# Patient Record
Sex: Female | Born: 1975 | Race: Black or African American | Hispanic: No | Marital: Married | Smoking: Never smoker
Health system: Southern US, Community
[De-identification: ages and names within clinical notes are randomized; demographics above are authoritative.]

## PROBLEM LIST (undated history)

## (undated) DIAGNOSIS — J45909 Unspecified asthma, uncomplicated: Secondary | ICD-10-CM

## (undated) DIAGNOSIS — F32A Depression, unspecified: Secondary | ICD-10-CM

## (undated) DIAGNOSIS — K8020 Calculus of gallbladder without cholecystitis without obstruction: Secondary | ICD-10-CM

## (undated) DIAGNOSIS — D1803 Hemangioma of intra-abdominal structures: Secondary | ICD-10-CM

## (undated) DIAGNOSIS — K219 Gastro-esophageal reflux disease without esophagitis: Secondary | ICD-10-CM

## (undated) DIAGNOSIS — K602 Anal fissure, unspecified: Secondary | ICD-10-CM

## (undated) DIAGNOSIS — K589 Irritable bowel syndrome without diarrhea: Secondary | ICD-10-CM

## (undated) DIAGNOSIS — F329 Major depressive disorder, single episode, unspecified: Secondary | ICD-10-CM

## (undated) DIAGNOSIS — M1990 Unspecified osteoarthritis, unspecified site: Secondary | ICD-10-CM

## (undated) DIAGNOSIS — I10 Essential (primary) hypertension: Secondary | ICD-10-CM

## (undated) DIAGNOSIS — T7840XA Allergy, unspecified, initial encounter: Secondary | ICD-10-CM

## (undated) DIAGNOSIS — R7303 Prediabetes: Secondary | ICD-10-CM

## (undated) DIAGNOSIS — J189 Pneumonia, unspecified organism: Secondary | ICD-10-CM

## (undated) DIAGNOSIS — IMO0002 Reserved for concepts with insufficient information to code with codable children: Secondary | ICD-10-CM

## (undated) DIAGNOSIS — K5790 Diverticulosis of intestine, part unspecified, without perforation or abscess without bleeding: Secondary | ICD-10-CM

## (undated) DIAGNOSIS — K921 Melena: Secondary | ICD-10-CM

## (undated) HISTORY — DX: Gastro-esophageal reflux disease without esophagitis: K21.9

## (undated) HISTORY — DX: Irritable bowel syndrome, unspecified: K58.9

## (undated) HISTORY — DX: Melena: K92.1

## (undated) HISTORY — DX: Unspecified osteoarthritis, unspecified site: M19.90

## (undated) HISTORY — DX: Diverticulosis of intestine, part unspecified, without perforation or abscess without bleeding: K57.90

## (undated) HISTORY — DX: Reserved for concepts with insufficient information to code with codable children: IMO0002

## (undated) HISTORY — DX: Essential (primary) hypertension: I10

## (undated) HISTORY — DX: Unspecified asthma, uncomplicated: J45.909

## (undated) HISTORY — DX: Anal fissure, unspecified: K60.2

## (undated) HISTORY — DX: Major depressive disorder, single episode, unspecified: F32.9

## (undated) HISTORY — PX: WISDOM TOOTH EXTRACTION: SHX21

## (undated) HISTORY — DX: Depression, unspecified: F32.A

## (undated) HISTORY — DX: Allergy, unspecified, initial encounter: T78.40XA

---

## 2002-06-19 ENCOUNTER — Emergency Department (HOSPITAL_COMMUNITY): Admission: EM | Admit: 2002-06-19 | Discharge: 2002-06-19 | Payer: Self-pay | Admitting: Emergency Medicine

## 2002-06-19 ENCOUNTER — Encounter: Payer: Self-pay | Admitting: Emergency Medicine

## 2004-01-19 ENCOUNTER — Emergency Department (HOSPITAL_COMMUNITY): Admission: EM | Admit: 2004-01-19 | Discharge: 2004-01-20 | Payer: Self-pay | Admitting: Emergency Medicine

## 2005-03-14 ENCOUNTER — Other Ambulatory Visit: Admission: RE | Admit: 2005-03-14 | Discharge: 2005-03-14 | Payer: Self-pay | Admitting: Obstetrics and Gynecology

## 2005-09-21 ENCOUNTER — Inpatient Hospital Stay (HOSPITAL_COMMUNITY): Admission: AD | Admit: 2005-09-21 | Discharge: 2005-09-21 | Payer: Self-pay | Admitting: Obstetrics and Gynecology

## 2005-09-21 ENCOUNTER — Inpatient Hospital Stay (HOSPITAL_COMMUNITY): Admission: AD | Admit: 2005-09-21 | Discharge: 2005-09-24 | Payer: Self-pay | Admitting: Obstetrics and Gynecology

## 2005-09-25 ENCOUNTER — Encounter: Admission: RE | Admit: 2005-09-25 | Discharge: 2005-10-24 | Payer: Self-pay | Admitting: Obstetrics and Gynecology

## 2006-12-28 ENCOUNTER — Emergency Department (HOSPITAL_COMMUNITY): Admission: EM | Admit: 2006-12-28 | Discharge: 2006-12-28 | Payer: Self-pay | Admitting: Emergency Medicine

## 2007-09-13 ENCOUNTER — Emergency Department (HOSPITAL_COMMUNITY): Admission: EM | Admit: 2007-09-13 | Discharge: 2007-09-13 | Payer: Self-pay | Admitting: Family Medicine

## 2008-01-11 HISTORY — PX: COLONOSCOPY: SHX174

## 2008-03-10 HISTORY — PX: CERVICAL DISCECTOMY: SHX98

## 2008-08-12 ENCOUNTER — Encounter: Payer: Self-pay | Admitting: Gastroenterology

## 2008-09-02 ENCOUNTER — Ambulatory Visit: Payer: Self-pay | Admitting: Gastroenterology

## 2008-09-02 DIAGNOSIS — K5900 Constipation, unspecified: Secondary | ICD-10-CM | POA: Insufficient documentation

## 2008-09-02 DIAGNOSIS — M129 Arthropathy, unspecified: Secondary | ICD-10-CM | POA: Insufficient documentation

## 2008-09-02 DIAGNOSIS — E669 Obesity, unspecified: Secondary | ICD-10-CM | POA: Insufficient documentation

## 2008-09-02 DIAGNOSIS — K219 Gastro-esophageal reflux disease without esophagitis: Secondary | ICD-10-CM | POA: Insufficient documentation

## 2008-09-02 DIAGNOSIS — K602 Anal fissure, unspecified: Secondary | ICD-10-CM | POA: Insufficient documentation

## 2008-09-10 ENCOUNTER — Ambulatory Visit: Payer: Self-pay | Admitting: Gastroenterology

## 2008-09-11 ENCOUNTER — Encounter: Payer: Self-pay | Admitting: Gastroenterology

## 2008-09-11 LAB — CONVERTED CEMR LAB: UREASE: POSITIVE

## 2010-02-09 NOTE — Letter (Signed)
Summary: Patient [facility] Biopsy Results  [facility]  [address] [place], [place] [address]   Phone: [phone]  Fax: [phone]        September 11, 2008 MRN: [id number]    [name] [address] [place], [place]  [address]    Dear Ms. Lo,  I am pleased to inform you that the biopsies taken during your recent endoscopic examination did not show any evidence of cancer upon pathologic examination. Your stomach biopsies were positive for Helicobacter infection and we will call you for appropriate treatment.  Additional information/recommendations:  __No further action is needed at this time.  Please follow-up with      your primary care physician for your other healthcare needs.  __ Please call [phone] to schedule a return visit to review      your condition.  __ Continue with the treatment plan as outlined on the day of your      exam.  __ You should have a repeat endoscopic examination for this problem              in _ months/years.   Please call us if you are having persistent problems or have questions about your condition that have not been fully answered at this time.  Sincerely,  Mardella Layman MD [facility]  This letter has been electronically signed by your physician.

## 2010-02-09 NOTE — Letter (Signed)
Summary: [employer]  [employer]   Imported By: Lanelle Bal 09/09/2008 11:29:49  _____________________________________________________________________  External Attachment:    Type:   Image     Comment:   External Document

## 2010-02-09 NOTE — Miscellaneous (Signed)
Summary: prevpac  Clinical Lists Changes  Medications: Added new medication of PREVPAC   MISC (AMOXICILL-CLARITHRO-LANSOPRAZ) Take as directed for 10 days - Signed Rx of PREVPAC   MISC (AMOXICILL-CLARITHRO-LANSOPRAZ) Take as directed for 10 days;  #1 x 0;  Signed;  Entered by: Burman Foster RN;  Authorized by: Mardella Layman MD FACG,FAGA;  Method used: Electronically to [facility]  [address] [id number] [address] [place], [place]  [address], Ph: [phone] or [phone], Fax: [phone]    Prescriptions: PREVPAC   MISC (AMOXICILL-CLARITHRO-LANSOPRAZ) Take as directed for 10 days  #1 x 0   Entered by:   Burman Foster RN   Authorized by:   Mardella Layman MD [facility]   Signed by:   Burman Foster RN on 09/11/2008   Method used:   [facility]ronically to        [facility]  [address] [id number] ([facility]ail)       [address]       [place], [place]  [address]       Ph: [phone] or [phone]       Fax: [phone]   RxID:   [id number]

## 2010-05-28 NOTE — Discharge Summary (Signed)
[name], [name]               ACCOUNT NO.:  [id number]   MEDICAL RECORD NO.:  [id number]          PATIENT TYPE:  INP   LOCATION:  9123                          FACILITY:  [facility]   PHYSICIAN:  Sherron Monday, MD        DATE OF BIRTH:  30-May-1975   DATE OF ADMISSION:  09/21/2005  DATE OF DISCHARGE:  09/24/2005                                 DISCHARGE SUMMARY   ADMISSION DIAGNOSIS:  Intrauterine pregnancy at term with cervical change.   DISCHARGE DIAGNOSIS:  Intrauterine pregnancy at term with cervical change,  delivered via spontaneous vaginal delivery.   HISTORY OF PRESENT ILLNESS:  A 35 year old G1 P0 at 65 and 0 weeks with an  [facility] of September 21, 2005 by 9-week ultrasound, presents to labor and  delivery with regular contractions and cervical change to 4 cm.  This is a  change from 2 cm in the office yesterday.  Prenatal care was uncomplicated.   PAST MEDICAL HISTORY:  Nonsignificant.   PAST SURGICAL HISTORY:  Nonsignificant.Marland Kitchen   PAST OB/GYN HISTORY:  Nonsignificant.  G1 is the present pregnancy.   PRENATAL LABORATORY DATA:  AB positive.  Antibody screen negative.  Sickle  cell within normal limits.  RPR nonreactive.  Rubella immune.  Hepatitis B  surface antigen negative.  HIV negative.  Gonorrhea negative.  Chlamydia  negative.  Group B strep negative.  One-hour Glucola of 117.   MEDICATIONS:  None.   ALLERGIES:  SUDAFED.   HOSPITAL COURSE: She was admitted and had an IUPC placed.  Had an amnio  infusion after AROM with meconium-stained fluid and her labor was augmented  with pitocin.  She rapidly progressed to a complete/complete and pushed to  deliver a viable female infant with Apgar's of 8 and 9, and a weight of 7  pounds 12 ounces.  Her postpartum course was uncomplicated.  She was  discharged to home on postpartum day 2 with routine discharge instructions  and numbers to call with any questions or problems.  She was discharged to  home with prescriptions for Motrin  as well as prenatal vitamins.  She will  follow up in the office in 6 weeks for a checkup, and she was interested in  IUD placement for contraception.  Her benefits will be checked for this.  Her hemoglobin decreased from 12 to 10.8 and she was instructed to continue  her prenatal vitamins.      Sherron Monday, MD  Electronically Signed    JB/MEDQ  D:  09/24/2005  T:  09/26/2005  Job:  [id number]

## 2010-06-29 ENCOUNTER — Other Ambulatory Visit: Payer: Self-pay | Admitting: Orthopedic Surgery

## 2010-06-29 DIAGNOSIS — M542 Cervicalgia: Secondary | ICD-10-CM

## 2010-06-30 ENCOUNTER — Ambulatory Visit
Admission: RE | Admit: 2010-06-30 | Discharge: 2010-06-30 | Disposition: A | Discharge status: Home / Self Care | Payer: BC Managed Care – PPO | Source: Ambulatory Visit | Attending: Orthopedic Surgery | Admitting: Orthopedic Surgery

## 2010-06-30 DIAGNOSIS — M542 Cervicalgia: Secondary | ICD-10-CM

## 2010-06-30 MED ORDER — IOHEXOL 300 MG/ML  SOLN
100.0000 mL | Freq: Once | INTRAMUSCULAR | Status: AC | PRN
  Administered 2010-06-30: 100 mL via INTRAVENOUS

## 2010-07-06 ENCOUNTER — Other Ambulatory Visit: Payer: Self-pay | Admitting: Orthopedic Surgery

## 2010-07-06 DIAGNOSIS — M542 Cervicalgia: Secondary | ICD-10-CM

## 2010-07-06 DIAGNOSIS — M79603 Pain in arm, unspecified: Secondary | ICD-10-CM

## 2010-07-08 ENCOUNTER — Inpatient Hospital Stay: Admission: RE | Admit: 2010-07-08 | Payer: BC Managed Care – PPO | Source: Ambulatory Visit

## 2010-07-08 ENCOUNTER — Ambulatory Visit
Admission: RE | Admit: 2010-07-08 | Discharge: 2010-07-08 | Disposition: A | Discharge status: Home / Self Care | Payer: BC Managed Care – PPO | Source: Ambulatory Visit | Attending: Orthopedic Surgery | Admitting: Orthopedic Surgery

## 2010-07-08 ENCOUNTER — Other Ambulatory Visit: Payer: BC Managed Care – PPO

## 2010-07-08 DIAGNOSIS — M79603 Pain in arm, unspecified: Secondary | ICD-10-CM

## 2010-07-08 DIAGNOSIS — M542 Cervicalgia: Secondary | ICD-10-CM

## 2011-06-10 ENCOUNTER — Ambulatory Visit (INDEPENDENT_AMBULATORY_CARE_PROVIDER_SITE_OTHER): Payer: BC Managed Care – PPO | Admitting: Family

## 2011-06-10 ENCOUNTER — Encounter: Payer: Self-pay | Admitting: Family

## 2011-06-10 VITALS — BP 100/70 | Ht 65.25 in | Wt 211.0 lb

## 2011-06-10 DIAGNOSIS — M159 Polyosteoarthritis, unspecified: Secondary | ICD-10-CM

## 2011-06-10 DIAGNOSIS — M545 Low back pain, unspecified: Secondary | ICD-10-CM

## 2011-06-10 DIAGNOSIS — R609 Edema, unspecified: Secondary | ICD-10-CM

## 2011-06-10 DIAGNOSIS — L309 Dermatitis, unspecified: Secondary | ICD-10-CM

## 2011-06-10 DIAGNOSIS — G8929 Other chronic pain: Secondary | ICD-10-CM

## 2011-06-10 DIAGNOSIS — L259 Unspecified contact dermatitis, unspecified cause: Secondary | ICD-10-CM

## 2011-06-10 LAB — TSH: TSH: 1.39 u[IU]/mL (ref 0.35–5.50)

## 2011-06-10 LAB — CBC WITH DIFFERENTIAL/PLATELET
Basophils Absolute: 0 10*3/uL (ref 0.0–0.1)
Eosinophils Absolute: 0.1 10*3/uL (ref 0.0–0.7)
Hemoglobin: 12.6 g/dL (ref 12.0–15.0)
Lymphocytes Relative: 46.8 % — ABNORMAL HIGH (ref 12.0–46.0)
MCHC: 33.5 g/dL (ref 30.0–36.0)
Monocytes Relative: 7.4 % (ref 3.0–12.0)
Neutrophils Relative %: 43.7 % (ref 43.0–77.0)
Platelets: 305 10*3/uL (ref 150.0–400.0)
RDW: 13 % (ref 11.5–14.6)

## 2011-06-10 LAB — COMPREHENSIVE METABOLIC PANEL
ALT: 28 U/L (ref 0–35)
Albumin: 3.9 g/dL (ref 3.5–5.2)
CO2: 26 mEq/L (ref 19–32)
Calcium: 9.2 mg/dL (ref 8.4–10.5)
Chloride: 103 mEq/L (ref 96–112)
Creatinine, Ser: 0.8 mg/dL (ref 0.4–1.2)
GFR: 103.56 mL/min (ref >60.00)
Potassium: 3.9 mEq/L (ref 3.5–5.1)
Total Protein: 7.5 g/dL (ref 6.0–8.3)

## 2011-06-10 LAB — POCT URINALYSIS DIPSTICK
Glucose, UA: NEGATIVE
Ketones, UA: NEGATIVE
Leukocytes, UA: NEGATIVE
pH, UA: 7

## 2011-06-10 MED ORDER — DICLOFENAC SODIUM 75 MG PO TBEC: 75.0000 mg | DELAYED_RELEASE_TABLET | Freq: Two times a day (BID) | ORAL | Status: DC

## 2011-06-10 MED ORDER — TRIAMCINOLONE 0.1 % CREAM:EUCERIN CREAM 1:1: 1.0000 "application " | TOPICAL_CREAM | Freq: Two times a day (BID) | CUTANEOUS | Status: DC

## 2011-06-10 NOTE — Progress Notes (Signed)
Subjective:    Patient ID: [name], female    DOB: Jan 31, 1975, 36 y.o.   MRN: [id number]  HPI 36 year old [place] American female, new patient to the practice is in with complaints of edema bilaterally. The edema is absent in the morning and worsens as the day progresses. She is a moderate amount of sodium intake. She denies any urinary retention, no chest pain, palpitations, shortness of breath or edema. No past medical history of any renal insufficiency or cardiac issues. Patient also has concerns of eczema. She's been off medication for quite some time. She has an appointment to see dermatology next month. However, she's requesting medication for relief.   Review of Systems  Constitutional: Negative.   Eyes: Negative.   Respiratory: Negative.   Cardiovascular: Positive for leg swelling.  Gastrointestinal: Negative.   Genitourinary: Negative.   Musculoskeletal: Negative.   Skin: Positive for rash.       Eczema to neck, left arm and neck  Neurological: Negative.   Hematological: Negative.   Psychiatric/Behavioral: Negative.    Past Medical History  Diagnosis Date  . Arthritis   . Blood in stool   . Depression   . Allergy   . GERD (gastroesophageal reflux disease)   . Ulcer   . Hypertension     History   Social History  . Marital Status: Married    Spouse Name: N/A    Number of Children: N/A  . Years of Education: N/A   Occupational History  . Not on file.   Social History Main Topics  . Smoking status: Never Smoker   . Smokeless tobacco: Not on file  . Alcohol Use: Yes  . Drug Use: No  . Sexually Active:    Other Topics Concern  . Not on file   Social History Narrative  . No narrative on file    History reviewed. No pertinent past surgical history.  History reviewed. No pertinent family history.  Allergies  Allergen Reactions  . Hydrocodone Itching  . Acetaminophen   . Guaifenesin   . Pseudoephedrine     Current Outpatient Prescriptions  on File Prior to Visit  Medication Sig Dispense Refill  . citalopram (CELEXA) 40 MG tablet Take 40 mg by mouth daily.      Marland Kitchen zolpidem (AMBIEN) 10 MG tablet         BP 100/70  Ht 5' 5.25" (1.657 m)  Wt 211 lb (95.709 kg)  BMI 34.84 kg/m2chart    Objective:   Physical Exam  Constitutional: She is oriented to person, place, and time. She appears well-developed and well-nourished.  HENT:  Right Ear: External ear normal.  Left Ear: External ear normal.  Nose: Nose normal.  Mouth/Throat: Oropharynx is clear and moist.  Neck: Normal range of motion. Neck supple.  Cardiovascular: Normal rate, regular rhythm and normal heart sounds.   Pulmonary/Chest: Effort normal and breath sounds normal.  Abdominal: Soft. Bowel sounds are normal.  Musculoskeletal: Normal range of motion.  Neurological: She is alert and oriented to person, place, and time.  Skin: Skin is warm and dry. Rash noted.       Dry, flaky rash noted to the anterior and posterior neck. Left lateral forearm.  Psychiatric: She has a normal mood and affect.          Assessment & Plan:  Assessment: Eczema, peripheral edema-likely related to sodium retention  Plan: Lab sent to include UA, CBC, CMP, TSH per patient pending results. Eucerin and triamcinolone 0.1% one-to-one mix  applied to the affected areas twice daily. Bring patient back for complete physical exam for Pap smear in the next couple weeks. [facility]

## 2011-06-10 NOTE — Patient Instructions (Signed)
Eczema Atopic dermatitis, or eczema, is an inherited type of sensitive skin. Often people with eczema have a family history of allergies, asthma, or hay fever. It causes a red itchy rash and dry scaly skin. The itchiness may occur before the skin rash and may be very intense. It is not contagious. Eczema is generally worse during the cooler winter months and often improves with the warmth of summer. Eczema usually starts showing signs in infancy. Some children outgrow eczema, but it may last through adulthood. Flare-ups may be caused by:  Eating something or contact with something you are sensitive or allergic to.   Stress.  DIAGNOSIS  The diagnosis of eczema is usually based upon symptoms and medical history. TREATMENT  Eczema cannot be cured, but symptoms usually can be controlled with treatment or avoidance of allergens (things to which you are sensitive or allergic to).  Controlling the itching and scratching.   Use over-the-counter antihistamines as directed for itching. It is especially useful at night when the itching tends to be worse.   Use over-the-counter steroid creams as directed for itching.   Scratching makes the rash and itching worse and may cause impetigo (a skin infection) if fingernails are contaminated (dirty).   Keeping the skin well moisturized with creams every day. This will seal in moisture and help prevent dryness. Lotions containing alcohol and water can dry the skin and are not recommended.   Limiting exposure to allergens.   Recognizing situations that cause stress.   Developing a plan to manage stress.  HOME CARE INSTRUCTIONS   Take prescription and over-the-counter medicines as directed by your caregiver.   Do not use anything on the skin without checking with your caregiver.   Keep baths or showers short (5 minutes) in warm (not hot) water. Use mild cleansers for bathing. You may add non-perfumed bath oil to the bath water. It is best to avoid soap and  bubble bath.   Immediately after a bath or shower, when the skin is still damp, apply a moisturizing ointment to the entire body. This ointment should be a petroleum ointment. This will seal in moisture and help prevent dryness. The thicker the ointment the better. These should be unscented.   Keep fingernails cut short and wash hands often. If your child has eczema, it may be necessary to put soft gloves or mittens on your child at night.   Dress in clothes made of cotton or cotton blends. Dress lightly, as heat increases itching.   Avoid foods that may cause flare-ups. Common foods include cow's milk, peanut butter, eggs and wheat.   Keep a child with eczema away from anyone with fever blisters. The virus that causes fever blisters (herpes simplex) can cause a serious skin infection in children with eczema.  SEEK MEDICAL CARE IF:   Itching interferes with sleep.   The rash gets worse or is not better within one week following treatment.   The rash looks infected (pus or soft yellow scabs).   You or your child has an oral temperature above 102 F (38.9 C).   Your baby is older than 3 months with a rectal temperature of 100.5 F (38.1 C) or higher for more than 1 day.   The rash flares up after contact with someone who has fever blisters.  SEEK IMMEDIATE MEDICAL CARE IF:   Your baby is older than 3 months with a rectal temperature of 102 F (38.9 C) or higher.   Your baby is older than  3 months or younger with a rectal temperature of 100.4 F (38 C) or higher.  Document Released: 12/25/1999 Document Revised: 12/16/2010 Document Reviewed: 10/29/2008 [facility] Patient Information 2012 [place], [place].  Sodium-Controlled Diet Sodium is a mineral. It is found in many foods. Sodium may be found naturally or added during the making of a food. The most common form of sodium is salt, which is made up of sodium and chloride. Reducing your sodium intake involves changing your eating  habits. The following guidelines will help you reduce the sodium in your diet:  Stop using the salt shaker.   Use salt sparingly in cooking and baking.   Substitute with sodium-free seasonings and spices.   Do not use a salt substitute (potassium chloride) without your caregiver's permission.   Include a variety of fresh, unprocessed foods in your diet.   Limit the use of processed and convenience foods that are high in sodium.  USE THE FOLLOWING FOODS SPARINGLY: Breads/Starches  Commercial bread stuffing, commercial pancake or waffle mixes, coating mixes. Waffles. Croutons. Prepared (boxed or frozen) potato, rice, or noodle mixes that contain salt or sodium. Salted [place] fries or hash browns. Salted popcorn, breads, crackers, chips, or snack foods.  Vegetables  Vegetables canned with salt or prepared in cream, butter, or cheese sauces. Sauerkraut. Tomato or vegetable juices canned with salt.   Fresh vegetables are allowed if rinsed thoroughly.  Fruit  Fruit is okay to eat.  Meat and Meat Substitutes  Salted or smoked meats, such as bacon or Canadian bacon, chipped or corned beef, hot dogs, salt pork, luncheon meats, pastrami, ham, or sausage. Canned or smoked fish, poultry, or meat. Processed cheese or cheese spreads, blue or Roquefort cheese. Battered or frozen fish products. Prepared spaghetti sauce. Baked beans. Reuben sandwiches. Salted nuts. Caviar.  Milk  Limit buttermilk to 1 cup per week.  Soups and Combination Foods  Bouillon cubes, canned or dried soups, broth, consomm. Convenience (frozen or packaged) dinners with more than 600 mg sodium. Pot pies, pizza, Asian food, fast food cheeseburgers, and specialty sandwiches.  Desserts and Sweets  Regular (salted) desserts, pie, commercial fruit snack pies, commercial snack cakes, canned puddings.   Eat desserts and sweets in moderation.  Fats and Oils  Gravy mixes or canned gravy. No more than 1 to 2 tbs of salad  dressing. Chip dips.   Eat fats and oils in moderation.  Beverages  See those listed under the vegetables and milk groups.  Condiments  Ketchup, mustard, meat sauces, salsa, regular (salted) and lite soy sauce or mustard. Dill pickles, olives, meat tenderizer. Prepared horseradish or pickle relish. Dutch-processed cocoa. Baking powder or baking soda used medicinally. Worcestershire sauce. "Light" salt. Salt substitute, unless approved by your caregiver.  Document Released: 06/18/2001 Document Revised: 12/16/2010 Document Reviewed: 01/19/2009 [facility] Patient Information 2012 [place], [place].

## 2011-07-04 ENCOUNTER — Other Ambulatory Visit: Payer: BC Managed Care – PPO

## 2011-07-05 ENCOUNTER — Other Ambulatory Visit (INDEPENDENT_AMBULATORY_CARE_PROVIDER_SITE_OTHER): Payer: BC Managed Care – PPO

## 2011-07-05 DIAGNOSIS — Z0000 Encounter for general adult medical examination without abnormal findings: Secondary | ICD-10-CM

## 2011-07-05 LAB — POCT URINALYSIS DIPSTICK
Bilirubin, UA: NEGATIVE
Glucose, UA: NEGATIVE
Leukocytes, UA: NEGATIVE
Nitrite, UA: NEGATIVE
Urobilinogen, UA: 0.2

## 2011-07-05 LAB — CBC WITH DIFFERENTIAL/PLATELET
Basophils Relative: 0.6 % (ref 0.0–3.0)
Eosinophils Relative: 1.4 % (ref 0.0–5.0)
HCT: 36.8 % (ref 36.0–46.0)
Lymphs Abs: 2 10*3/uL (ref 0.7–4.0)
MCV: 95.2 fl (ref 78.0–100.0)
Monocytes Absolute: 0.4 10*3/uL (ref 0.1–1.0)
Monocytes Relative: 8.1 % (ref 3.0–12.0)
Platelets: 323 10*3/uL (ref 150.0–400.0)
RBC: 3.86 Mil/uL — ABNORMAL LOW (ref 3.87–5.11)
WBC: 4.4 10*3/uL — ABNORMAL LOW (ref 4.5–10.5)

## 2011-07-05 LAB — HEPATIC FUNCTION PANEL
ALT: 24 U/L (ref 0–35)
AST: 23 U/L (ref 0–37)
Bilirubin, Direct: 0 mg/dL (ref 0.0–0.3)
Total Bilirubin: 0.5 mg/dL (ref 0.3–1.2)
Total Protein: 7.5 g/dL (ref 6.0–8.3)

## 2011-07-05 LAB — LDL CHOLESTEROL, DIRECT: Direct LDL: 131.2 mg/dL

## 2011-07-05 LAB — BASIC METABOLIC PANEL
Chloride: 105 mEq/L (ref 96–112)
GFR: 116.72 mL/min (ref >60.00)
Potassium: 3.9 mEq/L (ref 3.5–5.1)
Sodium: 137 mEq/L (ref 135–145)

## 2011-07-05 LAB — LIPID PANEL
Cholesterol: 208 mg/dL — ABNORMAL HIGH (ref 0–200)
Total CHOL/HDL Ratio: 4
VLDL: 16.6 mg/dL (ref 0.0–40.0)

## 2011-07-05 LAB — TSH: TSH: 2.02 u[IU]/mL (ref 0.35–5.50)

## 2011-07-11 ENCOUNTER — Encounter: Payer: Self-pay | Admitting: Family

## 2011-07-11 ENCOUNTER — Ambulatory Visit (INDEPENDENT_AMBULATORY_CARE_PROVIDER_SITE_OTHER): Payer: BC Managed Care – PPO | Admitting: Family

## 2011-07-11 ENCOUNTER — Encounter: Payer: BC Managed Care – PPO | Admitting: Family

## 2011-07-11 VITALS — BP 100/80 | Temp 98.3°F | Wt 218.0 lb

## 2011-07-11 DIAGNOSIS — M79609 Pain in unspecified limb: Secondary | ICD-10-CM

## 2011-07-11 DIAGNOSIS — Z0000 Encounter for general adult medical examination without abnormal findings: Secondary | ICD-10-CM

## 2011-07-11 DIAGNOSIS — M79671 Pain in right foot: Secondary | ICD-10-CM

## 2011-07-11 NOTE — Patient Instructions (Signed)

## 2011-07-11 NOTE — Progress Notes (Signed)
Subjective:    Patient ID: [name], female    DOB: [birth year], 36 y.o.   MRN: [id number]  HPI 36 year old African American female, nonsmoker is in for complete physical exam. She has complaints of bilateral ankle pain is worse with walking. Pain has worsened over the last couple days that has been present for several months. She describes the pain as achy rates it as 6/10.  This is a routine physical examination for this healthy  Female. Reviewed all health maintenance protocols including reviewed appropriate screening labs. Her immunization history was reviewed as well as her current medications and allergies refills of her chronic medications were given and the plan for yearly health maintenance was discussed all orders and referrals were made as appropriate.   Review of Systems  Constitutional: Negative.   HENT: Negative.   Eyes: Negative.   Respiratory: Negative.   Cardiovascular: Negative.   Gastrointestinal: Negative.   Genitourinary: Negative.   Musculoskeletal: Negative.   Skin: Negative.   Neurological: Negative.   Hematological: Negative.   Psychiatric/Behavioral: Negative.    Past Medical History  Diagnosis Date  . Arthritis   . Blood in stool   . Depression   . Allergy   . GERD (gastroesophageal reflux disease)   . Ulcer   . Hypertension     History   Social History  . Marital Status: Married    Spouse Name: N/A    Number of Children: N/A  . Years of Education: N/A   Occupational History  . Not on file.   Social History Main Topics  . Smoking status: Never Smoker   . Smokeless tobacco: Not on file  . Alcohol Use: Yes  . Drug Use: No  . Sexually Active:    Other Topics Concern  . Not on file   Social History Narrative  . No narrative on file    No past surgical history on file.  No family history on file.  Allergies  Allergen Reactions  . Hydrocodone Itching  . Acetaminophen   . Guaifenesin   . Pseudoephedrine     Current  Outpatient Prescriptions on File Prior to Visit  Medication Sig Dispense Refill  . citalopram (CELEXA) 40 MG tablet Take 40 mg by mouth daily.      . cyclobenzaprine (FLEXERIL) 10 MG tablet Take 10 mg by mouth daily as needed.      . diclofenac (VOLTAREN) 75 MG EC tablet Take 1 tablet (75 mg total) by mouth 2 (two) times daily.  30 tablet  0  . etodolac (LODINE) 400 MG tablet Take 400 mg by mouth 2 (two) times daily as needed.      . fish oil-omega-3 fatty acids 1000 MG capsule Take 2 g by mouth daily.      . montelukast (SINGULAIR) 10 MG tablet       . Multiple Vitamin (MULTIVITAMIN) tablet Take 1 tablet by mouth daily.      Marland Kitchen NASONEX 50 MCG/ACT nasal spray       . pantoprazole (PROTONIX) 40 MG tablet       . PROVENTIL HFA 108 (90 BASE) MCG/ACT inhaler       . traMADol (ULTRAM) 50 MG tablet Take 50 mg by mouth every 6 (six) hours as needed.      . Triamcinolone Acetonide (TRIAMCINOLONE 0.1 % CREAM : EUCERIN) CREA Apply 1 application topically 2 (two) times daily.  1 each  3  . zolpidem (AMBIEN) 10 MG tablet  BP 100/80  Temp 98.3 F (36.8 C) (Oral)  Wt 218 lb (98.884 kg)chart    Objective:   Physical Exam  Constitutional: She is oriented to person, place, and time. She appears well-developed and well-nourished.  HENT:  Head: Normocephalic and atraumatic.  Right Ear: External ear normal.  Mouth/Throat: Oropharynx is clear and moist.  Eyes: Conjunctivae and EOM are normal. Pupils are equal, round, and reactive to light.  Neck: Normal range of motion. Neck supple. No thyromegaly present.  Cardiovascular: Normal rate, regular rhythm, normal heart sounds and intact distal pulses.  Exam reveals no gallop and no friction rub.   No murmur heard. Pulmonary/Chest: Effort normal and breath sounds normal.  Abdominal: Soft. Bowel sounds are normal.  Genitourinary:       Deferred to GYN   Musculoskeletal: Normal range of motion.  Neurological: She is alert and oriented to person,  place, and time. She has normal reflexes.  Skin: Skin is warm and dry.  Psychiatric: She has a normal mood and affect.          Assessment & Plan:  Assessment: Complete physical exam, bilateral ankle pain  Plan: X-ray of the right ankle since it's the worse one. Resume diclofenac 75 mg twice daily with a full meal. Patient to call the office if symptoms worsen or persist. Discussed weight reduction, low cholesterol diet, low-sodium diet. Followup with gynecology as scheduled and sooner when necessary.

## 2011-07-12 ENCOUNTER — Ambulatory Visit (INDEPENDENT_AMBULATORY_CARE_PROVIDER_SITE_OTHER)
Admission: RE | Admit: 2011-07-12 | Discharge: 2011-07-12 | Disposition: A | Discharge status: Home / Self Care | Payer: BC Managed Care – PPO | Source: Ambulatory Visit | Attending: Family | Admitting: Family

## 2011-07-12 DIAGNOSIS — M79672 Pain in left foot: Secondary | ICD-10-CM

## 2011-07-12 DIAGNOSIS — M79609 Pain in unspecified limb: Secondary | ICD-10-CM

## 2011-07-12 DIAGNOSIS — M79671 Pain in right foot: Secondary | ICD-10-CM

## 2011-12-31 ENCOUNTER — Encounter (HOSPITAL_COMMUNITY): Payer: Self-pay | Admitting: Family Medicine

## 2011-12-31 ENCOUNTER — Emergency Department (HOSPITAL_COMMUNITY)
Admission: EM | Admit: 2011-12-31 | Discharge: 2011-12-31 | Disposition: A | Discharge status: Home / Self Care | Payer: BC Managed Care – PPO | Attending: Emergency Medicine | Admitting: Emergency Medicine

## 2011-12-31 DIAGNOSIS — Z79899 Other long term (current) drug therapy: Secondary | ICD-10-CM | POA: Insufficient documentation

## 2011-12-31 DIAGNOSIS — Z8659 Personal history of other mental and behavioral disorders: Secondary | ICD-10-CM | POA: Insufficient documentation

## 2011-12-31 DIAGNOSIS — R197 Diarrhea, unspecified: Secondary | ICD-10-CM | POA: Insufficient documentation

## 2011-12-31 DIAGNOSIS — Z8719 Personal history of other diseases of the digestive system: Secondary | ICD-10-CM | POA: Insufficient documentation

## 2011-12-31 DIAGNOSIS — I10 Essential (primary) hypertension: Secondary | ICD-10-CM | POA: Insufficient documentation

## 2011-12-31 DIAGNOSIS — IMO0002 Reserved for concepts with insufficient information to code with codable children: Secondary | ICD-10-CM | POA: Insufficient documentation

## 2011-12-31 DIAGNOSIS — R110 Nausea: Secondary | ICD-10-CM | POA: Insufficient documentation

## 2011-12-31 DIAGNOSIS — Z8739 Personal history of other diseases of the musculoskeletal system and connective tissue: Secondary | ICD-10-CM | POA: Insufficient documentation

## 2011-12-31 DIAGNOSIS — Z791 Long term (current) use of non-steroidal anti-inflammatories (NSAID): Secondary | ICD-10-CM | POA: Insufficient documentation

## 2011-12-31 LAB — CBC WITH DIFFERENTIAL/PLATELET
Basophils Absolute: 0 10*3/uL (ref 0.0–0.1)
Eosinophils Relative: 2 % (ref 0–5)
Lymphocytes Relative: 55 % — ABNORMAL HIGH (ref 12–46)
Lymphs Abs: 1.5 10*3/uL (ref 0.7–4.0)
Monocytes Relative: 10 % (ref 3–12)
Neutrophils Relative %: 33 % — ABNORMAL LOW (ref 43–77)
Platelets: 330 10*3/uL (ref 150–400)
RBC: 4.25 MIL/uL (ref 3.87–5.11)
RDW: 12.5 % (ref 11.5–15.5)
WBC: 2.9 10*3/uL — ABNORMAL LOW (ref 4.0–10.5)

## 2011-12-31 LAB — COMPREHENSIVE METABOLIC PANEL
ALT: 21 U/L (ref 0–35)
AST: 20 U/L (ref 0–37)
Albumin: 3.9 g/dL (ref 3.5–5.2)
Chloride: 101 mEq/L (ref 96–112)
Creatinine, Ser: 0.95 mg/dL (ref 0.50–1.10)
Potassium: 3.6 mEq/L (ref 3.5–5.1)
Sodium: 136 mEq/L (ref 135–145)
Total Bilirubin: 0.8 mg/dL (ref 0.3–1.2)

## 2011-12-31 LAB — URINALYSIS, ROUTINE W REFLEX MICROSCOPIC
Glucose, UA: NEGATIVE mg/dL
Ketones, ur: 15 mg/dL — AB
pH: 5.5 (ref 5.0–8.0)

## 2011-12-31 LAB — URINE MICROSCOPIC-ADD ON

## 2011-12-31 MED ORDER — ONDANSETRON HCL 4 MG PO TABS: 4.0000 mg | ORAL_TABLET | Freq: Four times a day (QID) | ORAL | Status: DC

## 2011-12-31 NOTE — ED Provider Notes (Signed)
History     CSN: [id number]  Arrival date & time 12/31/11  1050   First MD Initiated Contact with Patient 12/31/11 1225      Chief Complaint  Patient presents with  . Abdominal Pain    (Consider location/radiation/quality/duration/timing/severity/associated sxs/prior treatment) Patient is a 36 y.o. female presenting with abdominal pain. The history is provided by the patient.  Abdominal Pain The primary symptoms of the illness include abdominal pain, nausea and diarrhea. The primary symptoms of the illness do not include fever, shortness of breath, vomiting, dysuria or vaginal discharge.  Symptoms associated with the illness do not include chills. Associated symptoms comments: Diarrhea that started 2 days ago and has been persistent. No fever, vomiting, BM's with blood but no mucus..    Past Medical History  Diagnosis Date  . Arthritis   . Blood in stool   . Depression   . Allergy   . GERD (gastroesophageal reflux disease)   . Ulcer   . Hypertension     History reviewed. No pertinent past surgical history.  History reviewed. No pertinent family history.  History  Substance Use Topics  . Smoking status: Never Smoker   . Smokeless tobacco: Not on file  . Alcohol Use: Yes    OB History    Grav Para Term Preterm Abortions TAB SAB Ect Mult Living                  Review of Systems  Constitutional: Negative for fever and chills.  Respiratory: Negative.  Negative for shortness of breath.   Cardiovascular: Negative.  Negative for chest pain.  Gastrointestinal: Positive for nausea, abdominal pain and diarrhea. Negative for vomiting.  Genitourinary: Negative for dysuria and vaginal discharge.  Musculoskeletal: Negative.  Negative for myalgias.  Skin: Negative.  Negative for rash.  Neurological: Negative.  Negative for dizziness.    Allergies  Hydrocodone; Acetaminophen; Guaifenesin; and Pseudoephedrine  Home Medications   Current Outpatient Rx  Name  Route   Sig  Dispense  Refill  . CYCLOBENZAPRINE HCL 10 MG PO TABS   Oral   Take 10 mg by mouth daily as needed.         Marland Kitchen CLODERM PUMP 0.1 % EX CREA               . DICLOFENAC SODIUM 75 MG PO TBEC   Oral   Take 1 tablet (75 mg total) by mouth 2 (two) times daily.   30 tablet   0   . ETODOLAC 400 MG PO TABS   Oral   Take 400 mg by mouth 2 (two) times daily as needed.         . OMEGA-3 FATTY ACIDS 1000 MG PO CAPS   Oral   Take 2 g by mouth daily.         . MELOXICAM 15 MG PO TABS   Oral   Take 15 mg by mouth.          Marland Kitchen MONTELUKAST SODIUM 10 MG PO TABS               . ONE-DAILY MULTI VITAMINS PO TABS   Oral   Take 1 tablet by mouth daily.         Marland Kitchen NASONEX 50 MCG/ACT NA SUSP               . PANTOPRAZOLE SODIUM 40 MG PO TBEC               . PROVENTIL  HFA 108 (90 BASE) MCG/ACT IN AERS               . TRAMADOL HCL 50 MG PO TABS   Oral   Take 50 mg by mouth every 6 (six) hours as needed.         . TRIAMCINOLONE 0.1 % CREAM:EUCERIN CREAM 1:1   Topical   Apply 1 application topically 2 (two) times daily.   1 each   3   . ZOLPIDEM TARTRATE 10 MG PO TABS                 BP 134/85  Pulse 73  Temp 97.6 F (36.4 C) (Oral)  Resp 16  SpO2 98%  LMP 12/30/2011  Physical Exam  Constitutional: She is oriented to person, place, and time. She appears well-developed and well-nourished.  HENT:  Head: Normocephalic.  Neck: Normal range of motion. Neck supple.  Cardiovascular: Normal rate and regular rhythm.   Pulmonary/Chest: Effort normal and breath sounds normal.  Abdominal: Soft. There is no rebound and no guarding.       Diffuse tenderness to soft abdomen without mass.   Musculoskeletal: Normal range of motion.  Neurological: She is alert and oriented to person, place, and time.  Skin: Skin is warm and dry. No rash noted.  Psychiatric: She has a normal mood and affect.    ED Course  Procedures (including critical care time)  Labs  Reviewed  CBC WITH DIFFERENTIAL - Abnormal; Notable for the following:    WBC 2.9 (*)     Neutrophils Relative 33 (*)     Lymphocytes Relative 55 (*)     Neutro Abs 1.0 (*)     All other components within normal limits  COMPREHENSIVE METABOLIC PANEL - Abnormal; Notable for the following:    GFR calc non Af Amer 77 (*)     GFR calc Af Amer 89 (*)     All other components within normal limits  LIPASE, BLOOD  URINALYSIS, ROUTINE W REFLEX MICROSCOPIC   Results for orders placed during the hospital encounter of 12/31/11  CBC WITH DIFFERENTIAL      Component Value Range   WBC 2.9 (*) 4.0 - 10.5 K/uL   RBC 4.25  3.87 - 5.11 MIL/uL   Hemoglobin 13.2  12.0 - 15.0 g/dL   HCT 16.1  09.6 - 04.5 %   MCV 92.2  78.0 - 100.0 fL   MCH 31.1  26.0 - 34.0 pg   MCHC 33.7  30.0 - 36.0 g/dL   RDW 40.9  81.1 - 91.4 %   Platelets 330  150 - 400 K/uL   Neutrophils Relative 33 (*) 43 - 77 %   Lymphocytes Relative 55 (*) 12 - 46 %   Monocytes Relative 10  3 - 12 %   Eosinophils Relative 2  0 - 5 %   Basophils Relative 0  0 - 1 %   Neutro Abs 1.0 (*) 1.7 - 7.7 K/uL   Lymphs Abs 1.5  0.7 - 4.0 K/uL   Monocytes Absolute 0.3  0.1 - 1.0 K/uL   Eosinophils Absolute 0.1  0.0 - 0.7 K/uL   Basophils Absolute 0.0  0.0 - 0.1 K/uL   Smear Review MORPHOLOGY UNREMARKABLE    COMPREHENSIVE METABOLIC PANEL      Component Value Range   Sodium 136  135 - 145 mEq/L   Potassium 3.6  3.5 - 5.1 mEq/L   Chloride 101  96 - 112 mEq/L  CO2 22  19 - 32 mEq/L   Glucose, Bld 91  70 - 99 mg/dL   BUN 6  6 - 23 mg/dL   Creatinine, Ser 5.78  0.50 - 1.10 mg/dL   Calcium 9.3  8.4 - 46.9 mg/dL   Total Protein 8.3  6.0 - 8.3 g/dL   Albumin 3.9  3.5 - 5.2 g/dL   AST 20  0 - 37 U/L   ALT 21  0 - 35 U/L   Alkaline Phosphatase 68  39 - 117 U/L   Total Bilirubin 0.8  0.3 - 1.2 mg/dL   GFR calc non Af Amer 77 (*) >90 mL/min   GFR calc Af Amer 89 (*) >90 mL/min  LIPASE, BLOOD      Component Value Range   Lipase 24  11 - 59 U/L   URINALYSIS, ROUTINE W REFLEX MICROSCOPIC      Component Value Range   Color, Urine RED (*) YELLOW   APPearance TURBID (*) CLEAR   Specific Gravity, Urine 1.025  1.005 - 1.030   pH 5.5  5.0 - 8.0   Glucose, UA NEGATIVE  NEGATIVE mg/dL   Hgb urine dipstick LARGE (*) NEGATIVE   Bilirubin Urine MODERATE (*) NEGATIVE   Ketones, ur 15 (*) NEGATIVE mg/dL   Protein, ur 629 (*) NEGATIVE mg/dL   Urobilinogen, UA 1.0  0.0 - 1.0 mg/dL   Nitrite NEGATIVE  NEGATIVE   Leukocytes, UA MODERATE (*) NEGATIVE  URINE MICROSCOPIC-ADD ON      Component Value Range   Squamous Epithelial / LPF FEW (*) RARE   WBC, UA 21-50  <3 WBC/hpf   RBC / HPF TOO NUMEROUS TO COUNT  <3 RBC/hpf    No results found.   No diagnosis found. 1. Diarrhea    MDM  Symptoms limited to multiple episodes of diarrhea with small quantity of blood, nml hgb and VSS. Abdomen benign. Suspect viral or possibly food related illness. Will treat symptomatically and recommend PCP follow up.        Rodena Medin, PA-C 12/31/11 1312

## 2011-12-31 NOTE — ED Notes (Signed)
Per pt sts epigastric pain when she eats something. sts also sharp pains in RUQ area going into back. sts blood in stool and diarrhea.

## 2011-12-31 NOTE — ED Notes (Signed)
Pt placed in gown.

## 2011-12-31 NOTE — ED Provider Notes (Signed)
Medical screening examination/treatment/procedure(s) were performed by non-physician practitioner and as supervising physician I was immediately available for consultation/collaboration.   Dione Booze, MD 12/31/11 [phone]

## 2012-02-17 ENCOUNTER — Ambulatory Visit (INDEPENDENT_AMBULATORY_CARE_PROVIDER_SITE_OTHER): Payer: BC Managed Care – PPO | Admitting: Family

## 2012-02-17 ENCOUNTER — Encounter: Payer: BC Managed Care – PPO | Admitting: Family Medicine

## 2012-02-17 ENCOUNTER — Encounter: Payer: Self-pay | Admitting: Family

## 2012-02-17 VITALS — BP 120/84 | HR 80 | Temp 98.4°F | Wt 202.0 lb

## 2012-02-17 DIAGNOSIS — R1013 Epigastric pain: Secondary | ICD-10-CM

## 2012-02-17 DIAGNOSIS — K219 Gastro-esophageal reflux disease without esophagitis: Secondary | ICD-10-CM

## 2012-02-17 LAB — HEPATIC FUNCTION PANEL
ALT: 23 U/L (ref 0–35)
AST: 20 U/L (ref 0–37)
Bilirubin, Direct: 0 mg/dL (ref 0.0–0.3)
Total Protein: 8.5 g/dL — ABNORMAL HIGH (ref 6.0–8.3)

## 2012-02-17 MED ORDER — ESOMEPRAZOLE MAGNESIUM 40 MG PO CPDR: 40.0000 mg | DELAYED_RELEASE_CAPSULE | Freq: Every day | ORAL | Status: DC

## 2012-02-17 NOTE — Progress Notes (Signed)
NO SHOW. This encounter was created in error - please disregard.

## 2012-02-17 NOTE — Progress Notes (Signed)
Subjective:    Patient ID: [name], female    DOB: 06-25-1975, 37 y.o.   MRN: [id number]  HPI 37 year old African American female, nonsmoker is in with complaints of upper abdominal pain x 1 week. Patient reports the pain worse with eating. Describes it as sharp, radiating it 8/10. Has increased belching and burping recently. Is decreased appetite. Patient takes diclofenac as needed for left knee pain. Has discontinued taking protonix that has not working as effectively as it once was. Patient has also been on Zantac in the past. She has a history of GERD. She was seen by GI and 2010 and had an endoscopy performed that showed mild gastritis, otherwise normal. Patient reports certain foods cause of abdominal pain particularly hot sauce.   Review of Systems  Constitutional: Negative.   Respiratory: Negative.   Cardiovascular: Negative.   Gastrointestinal: Positive for nausea and abdominal pain. Negative for vomiting.  Musculoskeletal: Negative.   Skin: Negative.   Hematological: Negative.   Psychiatric/Behavioral: Negative.    Past Medical History  Diagnosis Date  . Arthritis   . Blood in stool   . Depression   . Allergy   . GERD (gastroesophageal reflux disease)   . Ulcer   . Hypertension     History   Social History  . Marital Status: Married    Spouse Name: N/A    Number of Children: N/A  . Years of Education: N/A   Occupational History  . Not on file.   Social History Main Topics  . Smoking status: Never Smoker   . Smokeless tobacco: Not on file  . Alcohol Use: Yes  . Drug Use: No  . Sexually Active:    Other Topics Concern  . Not on file   Social History Narrative  . No narrative on file    No past surgical history on file.  No family history on file.  Allergies  Allergen Reactions  . Hydrocodone Itching  . Acetaminophen Hives and Itching  . Guaifenesin Hives and Itching  . Pseudoephedrine Hives, Itching and Other (See Comments)    Burning     Current Outpatient Prescriptions on File Prior to Visit  Medication Sig Dispense Refill  . CLODERM PUMP 0.1 % cream       . cyclobenzaprine (FLEXERIL) 10 MG tablet Take 10 mg by mouth daily as needed.      . fish oil-omega-3 fatty acids 1000 MG capsule Take 2 g by mouth daily.      . montelukast (SINGULAIR) 10 MG tablet       . NASONEX 50 MCG/ACT nasal spray Place 2 sprays into alternate nostrils daily.       Marland Kitchen PROVENTIL HFA 108 (90 BASE) MCG/ACT inhaler Inhale 3 puffs into the lungs as needed.       . Triamcinolone Acetonide (TRIAMCINOLONE 0.1 % CREAM : EUCERIN) CREA Apply 1 application topically 2 (two) times daily.  1 each  3  . zolpidem (AMBIEN) 10 MG tablet       . esomeprazole (NEXIUM) 40 MG capsule Take 1 capsule (40 mg total) by mouth daily.  30 capsule  3  . ondansetron (ZOFRAN) 4 MG tablet Take 1 tablet (4 mg total) by mouth every 6 (six) hours.  12 tablet  0    BP 120/84  Pulse 80  Temp 98.4 F (36.9 C) (Oral)  Wt 202 lb (91.627 kg)  SpO2 98%chart    Objective:   Physical Exam  Constitutional: She is oriented to person,  place, and time. She appears well-developed and well-nourished.  HENT:  Right Ear: External ear normal.  Left Ear: External ear normal.  Mouth/Throat: Oropharynx is clear and moist.  Neck: Normal range of motion. Neck supple.  Cardiovascular: Normal rate, regular rhythm and normal heart sounds.   Pulmonary/Chest: Effort normal and breath sounds normal.  Abdominal: Soft. Bowel sounds are normal. There is tenderness. There is no rebound and no guarding.       Tenderness to palpation of the epigastric region  Neurological: She is alert and oriented to person, place, and time.  Skin: Skin is warm and dry.  Psychiatric: She has a normal mood and affect.          Assessment & Plan:  Assessment: 1. GERD 2. Epigastric pain  Plan: LFTs sent. DC protonix since. DC diclofenac oral. GERD friendly diet. Start Nexium 40 mg once daily. We'll follow  with patient in the results of her labs and as needed. If her LFTs are elevated, we'll order a gallbladder ultrasound.

## 2012-02-17 NOTE — Patient Instructions (Signed)
Diet for Gastroesophageal Reflux Disease, Adult  Reflux (acid reflux) is when acid from your stomach flows up into the esophagus. When acid comes in contact with the esophagus, the acid causes irritation and soreness (inflammation) in the esophagus. When reflux happens often or so severely that it causes damage to the esophagus, it is called gastroesophageal reflux disease (GERD). Nutrition therapy can help ease the discomfort of GERD.  FOODS OR DRINKS TO AVOID OR LIMIT   Smoking or chewing tobacco. Nicotine is one of the most potent stimulants to acid production in the gastrointestinal tract.   Caffeinated and decaffeinated coffee and black tea.   Regular or low-calorie carbonated beverages or energy drinks (caffeine-free carbonated beverages are allowed).    Strong spices, such as black pepper, white pepper, red pepper, cayenne, curry powder, and chili powder.   Peppermint or spearmint.   Chocolate.   High-fat foods, including meats and fried foods. Extra added fats including oils, butter, salad dressings, and nuts. Limit these to less than 8 tsp per day.   Fruits and vegetables if they are not tolerated, such as citrus fruits or tomatoes.   Alcohol.   Any food that seems to aggravate your condition.  If you have questions regarding your diet, call your caregiver or a registered dietitian.  OTHER THINGS THAT MAY HELP GERD INCLUDE:    Eating your meals slowly, in a relaxed setting.   Eating 5 to 6 small meals per day instead of 3 large meals.   Eliminating food for a period of time if it causes distress.   Not lying down until 3 hours after eating a meal.   Keeping the head of your bed raised 6 to 9 inches (15 to 23 cm) by using a foam wedge or blocks under the legs of the bed. Lying flat may make symptoms worse.   Being physically active. Weight loss may be helpful in reducing reflux in overweight or obese adults.   Wear loose fitting clothing  EXAMPLE MEAL PLAN  This meal plan is approximately  2,000 calories based on ChooseMyPlate.gov meal planning guidelines.  Breakfast    cup cooked oatmeal.   1 cup strawberries.   1 cup low-fat milk.   1 oz almonds.  Snack   1 cup cucumber slices.   6 oz yogurt (made from low-fat or fat-free milk).  Lunch   2 slice whole-wheat bread.   2 oz sliced turkey.   2 tsp mayonnaise.   1 cup blueberries.   1 cup snap peas.  Snack   6 whole-wheat crackers.   1 oz string cheese.  Dinner    cup brown rice.   1 cup mixed veggies.   1 tsp olive oil.   3 oz grilled fish.  Document Released: 12/27/2004 Document Revised: 03/21/2011 Document Reviewed: 11/12/2010  Exit[employer] Patient Information 2013 [employer].

## 2012-02-20 ENCOUNTER — Ambulatory Visit: Payer: BC Managed Care – PPO | Admitting: Family

## 2012-03-02 ENCOUNTER — Telehealth: Payer: Self-pay | Admitting: Family

## 2012-03-02 MED ORDER — DICLOFENAC SODIUM 1 % TD GEL: 4.0000 g | TRANSDERMAL | Status: DC | PRN

## 2012-03-02 NOTE — Telephone Encounter (Signed)
Pt needs new script for VOLTAREN gel. Pt states Padonda campbell told her not to the VOLTAREN tablet due to stomach issues, but she no longer has the gel. Pharm:  [facility]/ [address]

## 2012-03-02 NOTE — Telephone Encounter (Signed)
Rx sent to pharmacy   

## 2012-07-25 LAB — OB RESULTS CONSOLE ANTIBODY SCREEN: Antibody Screen: NEGATIVE

## 2012-07-25 LAB — OB RESULTS CONSOLE HEPATITIS B SURFACE ANTIGEN: Hepatitis B Surface Ag: NEGATIVE

## 2012-07-25 LAB — OB RESULTS CONSOLE RPR: RPR: NONREACTIVE

## 2012-07-25 LAB — OB RESULTS CONSOLE GC/CHLAMYDIA
Chlamydia: NEGATIVE
Gonorrhea: NEGATIVE

## 2012-07-25 LAB — OB RESULTS CONSOLE ABO/RH: RH TYPE: POSITIVE

## 2012-07-25 LAB — OB RESULTS CONSOLE RUBELLA ANTIBODY, IGM: RUBELLA: IMMUNE

## 2012-10-02 IMAGING — CR DG ANKLE COMPLETE 3+V*R*
3 series · 3 of 3 positions shown · non-contrast
Comparison: None.

CLINICAL DATA: Chronic right foot pain

RIGHT ANKLE - COMPLETE 3+ VIEW

[view not recorded (1 of 3)]
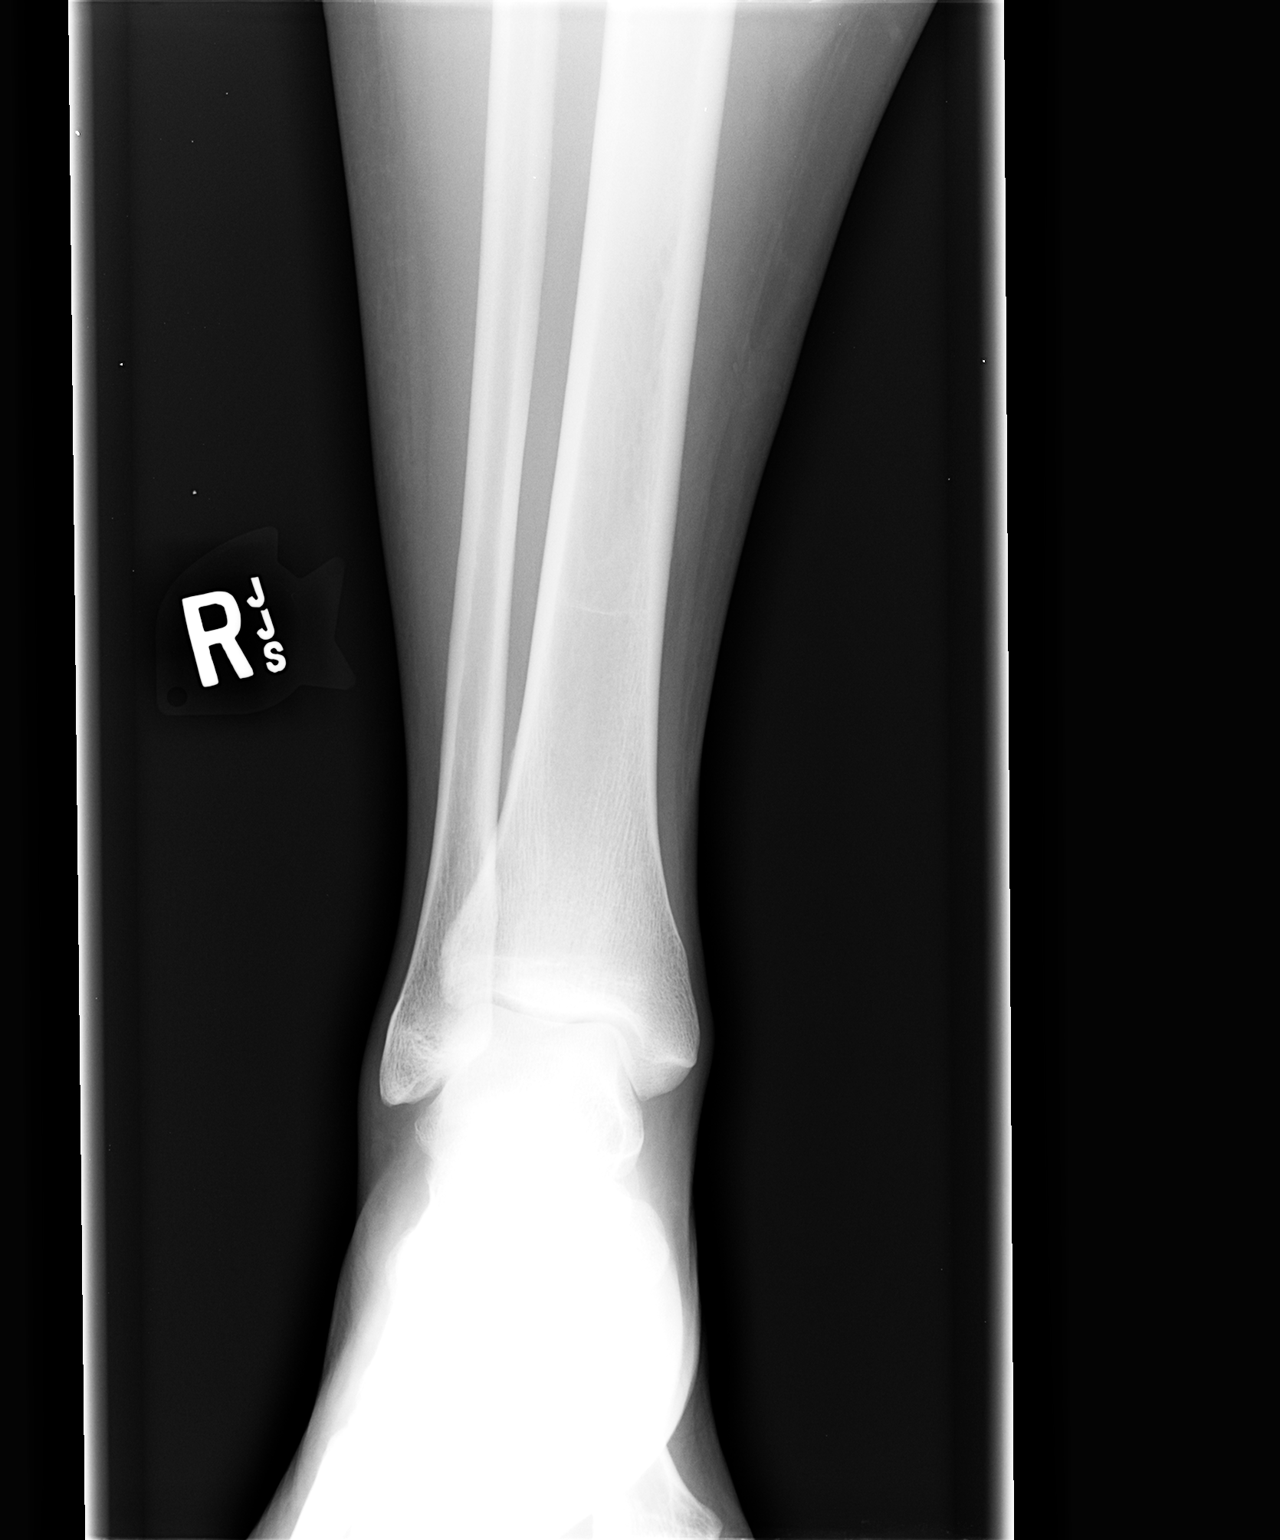

[view not recorded (2 of 3)]
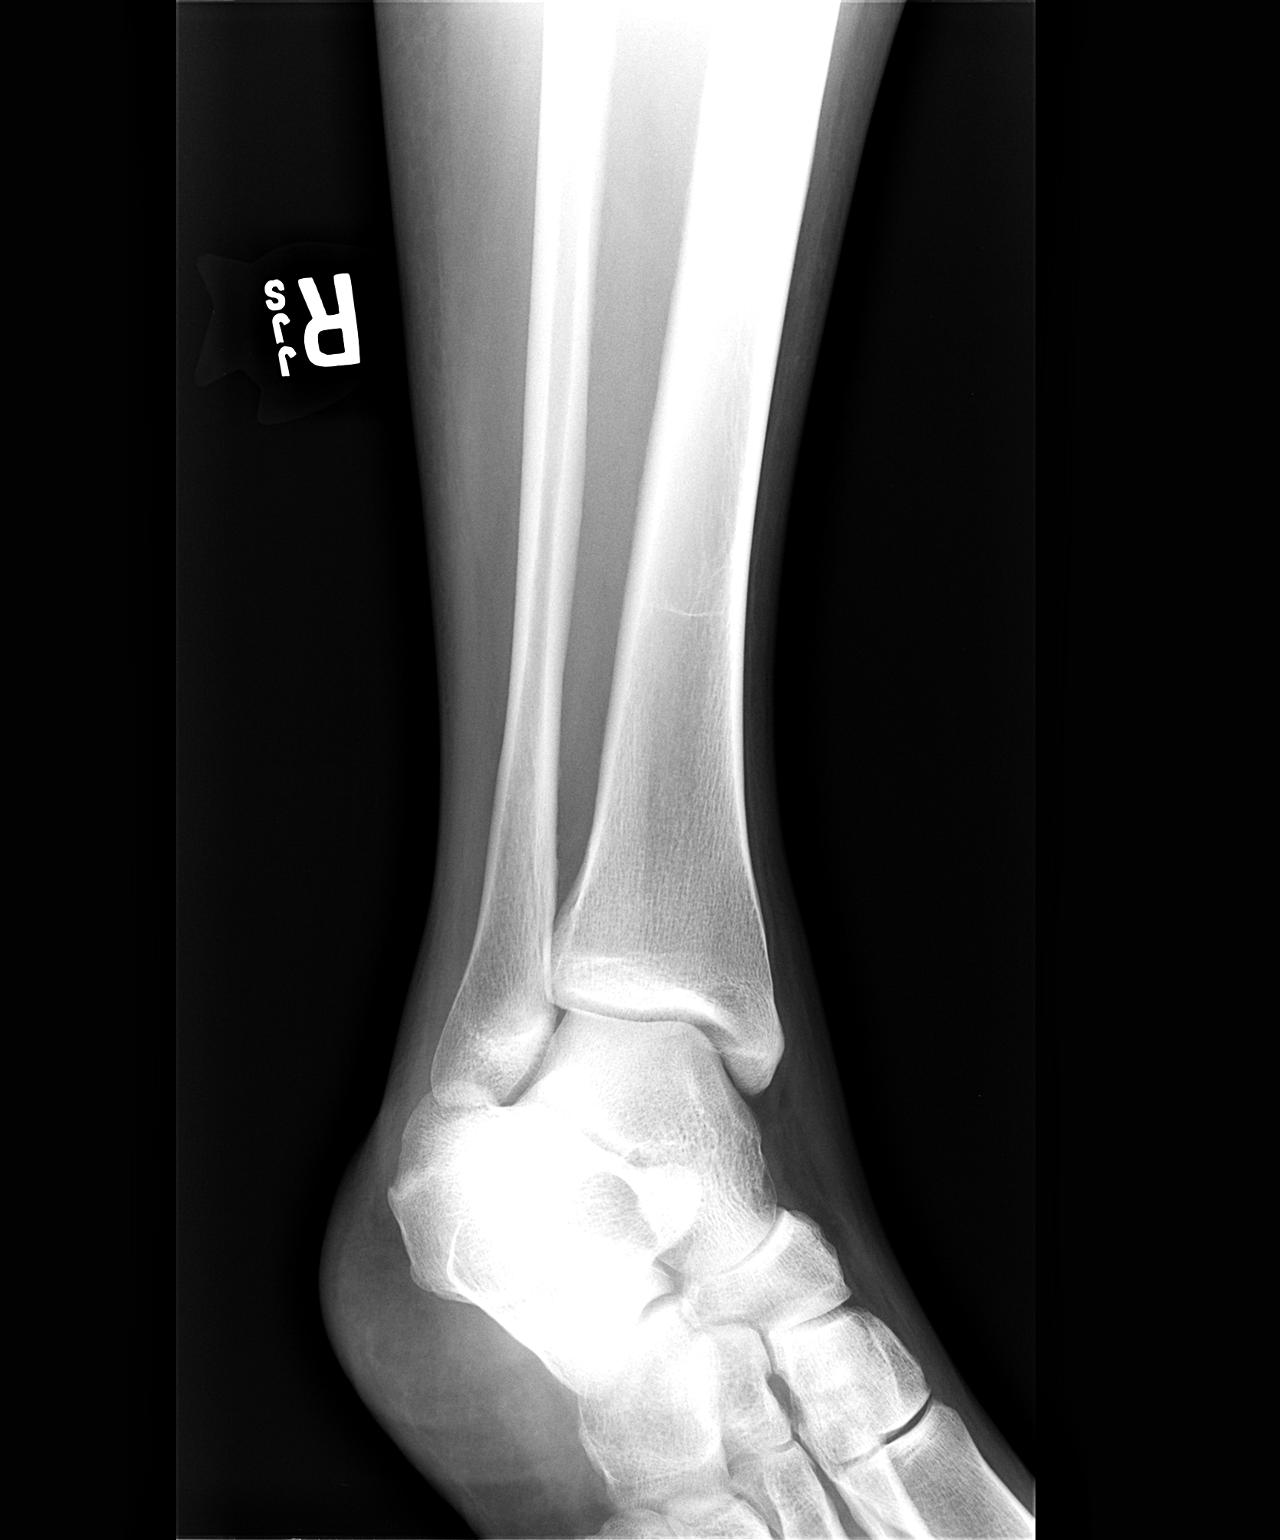

[view not recorded (3 of 3)]
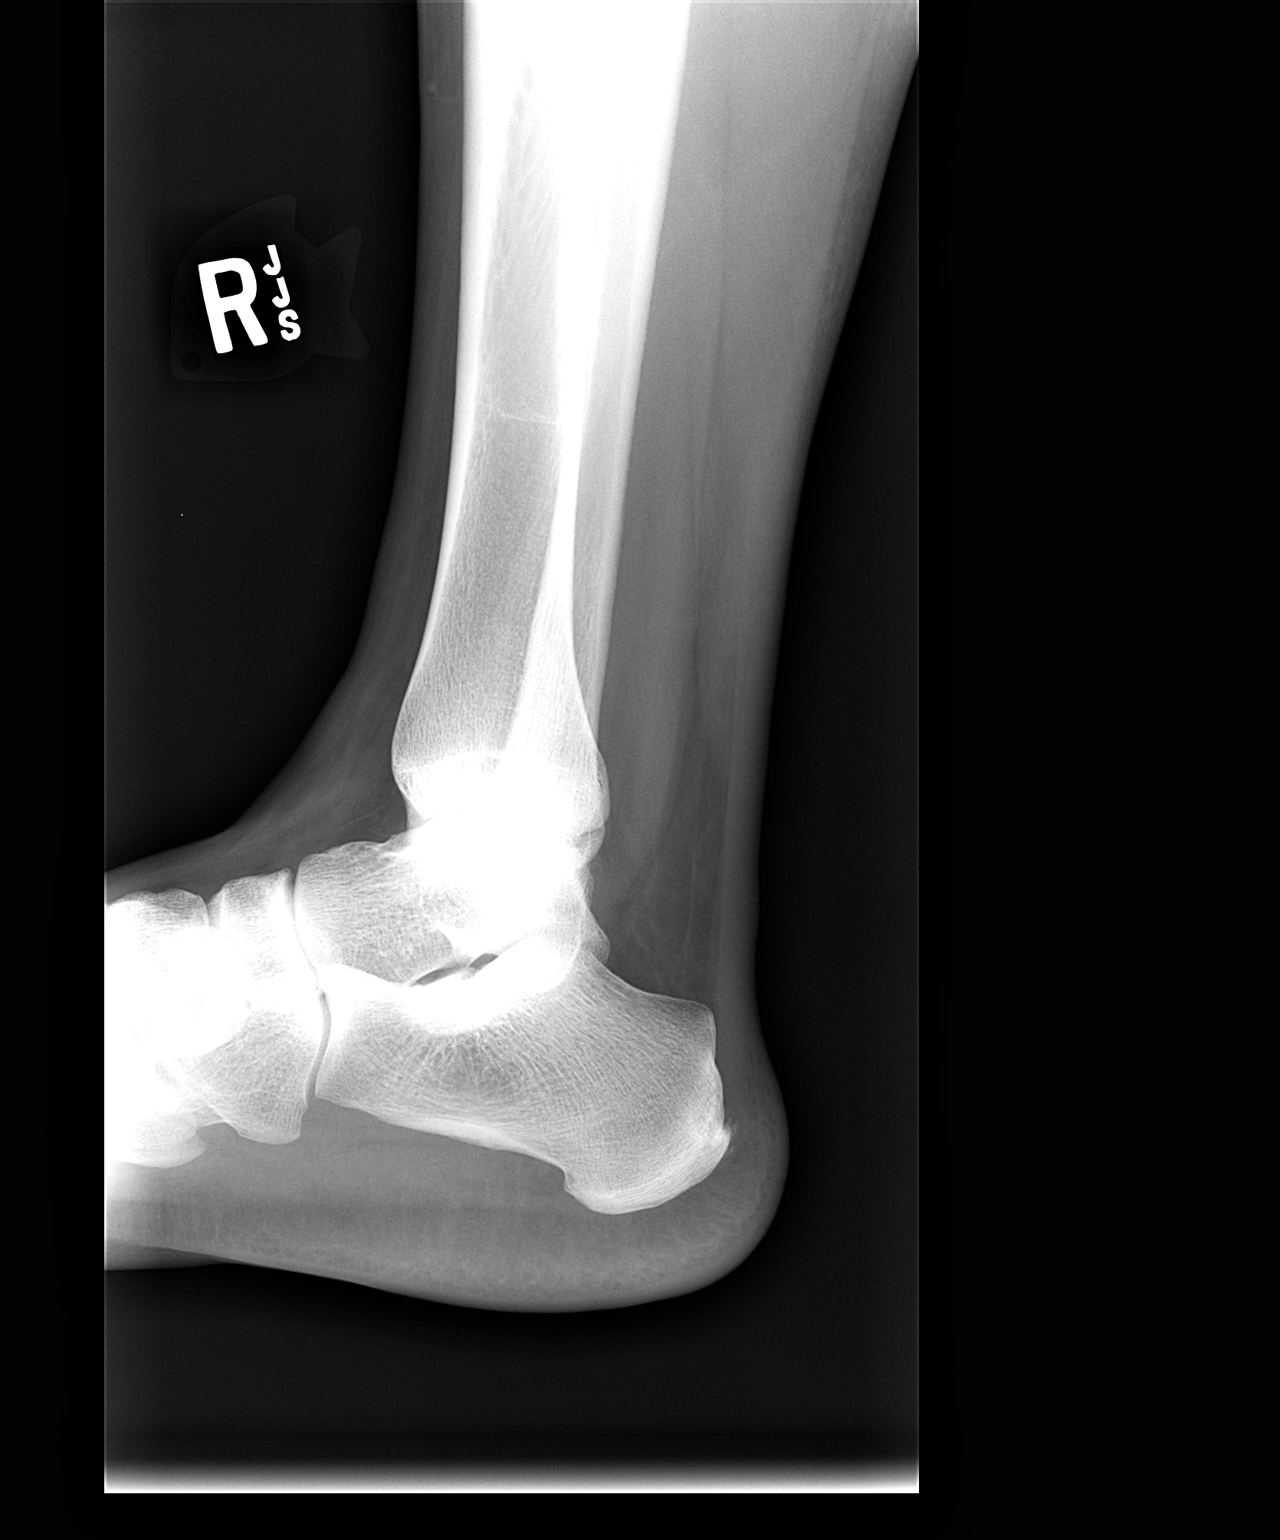

[3 of 3 positions shown; findings below may reference images not displayed]

FINDINGS: No fracture or dislocation is seen.

The ankle mortise is intact.

The base of the fifth metatarsal is unremarkable.

The visualized soft tissues are unremarkable.
IMPRESSION: Normal right ankle radiographs.

## 2012-11-16 LAB — OB RESULTS CONSOLE HIV ANTIBODY (ROUTINE TESTING): HIV: NONREACTIVE

## 2013-01-16 LAB — OB RESULTS CONSOLE GBS: GBS: POSITIVE

## 2013-02-05 ENCOUNTER — Encounter (HOSPITAL_COMMUNITY): Payer: Self-pay

## 2013-02-05 ENCOUNTER — Inpatient Hospital Stay (HOSPITAL_COMMUNITY): Payer: BC Managed Care – PPO | Admitting: Anesthesiology

## 2013-02-05 ENCOUNTER — Encounter (HOSPITAL_COMMUNITY): Payer: BC Managed Care – PPO | Admitting: Anesthesiology

## 2013-02-05 ENCOUNTER — Inpatient Hospital Stay (HOSPITAL_COMMUNITY)
Admission: AD | Admit: 2013-02-05 | Discharge: 2013-02-08 | DRG: 774 | Disposition: A | Discharge status: Home / Self Care | Payer: BC Managed Care – PPO | Source: Ambulatory Visit | Attending: Obstetrics and Gynecology | Admitting: Obstetrics and Gynecology

## 2013-02-05 DIAGNOSIS — IMO0001 Reserved for inherently not codable concepts without codable children: Secondary | ICD-10-CM

## 2013-02-05 DIAGNOSIS — O99344 Other mental disorders complicating childbirth: Secondary | ICD-10-CM | POA: Diagnosis present

## 2013-02-05 DIAGNOSIS — F329 Major depressive disorder, single episode, unspecified: Secondary | ICD-10-CM | POA: Diagnosis present

## 2013-02-05 DIAGNOSIS — K5900 Constipation, unspecified: Secondary | ICD-10-CM

## 2013-02-05 DIAGNOSIS — Z22330 Carrier of Group B streptococcus: Secondary | ICD-10-CM

## 2013-02-05 DIAGNOSIS — O9989 Other specified diseases and conditions complicating pregnancy, childbirth and the puerperium: Secondary | ICD-10-CM

## 2013-02-05 DIAGNOSIS — E669 Obesity, unspecified: Secondary | ICD-10-CM

## 2013-02-05 DIAGNOSIS — F3289 Other specified depressive episodes: Secondary | ICD-10-CM | POA: Diagnosis present

## 2013-02-05 DIAGNOSIS — O1002 Pre-existing essential hypertension complicating childbirth: Secondary | ICD-10-CM | POA: Diagnosis present

## 2013-02-05 DIAGNOSIS — O99892 Other specified diseases and conditions complicating childbirth: Secondary | ICD-10-CM | POA: Diagnosis present

## 2013-02-05 DIAGNOSIS — K219 Gastro-esophageal reflux disease without esophagitis: Secondary | ICD-10-CM

## 2013-02-05 LAB — CBC
HCT: 33.2 % — ABNORMAL LOW (ref 36.0–46.0)
Hemoglobin: 11.2 g/dL — ABNORMAL LOW (ref 12.0–15.0)
MCH: 30 pg (ref 26.0–34.0)
MCHC: 33.7 g/dL (ref 30.0–36.0)
MCV: 89 fL (ref 78.0–100.0)
PLATELETS: 283 10*3/uL (ref 150–400)
RBC: 3.73 MIL/uL — ABNORMAL LOW (ref 3.87–5.11)
RDW: 13.8 % (ref 11.5–15.5)
WBC: 7.2 10*3/uL (ref 4.0–10.5)

## 2013-02-05 MED ORDER — EPHEDRINE 5 MG/ML INJ
10.0000 mg | INTRAVENOUS | Status: DC | PRN
Start: 2013-02-05 — End: 2013-02-06
  Filled 2013-02-05: qty 2

## 2013-02-05 MED ORDER — LACTATED RINGERS IV SOLN: 500.0000 mL | INTRAVENOUS | Status: DC | PRN

## 2013-02-05 MED ORDER — LACTATED RINGERS IV SOLN
INTRAVENOUS | Status: DC
  Administered 2013-02-05: 23:00:00 via INTRAVENOUS

## 2013-02-05 MED ORDER — CITRIC ACID-SODIUM CITRATE 334-500 MG/5ML PO SOLN: 30.0000 mL | ORAL | Status: DC | PRN

## 2013-02-05 MED ORDER — ONDANSETRON HCL 4 MG/2ML IJ SOLN
4.0000 mg | Freq: Four times a day (QID) | INTRAMUSCULAR | Status: DC | PRN
Start: 2013-02-05 — End: 2013-02-06

## 2013-02-05 MED ORDER — FLEET ENEMA 7-19 GM/118ML RE ENEM: 1.0000 | ENEMA | RECTAL | Status: DC | PRN

## 2013-02-05 MED ORDER — EPHEDRINE 5 MG/ML INJ
10.0000 mg | INTRAVENOUS | Status: DC | PRN
  Filled 2013-02-05: qty 2
  Filled 2013-02-05: qty 4

## 2013-02-05 MED ORDER — FENTANYL 2.5 MCG/ML BUPIVACAINE 1/10 % EPIDURAL INFUSION (WH - ANES)
14.0000 mL/h | INTRAMUSCULAR | Status: DC | PRN
  Filled 2013-02-05: qty 125

## 2013-02-05 MED ORDER — ACETAMINOPHEN 325 MG PO TABS: 650.0000 mg | ORAL_TABLET | ORAL | Status: DC | PRN

## 2013-02-05 MED ORDER — IBUPROFEN 600 MG PO TABS: 600.0000 mg | ORAL_TABLET | Freq: Four times a day (QID) | ORAL | Status: DC | PRN

## 2013-02-05 MED ORDER — LIDOCAINE HCL (PF) 1 % IJ SOLN
INTRAMUSCULAR | Status: DC | PRN
  Administered 2013-02-05 (×2): 9 mL

## 2013-02-05 MED ORDER — FENTANYL 2.5 MCG/ML BUPIVACAINE 1/10 % EPIDURAL INFUSION (WH - ANES)
INTRAMUSCULAR | Status: DC | PRN
  Administered 2013-02-05: 14 mL/h via EPIDURAL

## 2013-02-05 MED ORDER — LACTATED RINGERS IV SOLN
500.0000 mL | Freq: Once | INTRAVENOUS | Status: AC
  Administered 2013-02-05: 23:00:00 via INTRAVENOUS

## 2013-02-05 MED ORDER — OXYTOCIN 40 UNITS IN LACTATED RINGERS INFUSION - SIMPLE MED
62.5000 mL/h | INTRAVENOUS | Status: DC
  Filled 2013-02-05: qty 1000

## 2013-02-05 MED ORDER — OXYCODONE-ACETAMINOPHEN 5-325 MG PO TABS: 1.0000 | ORAL_TABLET | ORAL | Status: DC | PRN

## 2013-02-05 MED ORDER — PHENYLEPHRINE 40 MCG/ML (10ML) SYRINGE FOR IV PUSH (FOR BLOOD PRESSURE SUPPORT)
80.0000 ug | PREFILLED_SYRINGE | INTRAVENOUS | Status: DC | PRN
  Filled 2013-02-05: qty 2

## 2013-02-05 MED ORDER — OXYTOCIN BOLUS FROM INFUSION
500.0000 mL | INTRAVENOUS | Status: DC
  Administered 2013-02-06: 500 mL via INTRAVENOUS

## 2013-02-05 MED ORDER — PHENYLEPHRINE 40 MCG/ML (10ML) SYRINGE FOR IV PUSH (FOR BLOOD PRESSURE SUPPORT)
80.0000 ug | PREFILLED_SYRINGE | INTRAVENOUS | Status: DC | PRN
  Filled 2013-02-05: qty 10
  Filled 2013-02-05: qty 2

## 2013-02-05 MED ORDER — LIDOCAINE HCL (PF) 1 % IJ SOLN
30.0000 mL | INTRAMUSCULAR | Status: DC | PRN
  Filled 2013-02-05 (×2): qty 30

## 2013-02-05 MED ORDER — SODIUM CHLORIDE 0.9 % IV SOLN
2.0000 g | Freq: Once | INTRAVENOUS | Status: AC
  Administered 2013-02-05: 2 g via INTRAVENOUS
  Filled 2013-02-05: qty 2000

## 2013-02-05 MED ORDER — DIPHENHYDRAMINE HCL 50 MG/ML IJ SOLN
12.5000 mg | INTRAMUSCULAR | Status: DC | PRN
  Administered 2013-02-06: 12.5 mg via INTRAVENOUS
  Filled 2013-02-05: qty 1

## 2013-02-05 NOTE — Anesthesia Preprocedure Evaluation (Signed)
Anesthesia Evaluation  Patient identified by MRN, date of birth, ID band Patient awake    Reviewed: Allergy & Precautions, H&P , NPO status , Patient's Chart, lab work & pertinent test results  Airway Mallampati: II TM Distance: >3 FB Neck ROM: full    Dental no notable dental hx.    Pulmonary neg pulmonary ROS,    Pulmonary exam normal       Cardiovascular hypertension,     Neuro/Psych negative neurological ROS     GI/Hepatic Neg liver ROS, GERD-  Medicated,  Endo/Other  negative endocrine ROS  Renal/GU negative Renal ROS     Musculoskeletal   Abdominal Normal abdominal exam  (+)   Peds  Hematology negative hematology ROS (+)   Anesthesia Other Findings   Reproductive/Obstetrics (+) Pregnancy                           Anesthesia Physical Anesthesia Plan  ASA: II  Anesthesia Plan: Epidural   Post-op Pain Management:    Induction:   Airway Management Planned:   Additional Equipment:   Intra-op Plan:   Post-operative Plan:   Informed Consent: I have reviewed the patients History and Physical, chart, labs and discussed the procedure including the risks, benefits and alternatives for the proposed anesthesia with the patient or authorized representative who has indicated his/her understanding and acceptance.     Plan Discussed with:   Anesthesia Plan Comments:         Anesthesia Quick Evaluation

## 2013-02-05 NOTE — H&P (Signed)
[name], [name] NO.:  [id number]  MEDICAL RECORD NO.:  [id number]  LOCATION:  1478                          FACILITY:  [facility]  PHYSICIAN:  Lucille Passy. Ulanda Edison, M.D. DATE OF BIRTH:  23-Oct-1975  DATE OF ADMISSION:  02/05/2013 DATE OF DISCHARGE:                             HISTORY & PHYSICAL   PRESENT ILLNESS:  This is a 38 year old black female, para 1-0-1-1, gravida 3, EDC February 15, 2013, ultrasound on July 09, 2012, at 8 weeks and 3 days, gave the due date of February 15, 2013.  Last Pap smear was November 13, results were normal.  The patient is admitted with labor. She states her contractions became regular about 9 p.m., came to the hospital, found to be 7 cm dilated.  Blood group and type AB positive with a negative antibody screen, rubella immune, RPR negative, urine culture negative,  hepatitis B surface antigen negative.  Hemoglobin electrophoresis AA, Chlamydia and gonorrhea negative.  First trimester screen normal.  Second trimester screen normal.  One-hour Glucola 94. Repeat HIV and RPR nonreactive.  Group B strep positive.  The patient began her prenatal course early in pregnancy, 10 weeks and 5 days.  At 14 weeks, the patient reported recurrence of depressive symptoms since being off Zoloft.  She was advised she might resume and at 25 mg a day, but she elected not to resume the Zoloft.  At 31 weeks, she measured 33- 1/2 cm, advised we will watch the fundal height.  She reported decreased fetal movement at 01/22/2013, and nonstress test was reactive.  At her last prenatal exam on January 31, 2013, the cervix was 0 cm dilated.  PAST MEDICAL HISTORY:  History of eczema at age 81.  She did have ulcer with gastritis.  She has had depressive disorder.  She has had a history of asthma, rheumatoid arthritis in her knees, back, and ankles.  SURGICAL HISTORY:  March 2010, a cervical disk fusion.  ALLERGIES:  CODEINE caused itching, MUCINEX hives and  itching, ROBITUSSIN hives, SUDAFED hives and itching, TYLENOL cold, head congestion with hives and itching.  She has no latex allergy.  She is allergic to SHELLFISH, OYSTERS.  FAMILY HISTORY:  Maternal grandmother with breast cancer, onset age 65 died at age 69.  Mother died from some type of malignancy.  SOCIAL HISTORY:  The patient has never been a smoker.  Does not drink. Denies illicit drugs.  She has 12 years of education.  Unemployed.  She is married.  PHYSICAL EXAMINATION:  VITAL SIGNS:  On admission; temperature 97.7, pulse 93, respirations 18, blood pressure 129/78. HEART:  Normal size and sounds.  No murmurs. LUNGS:  Clear to auscultation.  Fundal height at her last prenatal visit was 39.5 cm.  Per the admitting nurse, the cervix was 7 cm on admission.  ADMITTING IMPRESSION:  Intrauterine pregnancy 38 weeks and 4 days, active labor, positive group B strep.  The patient is admitted and will receive ampicillin to allow a speedier infusion.  She will be observed for progress in labor.     Lucille Passy. Ulanda Edison, M.D.     TFH/MEDQ  D:  02/05/2013  T:  02/05/2013  Job:  [id number] 

## 2013-02-05 NOTE — Anesthesia Procedure Notes (Signed)
Epidural Patient location during procedure: OB Start time: 02/05/2013 11:45 PM End time: 02/05/2013 11:49 PM  Staffing Anesthesiologist: Lyn Hollingshead Performed by: anesthesiologist   Preanesthetic Checklist Completed: patient identified, surgical consent, pre-op evaluation, timeout performed, IV checked, risks and benefits discussed and monitors and equipment checked  Epidural Patient position: sitting Prep: site prepped and draped and DuraPrep Patient monitoring: continuous pulse ox and blood pressure Approach: midline Injection technique: LOR air  Needle:  Needle type: Tuohy  Needle gauge: 17 G Needle length: 9 cm and 9 Needle insertion depth: 6 cm Catheter type: closed end flexible Catheter size: 19 Gauge Catheter at skin depth: 11 cm Test dose: negative and Other  Assessment Sensory level: T9 Events: blood not aspirated, injection not painful, no injection resistance, negative IV test and no paresthesia  Additional Notes Reason for block:procedure for pain

## 2013-02-05 NOTE — MAU Note (Signed)
Pt c/o uc that are every 3 minutes apart. Denies LOF. States some bloody show. + FM

## 2013-02-06 ENCOUNTER — Encounter (HOSPITAL_COMMUNITY): Payer: Self-pay | Admitting: General Practice

## 2013-02-06 LAB — TYPE AND SCREEN
ABO/RH(D): AB POS
Antibody Screen: NEGATIVE

## 2013-02-06 LAB — CBC
HCT: 30 % — ABNORMAL LOW (ref 36.0–46.0)
HEMOGLOBIN: 10.1 g/dL — AB (ref 12.0–15.0)
MCH: 30.1 pg (ref 26.0–34.0)
MCHC: 33.7 g/dL (ref 30.0–36.0)
MCV: 89.3 fL (ref 78.0–100.0)
Platelets: 245 10*3/uL (ref 150–400)
RBC: 3.36 MIL/uL — ABNORMAL LOW (ref 3.87–5.11)
RDW: 13.7 % (ref 11.5–15.5)
WBC: 13.1 10*3/uL — ABNORMAL HIGH (ref 4.0–10.5)

## 2013-02-06 LAB — RPR: RPR: NONREACTIVE

## 2013-02-06 LAB — ABO/RH: ABO/RH(D): AB POS

## 2013-02-06 MED ORDER — BENZOCAINE-MENTHOL 20-0.5 % EX AERO: 1.0000 "application " | INHALATION_SPRAY | CUTANEOUS | Status: DC | PRN

## 2013-02-06 MED ORDER — PANTOPRAZOLE SODIUM 40 MG PO TBEC
40.0000 mg | DELAYED_RELEASE_TABLET | Freq: Every day | ORAL | Status: AC
  Administered 2013-02-06 – 2013-02-08 (×3): 40 mg via ORAL
  Filled 2013-02-06 (×3): qty 1

## 2013-02-06 MED ORDER — ZOLPIDEM TARTRATE 5 MG PO TABS: 5.0000 mg | ORAL_TABLET | Freq: Every evening | ORAL | Status: DC | PRN

## 2013-02-06 MED ORDER — FLUTICASONE PROPIONATE 50 MCG/ACT NA SUSP
2.0000 | Freq: Two times a day (BID) | NASAL | Status: DC | PRN
  Filled 2013-02-06: qty 16

## 2013-02-06 MED ORDER — MEASLES, MUMPS & RUBELLA VAC ~~LOC~~ INJ
0.5000 mL | INJECTION | Freq: Once | SUBCUTANEOUS | Status: DC
  Filled 2013-02-06: qty 0.5

## 2013-02-06 MED ORDER — LACTATED RINGERS IV SOLN: INTRAVENOUS | Status: DC

## 2013-02-06 MED ORDER — DIPHENHYDRAMINE HCL 25 MG PO CAPS: 25.0000 mg | ORAL_CAPSULE | Freq: Four times a day (QID) | ORAL | Status: DC | PRN

## 2013-02-06 MED ORDER — LANOLIN HYDROUS EX OINT: TOPICAL_OINTMENT | CUTANEOUS | Status: DC | PRN

## 2013-02-06 MED ORDER — ONDANSETRON HCL 4 MG/2ML IJ SOLN: 4.0000 mg | INTRAMUSCULAR | Status: DC | PRN

## 2013-02-06 MED ORDER — IBUPROFEN 600 MG PO TABS
600.0000 mg | ORAL_TABLET | Freq: Four times a day (QID) | ORAL | Status: DC
  Administered 2013-02-06 – 2013-02-08 (×9): 600 mg via ORAL
  Filled 2013-02-06 (×9): qty 1

## 2013-02-06 MED ORDER — WITCH HAZEL-GLYCERIN EX PADS: 1.0000 "application " | MEDICATED_PAD | CUTANEOUS | Status: DC | PRN

## 2013-02-06 MED ORDER — SENNOSIDES-DOCUSATE SODIUM 8.6-50 MG PO TABS
2.0000 | ORAL_TABLET | ORAL | Status: DC
  Administered 2013-02-07 (×2): 2 via ORAL
  Filled 2013-02-06 (×2): qty 2

## 2013-02-06 MED ORDER — TRAMADOL HCL 50 MG PO TABS
50.0000 mg | ORAL_TABLET | Freq: Four times a day (QID) | ORAL | Status: DC | PRN
  Administered 2013-02-06 – 2013-02-08 (×4): 50 mg via ORAL
  Filled 2013-02-06 (×4): qty 1

## 2013-02-06 MED ORDER — TETANUS-DIPHTH-ACELL PERTUSSIS 5-2.5-18.5 LF-MCG/0.5 IM SUSP: 0.5000 mL | Freq: Once | INTRAMUSCULAR | Status: DC

## 2013-02-06 MED ORDER — ONDANSETRON HCL 4 MG PO TABS: 4.0000 mg | ORAL_TABLET | ORAL | Status: DC | PRN

## 2013-02-06 MED ORDER — PRENATAL MULTIVITAMIN CH
1.0000 | ORAL_TABLET | Freq: Every day | ORAL | Status: DC
  Administered 2013-02-06 – 2013-02-07 (×2): 1 via ORAL
  Filled 2013-02-06 (×2): qty 1

## 2013-02-06 MED ORDER — MONTELUKAST SODIUM 10 MG PO TABS
10.0000 mg | ORAL_TABLET | Freq: Every morning | ORAL | Status: DC
  Administered 2013-02-06 – 2013-02-08 (×3): 10 mg via ORAL
  Filled 2013-02-06 (×3): qty 1

## 2013-02-06 MED ORDER — DIBUCAINE 1 % RE OINT
1.0000 "application " | TOPICAL_OINTMENT | RECTAL | Status: DC | PRN
  Administered 2013-02-07: 1 via RECTAL
  Filled 2013-02-06: qty 28

## 2013-02-06 MED ORDER — OXYTOCIN 40 UNITS IN LACTATED RINGERS INFUSION - SIMPLE MED: 62.5000 mL/h | INTRAVENOUS | Status: AC | PRN

## 2013-02-06 MED ORDER — SIMETHICONE 80 MG PO CHEW: 80.0000 mg | CHEWABLE_TABLET | ORAL | Status: DC | PRN

## 2013-02-06 NOTE — Progress Notes (Signed)
Patient ID: [name], female   DOB: 1975/11/05, 38 y.o.   MRN: [id number] Contractions are q 3 minutes and the cervix is 9 cm 100 % effaced and the vertex is at - 1 station. AROM PRODUCED CLEAR FLUID.

## 2013-02-06 NOTE — Progress Notes (Signed)
Patient was referred for history of depression/anxiety. * Referral screened out by Clinical Social Worker because none of the following criteria appear to apply:  ~ History of anxiety/depression during this pregnancy, or of post-partum depression.  ~ Diagnosis of anxiety and/or depression within last 9 years, per chart review.  ~ History of depression due to pregnancy loss/loss of child  OR * Patient's symptoms currently being treated with medication and/or therapy.  Please contact the Clinical Social Worker if needs arise, or by the patient's request.

## 2013-02-06 NOTE — Progress Notes (Signed)
RN instructor at the beside with the student nurse; pt. Sleepy and desires to rest (drowsy eyes and pt. Stating how tired she is), infant currently asleep. RN instructed patient to call out if she needs anything.

## 2013-02-06 NOTE — Progress Notes (Signed)
Patient was breastfeeding, nursery nurse at the bedside.RN will return for a.m. assessment after patient finishes feeding. Patient instructed to call when she is finished feeding.

## 2013-02-06 NOTE — Progress Notes (Signed)
Patient ID: [name], female   DOB: 08-May-1975, 39 y.o.   MRN: [id number] Delivery note:  The pt became fully dilated with the vertex ROA and pushed well to deliver a living female infant ROA over an intact perineum. Apgars 9 and 9 at 1 and 5 minutes. There was mild shoulder dystocia managed by McRoberts and woodscrew maneuvers. The placenta was intact and the uterus was normal. There were small abrasions on the perineum that were hemostatic and not repaired. EBL 400 cc's.

## 2013-02-06 NOTE — Progress Notes (Signed)
Patient ID: [name], female   DOB: 02/08/1975, 38 y.o.   MRN: [id number] D DOD afebrile BP normal no complaints

## 2013-02-06 NOTE — Lactation Note (Signed)
This note was copied from the chart of [facility]. Lactation Consultation Note  Patient Name: [name] GGYIR'S Date: 02/06/2013 Reason for consult: Initial assessment of this second-time mother and her newborn, now 35 hours of age.  LC, MaryAnn and LC, Mechele Claude have both attempted to visit this mom.  Per Lollie Sails, she provided the [facility] brochure and her nurse has documented a review of [facility] services on mom's education record.  However, mom asleep at both [facility] visit attempts and will need further assessment and visit tomorrow.  Baby has nursed several times for 10-20 minutes thus far.   Maternal Data Formula Feeding for Exclusion: No Infant to breast within first hour of birth: Yes Has patient been taught Hand Expression?:  (not yet documented) Does the patient have breastfeeding experience prior to this delivery?:  (unknown at this time; will need further assessment)  Feeding Feeding Type: Breast Fed  LATCH Score/Interventions        most recent LATCH score=8, per RN assessment              Lactation Tools Discussed/Used     Consult Status Consult Status: Follow-up Date: 02/07/13 Follow-up type: In-patient    Junious Dresser [employer] 02/06/2013, 10:17 PM

## 2013-02-06 NOTE — Anesthesia Postprocedure Evaluation (Signed)
  Anesthesia Post-op Note  Patient: [name]  Procedure(s) Performed: * No procedures listed *  Patient Location: Mother/Baby  Anesthesia Type:Epidural  Level of Consciousness: awake  Airway and Oxygen Therapy: Patient Spontanous Breathing  Post-op Pain: mild  Post-op Assessment: Patient's Cardiovascular Status Stable and Respiratory Function Stable  Post-op Vital Signs: stable  Complications: No apparent anesthesia complications

## 2013-02-07 NOTE — Lactation Note (Signed)
This note was copied from the chart of [name]. Lactation Consultation Note Mom receptive to [facility] visit at this time; mom states baby has been very sleepy, gassy, spitty since birth. Mom not sure things are going ok or that baby is getting milk.  Mom states that she pumped and bottle fed her first child, and plans to do the same for this baby due to taking 5 classes and having the older child to care for. Discussed pump options with mom, she has BC/BS and will call them to find out what her coverage is, and let LC know if she needs a pump for rental or purchase.  Discussed the risk of exclusively pumping this early - that it might be difficult to get enough colostrum using only the pump. Discussed the importance of hand expressing after pumping to get more colostrum. Mom has small drops colostrum with hand expression.  Mom states she will still attempt to latch baby; offered to assist, mom accepts. Baby latched with audible swallows, mom reports pinching pain at nipple. Nipple has compression stripe. Attempted to reposition and relatch, mom still not very comfortable. Comfort gels provided with instructions for use.   Mom not sure at this time if she will go ahead and start pumping. Mom states very adamantly that she does not want to use any formula. Enc mom to call if she has any concerns, and to let [facility] or RN know if she needs a pump. Reviewed baby and me book br feeding basics, community resources, BFSG.    Patient Name: [name] KPTWS'F Date: 02/07/2013 Reason for consult: Initial assessment   Maternal Data Formula Feeding for Exclusion: No Has patient been taught Hand Expression?: Yes Does the patient have breastfeeding experience prior to this delivery?: Yes  Feeding Feeding Type: Breast Fed Length of feed: 20 min  LATCH Score/Interventions Latch: Grasps breast easily, tongue down, lips flanged, rhythmical sucking. Intervention(s): Assist with latch;Breast  massage;Breast compression;Adjust position  Audible Swallowing: A few with stimulation  Type of Nipple: Everted at rest and after stimulation  Comfort (Breast/Nipple): Filling, red/small blisters or bruises, mild/mod discomfort  Problem noted: Mild/Moderate discomfort Interventions (Mild/moderate discomfort): Comfort gels;Hand expression (positional stripe)  Hold (Positioning): Assistance needed to correctly position infant at breast and maintain latch. Intervention(s): Breastfeeding basics reviewed;Support Pillows;Position options  LATCH Score: 7  Lactation Tools Discussed/Used     Consult Status Consult Status: Follow-up Follow-up type: In-patient    [place] Shi [facility] 02/07/2013, 12:15 PM

## 2013-02-07 NOTE — Progress Notes (Signed)
Patient ID: [name], female   DOB: Jun 11, 1975, 38 y.o.   MRN: [id number] #1 afebrile BP normal Only complaint is swelling of feet. Advised to elevate feet, ambulate and watch salt intake.

## 2013-02-08 MED ORDER — IBUPROFEN 600 MG PO TABS: 600.0000 mg | ORAL_TABLET | Freq: Four times a day (QID) | ORAL | Status: DC | PRN

## 2013-02-08 MED ORDER — TRAMADOL HCL 50 MG PO TABS: 50.0000 mg | ORAL_TABLET | Freq: Four times a day (QID) | ORAL | Status: DC | PRN

## 2013-02-08 NOTE — Progress Notes (Signed)
Patient ID: [name], female   DOB: October 21, 1975, 38 y.o.   MRN: [id number] #[id number] afebrile BP normal no problems for d/c

## 2013-02-08 NOTE — Discharge Instructions (Signed)
booklet °

## 2013-02-09 NOTE — Discharge Summary (Signed)
NAMEINAS, AVENA NO.:  [id number]  MEDICAL RECORD NO.:  [id number]  LOCATION:  9109                          FACILITY:  [facility]  PHYSICIAN:  Lucille Passy. Ulanda Edison, M.D. DATE OF BIRTH:  July 05, 1975  DATE OF ADMISSION:  02/05/2013 DATE OF DISCHARGE:  02/08/2013                              DISCHARGE SUMMARY   HOSPITAL COURSE:  A 38 year old black female, para 1-0-1-1, gravida 3, EDC February 15, 2013, admitted at 7 cm dilated in labor.  Blood group and type AB positive, negative antibody.  All other prenatal tests were normal including first and second trimester screen and 1 hour Glucola, but the group B strep was positive.  After admission to the hospital, the patient was given ampicillin to allow a speedier infusion.  She received an epidural by 12:56 a.m. on 02/06/2013.  The contractions every 3 minutes.  Cervix was 9 cm, 100%.  Artificial rupture of the membranes produced clear fluid.  The patient became fully dilated with the vertex ROA and pushed well to deliver a living female infant ROA over an intact perineum.  Apgars 9 and nine at 1 and five minutes, mild shoulder dystocia managed by McRoberts and Wood screw maneuvers. Placenta intact.  Uterus normal.  There was small abrasions on the perineum that were hemostatic and not repaired.  Blood loss about 400 mL.  Postpartum, the patient did well and was discharged on the second postpartum day.  LABORATORY DATA:  Initial hemoglobin 11.2, hematocrit 33.2, white count 7200, platelet count 283,000.  RPR nonreactive.  Followup hemoglobin 10.1.  FINAL DIAGNOSES:  Intrauterine pregnancy 38+ weeks, delivered ROA.  OPERATION:  Spontaneous delivery ROA.  FINAL CONDITION:  Improved.  INSTRUCTIONS:  Include our regular discharge instruction booklet as well as the after visit summary.  Prescriptions for tramadol 50 mg 30 tablets 1 every 6 hours as needed for pain and Motrin 600 mg 30 tablets, 1 every 6 hours as needed for  pain given at discharge.  She is advised to return to the office in 6 weeks for followup examination.     Lucille Passy. Ulanda Edison, M.D.     TFH/MEDQ  D:  02/08/2013  T:  02/09/2013  Job:  [id number]

## 2013-10-04 ENCOUNTER — Encounter: Payer: Self-pay | Admitting: Nurse Practitioner

## 2013-10-04 ENCOUNTER — Encounter: Payer: Self-pay | Admitting: *Deleted

## 2013-10-14 ENCOUNTER — Encounter: Payer: Self-pay | Admitting: Nurse Practitioner

## 2013-10-14 ENCOUNTER — Ambulatory Visit (INDEPENDENT_AMBULATORY_CARE_PROVIDER_SITE_OTHER): Payer: BC Managed Care – PPO | Admitting: Nurse Practitioner

## 2013-10-14 ENCOUNTER — Other Ambulatory Visit (INDEPENDENT_AMBULATORY_CARE_PROVIDER_SITE_OTHER): Payer: BC Managed Care – PPO

## 2013-10-14 VITALS — BP 120/80 | HR 78 | Ht 66.0 in | Wt 213.0 lb

## 2013-10-14 DIAGNOSIS — R194 Change in bowel habit: Secondary | ICD-10-CM

## 2013-10-14 DIAGNOSIS — K648 Other hemorrhoids: Secondary | ICD-10-CM

## 2013-10-14 DIAGNOSIS — R1013 Epigastric pain: Secondary | ICD-10-CM

## 2013-10-14 DIAGNOSIS — R195 Other fecal abnormalities: Secondary | ICD-10-CM

## 2013-10-14 LAB — CBC WITH DIFFERENTIAL/PLATELET
Basophils Absolute: 0 10*3/uL (ref 0.0–0.1)
Basophils Relative: 0.4 % (ref 0.0–3.0)
EOS ABS: 0 10*3/uL (ref 0.0–0.7)
Eosinophils Relative: 1 % (ref 0.0–5.0)
HCT: 37.7 % (ref 36.0–46.0)
Hemoglobin: 12.6 g/dL (ref 12.0–15.0)
Lymphocytes Relative: 43.1 % (ref 12.0–46.0)
Lymphs Abs: 2.2 10*3/uL (ref 0.7–4.0)
MCHC: 33.5 g/dL (ref 30.0–36.0)
MCV: 94 fl (ref 78.0–100.0)
MONO ABS: 0.3 10*3/uL (ref 0.1–1.0)
Monocytes Relative: 6.3 % (ref 3.0–12.0)
NEUTROS ABS: 2.5 10*3/uL (ref 1.4–7.7)
NEUTROS PCT: 49.2 % (ref 43.0–77.0)
Platelets: 360 10*3/uL (ref 150.0–400.0)
RBC: 4.01 Mil/uL (ref 3.87–5.11)
RDW: 13.7 % (ref 11.5–15.5)
WBC: 5 10*3/uL (ref 4.0–10.5)

## 2013-10-14 MED ORDER — HYDROCORTISONE ACETATE 25 MG RE SUPP: 25.0000 mg | Freq: Every day | RECTAL | Status: DC

## 2013-10-14 NOTE — Progress Notes (Signed)
     History of Present Illness:   Patient is a 38 year old female, former patient of Dr. Sharlett Iles, followed for GERD and upper abdominal pain. She had an upper endoscopy September 2010 for dysphagia. Mild gastritis was found, esophagus was empirically dilated. Patient was last seen February 2014 and that was for evaluation of belching and epigastric pain. She was given GERD literature and Protonix was changed to Nexium.  Patient is back with recurrent upper abdominal pain. She has several gastrointestinal complaints today. Pain is postprandial, non-radiating.  She has associated bloating and describes early satiety. No NSAID use. There has been no weight loss, patient has actually gained a few pounds. She complains of right-sided back pain and is concerned about her gallbladder. She admits that back pain is hard to sort out since she has chronic back pain.   Lota also describes alternating bowel pattern. Normal bowel habit was that of 3-4 formed stools a day but over the last few months this has changed to alternating constipation /  diarrhea. Patient was doing well on Metamucil and doesn't really know why she stopped it.  She does describe intermittent black stools over the last month. Denies NSAID use. She stopped daily Nexium, now only taking TIW.    Current Medications, Allergies, Past Medical History, Past Surgical History, Family History and Social History were reviewed in [employer] record.  Physical Exam: General: Pleasant,obese, black female in no acute distress Head: Normocephalic and atraumatic Eyes:  sclerae anicteric, conjunctiva pink  Ears: Normal auditory acuity Lungs: Clear throughout to auscultation Heart: Regular rate and rhythm Abdomen: Soft, non distended, non-tender. No masses, no hepatomegaly. Normal bowel sounds Rectal: no external hemorrhoids, heme-negative Musculoskeletal: Symmetrical with no gross deformities  Extremities: No edema    Neurological: Alert oriented x 4, grossly nonfocal Psychological:  Alert and cooperative. Normal mood and affect  Assessment and Recommendations:   63. 38 year old female with multiple gastriointestial complaints. First, she has recurrent postprandial epigastric pain (seen by this in February 2013), bloating, early satiety. Patient stopped daily PPI, now only taking it about 3 times a week.  Suspect nonulcer dyspepsia.   We recommended ultrasound at her last office visit, it was not done for unclear reasons. Will obtain ultrasound to evaluate for gallbladder disease. Her LFTs were normal in the setting of the same symptoms so will not repeat those today  Restart daily PPI, 30 minutes prior to breakfast  Will see patient in followup in approximately one month. If not improving, consider EGD, her last one was ink 2010 with findings of gastritis  2. Intermittent black stools x 2 months in absence of bismuth or iron. Stool light brown, Hemoccult-negative today. Will check a CBC.   3. Altered bowel habits consisting of constipation/loose stool. Patient was doing well on Metamucil but she discontinued it. Recommend restarting Metamucil. If bowels don't return to normal then we may need to proceed with a colonoscopy for further evaluation.  4. Rectal bleeding, one episode on the heels of constipation. No external hemorrhoids, no obvious fissure. She does have internal hemorrhoids. Will treat with a course of steroid suppositories. Will reevaluate at next visit in one month or so.  Marland Kitchen

## 2013-10-14 NOTE — Patient Instructions (Addendum)
Recall office visit for November put in because Ana Savoy, NP schedule is not out.   You have been scheduled for an abdominal ultrasound at [facility] (1st floor of hospital) on 10-16-2013 at 9 am. Please arrive 15 minutes prior to your appointment for registration. Make certain not to have anything to eat or drink 6 hours prior to your appointment. Should you need to reschedule your appointment, please contact radiology at [phone]. This test typically takes about 30 minutes to perform.  We have sent the following medications to your pharmacy for you to pick up at your convenience: Anusol Suppositories for seven days  Please take Nexium once daily thirty minutes before breakfast Please take Metamucil over the counter as directed once daily.  Your physician has requested that you go to the basement for the following lab work before leaving today: CBC

## 2013-10-15 ENCOUNTER — Encounter: Payer: Self-pay | Admitting: Nurse Practitioner

## 2013-10-15 DIAGNOSIS — R195 Other fecal abnormalities: Secondary | ICD-10-CM | POA: Insufficient documentation

## 2013-10-15 DIAGNOSIS — K648 Other hemorrhoids: Secondary | ICD-10-CM | POA: Insufficient documentation

## 2013-10-15 DIAGNOSIS — R1013 Epigastric pain: Secondary | ICD-10-CM | POA: Insufficient documentation

## 2013-10-15 DIAGNOSIS — R194 Change in bowel habit: Secondary | ICD-10-CM | POA: Insufficient documentation

## 2013-10-16 ENCOUNTER — Ambulatory Visit (HOSPITAL_COMMUNITY)
Admission: RE | Admit: 2013-10-16 | Discharge: 2013-10-16 | Disposition: A | Discharge status: Home / Self Care | Payer: BC Managed Care – PPO | Source: Ambulatory Visit | Attending: Nurse Practitioner | Admitting: Nurse Practitioner

## 2013-10-16 DIAGNOSIS — R1013 Epigastric pain: Secondary | ICD-10-CM | POA: Insufficient documentation

## 2013-10-16 NOTE — Progress Notes (Signed)
I agree with the above note, plan 

## 2013-10-16 NOTE — Progress Notes (Signed)
Sent to me in error. 

## 2013-10-22 ENCOUNTER — Encounter: Payer: Self-pay | Admitting: Gastroenterology

## 2013-11-11 ENCOUNTER — Encounter: Payer: Self-pay | Admitting: Nurse Practitioner

## 2014-04-13 ENCOUNTER — Encounter: Payer: Self-pay | Admitting: Nurse Practitioner

## 2014-06-20 ENCOUNTER — Ambulatory Visit: Payer: Self-pay | Admitting: Podiatry

## 2015-03-12 ENCOUNTER — Encounter: Payer: Self-pay | Admitting: Family Medicine

## 2015-03-12 ENCOUNTER — Ambulatory Visit (INDEPENDENT_AMBULATORY_CARE_PROVIDER_SITE_OTHER): Payer: Self-pay | Admitting: Family Medicine

## 2015-03-12 VITALS — BP 120/80 | HR 76 | Temp 97.6°F | Wt 214.0 lb

## 2015-03-12 DIAGNOSIS — M7712 Lateral epicondylitis, left elbow: Secondary | ICD-10-CM | POA: Diagnosis not present

## 2015-03-12 MED ORDER — DICLOFENAC SODIUM 75 MG PO TBEC: 75.0000 mg | DELAYED_RELEASE_TABLET | Freq: Two times a day (BID) | ORAL | Status: DC

## 2015-03-12 NOTE — Patient Instructions (Signed)
Diclofenac has been prescribed for you today. Please take medication two times per day for symptom relief with food. If symptoms do not improve in 3-4 days, worsen, or you develop new symptoms, please contact clinic for further evaluation.  Lateral Epicondylitis With Rehab Lateral epicondylitis involves inflammation and pain around the outer portion of the elbow. The pain is caused by inflammation of the tendons in the forearm that bring back (extend) the wrist. Lateral epicondylitis is also called tennis elbow, because it is very common in tennis players. However, it may occur in any individual who extends the wrist repetitively. If lateral epicondylitis is left untreated, it may become a chronic problem. SYMPTOMS   Pain, tenderness, and inflammation on the outer (lateral) side of the elbow.  Pain or weakness with gripping activities.  Pain that increases with wrist-twisting motions (playing tennis, using a screwdriver, opening a door or a jar).  Pain with lifting objects, including a coffee cup. CAUSES  Lateral epicondylitis is caused by inflammation of the tendons that extend the wrist. Causes of injury may include:  Repetitive stress and strain on the muscles and tendons that extend the wrist.  Sudden change in activity level or intensity.  Incorrect grip in racquet sports.  Incorrect grip size of racquet (often too large).  Incorrect hitting position or technique (usually backhand, leading with the elbow).  Using a racket that is too heavy. RISK INCREASES WITH:  Sports or occupations that require repetitive and/or strenuous forearm and wrist movements (tennis, squash, racquetball, carpentry).  Poor wrist and forearm strength and flexibility.  Failure to warm up properly before activity.  Resuming activity before healing, rehabilitation, and conditioning are complete. PREVENTION   Warm up and stretch properly before activity.  Maintain physical fitness:  Strength,  flexibility, and endurance.  Cardiovascular fitness.  Wear and use properly fitted equipment.  Learn and use proper technique and have a coach correct improper technique.  Wear a tennis elbow (counterforce) brace. PROGNOSIS  The course of this condition depends on the degree of the injury. If treated properly, acute cases (symptoms lasting less than 4 weeks) are often resolved in 2 to 6 weeks. Chronic (longer lasting cases) often resolve in 3 to 6 months but may require physical therapy. RELATED COMPLICATIONS   Frequently recurring symptoms, resulting in a chronic problem. Properly treating the problem the first time decreases frequency of recurrence.  Chronic inflammation, scarring tendon degeneration, and partial tendon tear, requiring surgery.  Delayed healing or resolution of symptoms. TREATMENT  Treatment first involves the use of ice and medicine to reduce pain and inflammation. Strengthening and stretching exercises may help reduce discomfort if performed regularly. These exercises may be performed at home if the condition is an acute injury. Chronic cases may require a referral to a physical therapist for evaluation and treatment. Your caregiver may advise a corticosteroid injection to help reduce inflammation. Rarely, surgery is needed. MEDICATION  If pain medicine is needed, nonsteroidal anti-inflammatory medicines (aspirin and ibuprofen), or other minor pain relievers (acetaminophen), are often advised.  Do not take pain medicine for 7 days before surgery.  Prescription pain relievers may be given, if your caregiver thinks they are needed. Use only as directed and only as much as you need.  Corticosteroid injections may be recommended. These injections should be reserved only for the most severe cases, because they can only be given a certain number of times. HEAT AND COLD  Cold treatment (icing) should be applied for 10 to 15 minutes every 2  to 3 hours for inflammation and  pain, and immediately after activity that aggravates your symptoms. Use ice packs or an ice massage.  Heat treatment may be used before performing stretching and strengthening activities prescribed by your caregiver, physical therapist, or athletic trainer. Use a heat pack or a warm water soak. SEEK MEDICAL CARE IF: Symptoms get worse or do not improve in 2 weeks, despite treatment. EXERCISES  RANGE OF MOTION (ROM) AND STRETCHING EXERCISES - Epicondylitis, Lateral (Tennis Elbow) These exercises may help you when beginning to rehabilitate your injury. Your symptoms may go away with or without further involvement from your physician, physical therapist, or athletic trainer. While completing these exercises, remember:   Restoring tissue flexibility helps normal motion to return to the joints. This allows healthier, less painful movement and activity.  An effective stretch should be held for at least 30 seconds.  A stretch should never be painful. You should only feel a gentle lengthening or release in the stretched tissue. RANGE OF MOTION - Wrist Flexion, Active-Assisted  Extend your right / left elbow with your fingers pointing down.*  Gently pull the back of your hand towards you, until you feel a gentle stretch on the top of your forearm.  Hold this position for __________ seconds. Repeat __________ times. Complete this exercise __________ times per day.  *If directed by your physician, physical therapist or athletic trainer, complete this stretch with your elbow bent, rather than extended. RANGE OF MOTION - Wrist Extension, Active-Assisted  Extend your right / left elbow and turn your palm upwards.*  Gently pull your palm and fingertips back, so your wrist extends and your fingers point more toward the ground.  You should feel a gentle stretch on the inside of your forearm.  Hold this position for __________ seconds. Repeat __________ times. Complete this exercise __________ times  per day. *If directed by your physician, physical therapist or athletic trainer, complete this stretch with your elbow bent, rather than extended. STRETCH - Wrist Flexion  Place the back of your right / left hand on a tabletop, leaving your elbow slightly bent. Your fingers should point away from your body.  Gently press the back of your hand down onto the table by straightening your elbow. You should feel a stretch on the top of your forearm.  Hold this position for __________ seconds. Repeat __________ times. Complete this stretch __________ times per day.  STRETCH - Wrist Extension   Place your right / left fingertips on a tabletop, leaving your elbow slightly bent. Your fingers should point backwards.  Gently press your fingers and palm down onto the table by straightening your elbow. You should feel a stretch on the inside of your forearm.  Hold this position for __________ seconds. Repeat __________ times. Complete this stretch __________ times per day.  STRENGTHENING EXERCISES - Epicondylitis, Lateral (Tennis Elbow) These exercises may help you when beginning to rehabilitate your injury. They may resolve your symptoms with or without further involvement from your physician, physical therapist, or athletic trainer. While completing these exercises, remember:   Muscles can gain both the endurance and the strength needed for everyday activities through controlled exercises.  Complete these exercises as instructed by your physician, physical therapist or athletic trainer. Increase the resistance and repetitions only as guided.  You may experience muscle soreness or fatigue, but the pain or discomfort you are trying to eliminate should never worsen during these exercises. If this pain does get worse, stop and make sure you are  following the directions exactly. If the pain is still present after adjustments, discontinue the exercise until you can discuss the trouble with your  caregiver. STRENGTH - Wrist Flexors  Sit with your right / left forearm palm-up and fully supported on a table or countertop. Your elbow should be resting below the height of your shoulder. Allow your wrist to extend over the edge of the surface.  Loosely holding a __________ weight, or a piece of rubber exercise band or tubing, slowly curl your hand up toward your forearm.  Hold this position for __________ seconds. Slowly lower the wrist back to the starting position in a controlled manner. Repeat __________ times. Complete this exercise __________ times per day.  STRENGTH - Wrist Extensors  Sit with your right / left forearm palm-down and fully supported on a table or countertop. Your elbow should be resting below the height of your shoulder. Allow your wrist to extend over the edge of the surface.  Loosely holding a __________ weight, or a piece of rubber exercise band or tubing, slowly curl your hand up toward your forearm.  Hold this position for __________ seconds. Slowly lower the wrist back to the starting position in a controlled manner. Repeat __________ times. Complete this exercise __________ times per day.  STRENGTH - Ulnar Deviators  Stand with a ____________________ weight in your right / left hand, or sit while holding a rubber exercise band or tubing, with your healthy arm supported on a table or countertop.  Move your wrist, so that your pinkie travels toward your forearm and your thumb moves away from your forearm.  Hold this position for __________ seconds and then slowly lower the wrist back to the starting position. Repeat __________ times. Complete this exercise __________ times per day STRENGTH - Radial Deviators  Stand with a ____________________ weight in your right / left hand, or sit while holding a rubber exercise band or tubing, with your injured arm supported on a table or countertop.  Raise your hand upward in front of you or pull up on the rubber  tubing.  Hold this position for __________ seconds and then slowly lower the wrist back to the starting position. Repeat __________ times. Complete this exercise __________ times per day. STRENGTH - Forearm Supinators   Sit with your right / left forearm supported on a table, keeping your elbow below shoulder height. Rest your hand over the edge, palm down.  Gently grip a hammer or a soup ladle.  Without moving your elbow, slowly turn your palm and hand upward to a "thumbs-up" position.  Hold this position for __________ seconds. Slowly return to the starting position. Repeat __________ times. Complete this exercise __________ times per day.  STRENGTH - Forearm Pronators   Sit with your right / left forearm supported on a table, keeping your elbow below shoulder height. Rest your hand over the edge, palm up.  Gently grip a hammer or a soup ladle.  Without moving your elbow, slowly turn your palm and hand upward to a "thumbs-up" position.  Hold this position for __________ seconds. Slowly return to the starting position. Repeat __________ times. Complete this exercise __________ times per day.  STRENGTH - Grip  Grasp a tennis ball, a dense sponge, or a large, rolled sock in your hand.  Squeeze as hard as you can, without increasing any pain.  Hold this position for __________ seconds. Release your grip slowly. Repeat __________ times. Complete this exercise __________ times per day.  STRENGTH - Elbow Extensors,  Isometric  Stand or sit upright, on a firm surface. Place your right / left arm so that your palm faces your stomach, and it is at the height of your waist.  Place your opposite hand on the underside of your forearm. Gently push up as your right / left arm resists. Push as hard as you can with both arms, without causing any pain or movement at your right / left elbow. Hold this stationary position for __________ seconds. Gradually release the tension in both arms. Allow  your muscles to relax completely before repeating.   This information is not intended to replace advice given to you by your health care provider. Make sure you discuss any questions you have with your health care provider.   Document Released: 12/27/2004 Document Revised: 01/17/2014 Document Reviewed: 04/10/2008 Elsevier Interactive Patient Education [employer].

## 2015-03-12 NOTE — Progress Notes (Signed)
Pre visit review using our clinic review tool, if applicable. No additional management support is needed unless otherwise documented below in the visit note. 

## 2015-03-12 NOTE — Progress Notes (Signed)
Subjective:    Patient ID: [name], female    DOB: 1975/11/06, 40 y.o.   MRN: [id number]  HPI  [name] is a 40 year old female who presents today with Left elbow pain that is exacerbated by lifting cookware at home and boxes at work for 4 days. She states that the pain has improved, rating it as a 6 today with a maximum of 9 two days ago. Pain is described as "achy" and "nagging" that can be sharp at times. Associated symptoms of increased pain with turning wrist and lifting that occurs with work. She also noted some "tingling" on the lateral area of the left arm and in the 4th and 5th digits which has resolved. 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 Treatment at home includes wearing a wrist splint and ibuprofen once/day with minimal benefit.  Review of Systems  Constitutional: Negative for fever and chills.  Respiratory: Negative for chest tightness and shortness of breath.   Cardiovascular: Negative for chest pain and palpitations.  Musculoskeletal: Positive for arthralgias.       Left elbow pain that is exacerbated with twisting and turning wrist for picking up objects.  Skin: Negative for rash.  Neurological:       Tingling noted in Left 4th and 5th digit 2 days ago which has resolved.   Past Medical History  Diagnosis Date  . Arthritis   . Blood in stool   . Depression   . Allergy   . GERD (gastroesophageal reflux disease)   . Ulcer   . Hypertension   . Anal fissure   . Asthma     Social History   Social History  . Marital Status: Married    Spouse Name: N/A  . Number of Children: N/A  . Years of Education: N/A   Occupational History  . Not on file.   Social History Main Topics  . Smoking status: Never Smoker   . Smokeless tobacco: Former Systems developer  . Alcohol Use: Yes     Comment: occasional  . Drug Use: No  . Sexual Activity: Yes   Other Topics  Concern  . Not on file   Social History Narrative    Past Surgical History  Procedure Laterality Date  . Colonoscopy  2010  . Cervical discectomy  03-2008    Family History  Problem Relation Age of Onset  . Breast cancer Maternal Grandmother   . Ovarian cancer Mother   . Colon cancer Neg Hx   . Esophageal cancer Neg Hx   . Stomach cancer Neg Hx   . Diabetes Paternal Grandmother   . Kidney disease Neg Hx   . Liver disease Neg Hx     Allergies  Allergen Reactions  . Hydrocodone Itching  . Acetaminophen Hives and Itching  . Guaifenesin Hives and Itching  . Pseudoephedrine Hives, Itching and Other (See Comments)    Burning  . Shellfish Allergy Hives and Itching    Current Outpatient Prescriptions on File Prior to Visit  Medication Sig Dispense Refill  . cyclobenzaprine (FLEXERIL) 10 MG tablet Take 10 mg by mouth daily as needed for muscle spasms.    Marland Kitchen esomeprazole (NEXIUM) 40 MG capsule Take 1 capsule (40 mg total) by mouth daily. 30 capsule 3  . fluticasone (FLONASE) 50 MCG/ACT nasal spray Place 1 spray into both nostrils daily.    . hydrocortisone (ANUSOL-HC) 25 MG suppository Place 1  suppository (25 mg total) rectally at bedtime. 12 suppository 0  . ibuprofen (ADVIL,MOTRIN) 600 MG tablet Take 1 tablet (600 mg total) by mouth every 6 (six) hours as needed. 30 tablet 0  . montelukast (SINGULAIR) 10 MG tablet     . Multiple Vitamin (MULTIVITAMIN) tablet Take 1 tablet by mouth daily.    . traMADol (ULTRAM) 50 MG tablet Take 1 tablet (50 mg total) by mouth every 6 (six) hours as needed for moderate pain. 30 tablet 0  . triamcinolone ointment (KENALOG) 0.1 % Apply 1 application topically as needed.     No current facility-administered medications on file prior to visit.    BP 120/80 mmHg  Pulse 76  Temp(Src) 97.6 F (36.4 C) (Oral)  Wt 214 lb (97.07 kg)       Objective:   Physical Exam  Constitutional: She is oriented to person, place, and time. She appears  well-developed and well-nourished.  Cardiovascular: Normal rate and regular rhythm.   No murmur heard. Pulmonary/Chest: Effort normal and breath sounds normal.  Musculoskeletal: She exhibits tenderness.  Localized tenderness over lateral epicondyle noted upon palpation. Increased pain with wrist extension against resistance and pain with wrist flexion when elbow is fully extended. Negative Tinel and Negative Phalen   Neurological: She is alert and oriented to person, place, and time.  Skin: Skin is warm and dry. No rash noted.  Psychiatric: She has a normal mood and affect. Her behavior is normal.       Assessment & Plan:  1. Lateral epicondylitis (tennis elbow), left Suspect lateral epicondylitis with exam and history provided by patient. Advised patient to begin diclofenac today and ice can be applied up to 15 minutes every 2 to 3 hours immediately after activities that aggravate symptoms. If symptoms do not improve with treatment, worsen, or new symptoms develop, further evaluation and possible PT can be initiated. Patient voiced understanding and agreed with plan. - diclofenac (VOLTAREN) 75 MG EC tablet; Take 1 tablet (75 mg total) by mouth 2 (two) times daily.  Dispense: 30 tablet; Refill: 0

## 2015-08-19 DIAGNOSIS — Z3041 Encounter for surveillance of contraceptive pills: Secondary | ICD-10-CM | POA: Diagnosis not present

## 2015-08-19 DIAGNOSIS — N898 Other specified noninflammatory disorders of vagina: Secondary | ICD-10-CM | POA: Diagnosis not present

## 2015-12-11 DIAGNOSIS — R1013 Epigastric pain: Secondary | ICD-10-CM | POA: Diagnosis not present

## 2015-12-11 DIAGNOSIS — R1011 Right upper quadrant pain: Secondary | ICD-10-CM | POA: Diagnosis not present

## 2015-12-14 ENCOUNTER — Ambulatory Visit: Payer: BLUE CROSS/BLUE SHIELD | Admitting: Gastroenterology

## 2015-12-17 DIAGNOSIS — K8020 Calculus of gallbladder without cholecystitis without obstruction: Secondary | ICD-10-CM | POA: Diagnosis not present

## 2015-12-17 DIAGNOSIS — K7689 Other specified diseases of liver: Secondary | ICD-10-CM | POA: Diagnosis not present

## 2015-12-31 DIAGNOSIS — Z3201 Encounter for pregnancy test, result positive: Secondary | ICD-10-CM | POA: Diagnosis not present

## 2015-12-31 DIAGNOSIS — N912 Amenorrhea, unspecified: Secondary | ICD-10-CM | POA: Diagnosis not present

## 2016-01-01 DIAGNOSIS — K8020 Calculus of gallbladder without cholecystitis without obstruction: Secondary | ICD-10-CM | POA: Insufficient documentation

## 2016-01-11 NOTE — L&D Delivery Note (Signed)
Delivery Note Pt progressed rapidly to C/C/+2, pushed x 3 ctx for delivery.  At 11:01 PM a viable and healthy female was delivered via Vaginal, Spontaneous Delivery (Presentation: OA; ROT  ).  APGAR: 9,9 ; weight P .   Placenta status:delivered, intact .  Cord: 3V with the following complications: nuchal x 1.    Anesthesia:  epidural Episiotomy: None Lacerations: Perineal abrasion - no suture Suture Repair: N/A Est. Blood Loss (mL): 150cc  Mom to postpartum.  Baby to Couplet care / Skin to Skin.  Bryleigh Ottaway Bovard-Stuckert 07/15/2016, 11:35 PM  Br/AB+/RI/Tdap in PNC/Contra - vasectomy  Desires circumcision for female infant, d/w pt r/b/a - will proceed

## 2016-01-12 DIAGNOSIS — Z3A12 12 weeks gestation of pregnancy: Secondary | ICD-10-CM | POA: Diagnosis not present

## 2016-01-12 DIAGNOSIS — Z3682 Encounter for antenatal screening for nuchal translucency: Secondary | ICD-10-CM | POA: Diagnosis not present

## 2016-01-12 DIAGNOSIS — M5430 Sciatica, unspecified side: Secondary | ICD-10-CM | POA: Diagnosis not present

## 2016-01-12 DIAGNOSIS — J45909 Unspecified asthma, uncomplicated: Secondary | ICD-10-CM | POA: Diagnosis not present

## 2016-01-12 DIAGNOSIS — O3680X0 Pregnancy with inconclusive fetal viability, not applicable or unspecified: Secondary | ICD-10-CM | POA: Diagnosis not present

## 2016-01-12 DIAGNOSIS — O09529 Supervision of elderly multigravida, unspecified trimester: Secondary | ICD-10-CM | POA: Insufficient documentation

## 2016-01-12 DIAGNOSIS — Z3689 Encounter for other specified antenatal screening: Secondary | ICD-10-CM | POA: Diagnosis not present

## 2016-01-12 DIAGNOSIS — O09521 Supervision of elderly multigravida, first trimester: Secondary | ICD-10-CM | POA: Diagnosis not present

## 2016-01-12 LAB — OB RESULTS CONSOLE VARICELLA ZOSTER ANTIBODY, IGG: Varicella: IMMUNE

## 2016-01-13 LAB — OB RESULTS CONSOLE GC/CHLAMYDIA
Chlamydia: NEGATIVE
Gonorrhea: NEGATIVE

## 2016-01-15 DIAGNOSIS — O09529 Supervision of elderly multigravida, unspecified trimester: Secondary | ICD-10-CM | POA: Diagnosis not present

## 2016-01-15 DIAGNOSIS — Z3A12 12 weeks gestation of pregnancy: Secondary | ICD-10-CM | POA: Diagnosis not present

## 2016-01-15 DIAGNOSIS — Z3682 Encounter for antenatal screening for nuchal translucency: Secondary | ICD-10-CM | POA: Diagnosis not present

## 2016-02-26 DIAGNOSIS — Z3A18 18 weeks gestation of pregnancy: Secondary | ICD-10-CM | POA: Diagnosis not present

## 2016-02-26 DIAGNOSIS — Z363 Encounter for antenatal screening for malformations: Secondary | ICD-10-CM | POA: Diagnosis not present

## 2016-03-08 DIAGNOSIS — Z3A20 20 weeks gestation of pregnancy: Secondary | ICD-10-CM | POA: Diagnosis not present

## 2016-03-08 DIAGNOSIS — O09512 Supervision of elderly primigravida, second trimester: Secondary | ICD-10-CM | POA: Diagnosis not present

## 2016-03-08 DIAGNOSIS — Z23 Encounter for immunization: Secondary | ICD-10-CM | POA: Diagnosis not present

## 2016-04-27 DIAGNOSIS — Z23 Encounter for immunization: Secondary | ICD-10-CM | POA: Diagnosis not present

## 2016-04-27 DIAGNOSIS — Z3689 Encounter for other specified antenatal screening: Secondary | ICD-10-CM | POA: Diagnosis not present

## 2016-04-27 DIAGNOSIS — Z3A27 27 weeks gestation of pregnancy: Secondary | ICD-10-CM | POA: Diagnosis not present

## 2016-04-28 LAB — OB RESULTS CONSOLE HIV ANTIBODY (ROUTINE TESTING): HIV: NONREACTIVE

## 2016-05-02 DIAGNOSIS — J3089 Other allergic rhinitis: Secondary | ICD-10-CM | POA: Diagnosis not present

## 2016-05-02 DIAGNOSIS — J3081 Allergic rhinitis due to animal (cat) (dog) hair and dander: Secondary | ICD-10-CM | POA: Diagnosis not present

## 2016-05-02 DIAGNOSIS — L230 Allergic contact dermatitis due to metals: Secondary | ICD-10-CM | POA: Diagnosis not present

## 2016-05-02 DIAGNOSIS — J301 Allergic rhinitis due to pollen: Secondary | ICD-10-CM | POA: Diagnosis not present

## 2016-06-23 DIAGNOSIS — Z3685 Encounter for antenatal screening for Streptococcus B: Secondary | ICD-10-CM | POA: Diagnosis not present

## 2016-06-24 LAB — OB RESULTS CONSOLE GBS: STREP GROUP B AG: POSITIVE

## 2016-07-15 ENCOUNTER — Encounter (HOSPITAL_COMMUNITY): Payer: Self-pay

## 2016-07-15 ENCOUNTER — Inpatient Hospital Stay (HOSPITAL_COMMUNITY): Payer: BLUE CROSS/BLUE SHIELD | Admitting: Anesthesiology

## 2016-07-15 ENCOUNTER — Inpatient Hospital Stay (HOSPITAL_COMMUNITY)
Admission: AD | Admit: 2016-07-15 | Discharge: 2016-07-17 | DRG: 775 | Disposition: A | Discharge status: Home / Self Care | Payer: BLUE CROSS/BLUE SHIELD | Source: Ambulatory Visit | Attending: Obstetrics and Gynecology | Admitting: Obstetrics and Gynecology

## 2016-07-15 DIAGNOSIS — J45909 Unspecified asthma, uncomplicated: Secondary | ICD-10-CM | POA: Diagnosis present

## 2016-07-15 DIAGNOSIS — Z3490 Encounter for supervision of normal pregnancy, unspecified, unspecified trimester: Secondary | ICD-10-CM

## 2016-07-15 DIAGNOSIS — Z3A38 38 weeks gestation of pregnancy: Secondary | ICD-10-CM

## 2016-07-15 DIAGNOSIS — O9952 Diseases of the respiratory system complicating childbirth: Secondary | ICD-10-CM | POA: Diagnosis not present

## 2016-07-15 DIAGNOSIS — K219 Gastro-esophageal reflux disease without esophagitis: Secondary | ICD-10-CM | POA: Diagnosis present

## 2016-07-15 DIAGNOSIS — O99824 Streptococcus B carrier state complicating childbirth: Secondary | ICD-10-CM | POA: Diagnosis present

## 2016-07-15 DIAGNOSIS — O9962 Diseases of the digestive system complicating childbirth: Secondary | ICD-10-CM | POA: Diagnosis present

## 2016-07-15 DIAGNOSIS — O134 Gestational [pregnancy-induced] hypertension without significant proteinuria, complicating childbirth: Principal | ICD-10-CM | POA: Diagnosis present

## 2016-07-15 DIAGNOSIS — R030 Elevated blood-pressure reading, without diagnosis of hypertension: Secondary | ICD-10-CM | POA: Diagnosis not present

## 2016-07-15 LAB — COMPREHENSIVE METABOLIC PANEL
ALBUMIN: 2.7 g/dL — AB (ref 3.5–5.0)
ALK PHOS: 139 U/L — AB (ref 38–126)
ALT: 47 U/L (ref 14–54)
ANION GAP: 8 (ref 5–15)
AST: 33 U/L (ref 15–41)
BUN: 6 mg/dL (ref 6–20)
CHLORIDE: 105 mmol/L (ref 101–111)
CO2: 20 mmol/L — AB (ref 22–32)
CREATININE: 0.65 mg/dL (ref 0.44–1.00)
Calcium: 9.4 mg/dL (ref 8.9–10.3)
GFR calc non Af Amer: >60 mL/min (ref >60)
GLUCOSE: 78 mg/dL (ref 65–99)
Potassium: 4 mmol/L (ref 3.5–5.1)
SODIUM: 133 mmol/L — AB (ref 135–145)
Total Bilirubin: 0.8 mg/dL (ref 0.3–1.2)
Total Protein: 6.6 g/dL (ref 6.5–8.1)

## 2016-07-15 LAB — CBC
HEMATOCRIT: 31.6 % — AB (ref 36.0–46.0)
Hemoglobin: 10.4 g/dL — ABNORMAL LOW (ref 12.0–15.0)
MCH: 29.5 pg (ref 26.0–34.0)
MCHC: 32.9 g/dL (ref 30.0–36.0)
MCV: 89.8 fL (ref 78.0–100.0)
PLATELETS: 244 10*3/uL (ref 150–400)
RBC: 3.52 MIL/uL — ABNORMAL LOW (ref 3.87–5.11)
RDW: 14.3 % (ref 11.5–15.5)
WBC: 6.7 10*3/uL (ref 4.0–10.5)

## 2016-07-15 LAB — PROTEIN / CREATININE RATIO, URINE
Creatinine, Urine: 63 mg/dL
PROTEIN CREATININE RATIO: 0.13 mg/mg{creat} (ref 0.00–0.15)
Total Protein, Urine: 8 mg/dL

## 2016-07-15 LAB — TYPE AND SCREEN
ABO/RH(D): AB POS
Antibody Screen: NEGATIVE

## 2016-07-15 MED ORDER — DIPHENHYDRAMINE HCL 50 MG/ML IJ SOLN: 12.5000 mg | INTRAMUSCULAR | Status: DC | PRN

## 2016-07-15 MED ORDER — PENICILLIN G POTASSIUM 5000000 UNITS IJ SOLR
5.0000 10*6.[IU] | Freq: Once | INTRAVENOUS | Status: AC
  Administered 2016-07-15: 5 10*6.[IU] via INTRAVENOUS
  Filled 2016-07-15: qty 5

## 2016-07-15 MED ORDER — MONTELUKAST SODIUM 10 MG PO TABS
10.0000 mg | ORAL_TABLET | Freq: Every day | ORAL | Status: DC
  Administered 2016-07-15 – 2016-07-16 (×2): 10 mg via ORAL
  Filled 2016-07-15 (×2): qty 1

## 2016-07-15 MED ORDER — IBUPROFEN 600 MG PO TABS
600.0000 mg | ORAL_TABLET | Freq: Four times a day (QID) | ORAL | Status: DC
  Administered 2016-07-16 – 2016-07-17 (×7): 600 mg via ORAL
  Filled 2016-07-15 (×8): qty 1

## 2016-07-15 MED ORDER — PHENYLEPHRINE 40 MCG/ML (10ML) SYRINGE FOR IV PUSH (FOR BLOOD PRESSURE SUPPORT)
80.0000 ug | PREFILLED_SYRINGE | INTRAVENOUS | Status: DC | PRN
  Filled 2016-07-15: qty 10
  Filled 2016-07-15: qty 5

## 2016-07-15 MED ORDER — LACTATED RINGERS IV SOLN
INTRAVENOUS | Status: DC
  Administered 2016-07-15: 14:00:00 via INTRAVENOUS

## 2016-07-15 MED ORDER — LACTATED RINGERS IV SOLN
500.0000 mL | Freq: Once | INTRAVENOUS | Status: DC
Start: 2016-07-15 — End: 2016-07-17

## 2016-07-15 MED ORDER — OXYCODONE-ACETAMINOPHEN 5-325 MG PO TABS: 1.0000 | ORAL_TABLET | ORAL | Status: DC | PRN

## 2016-07-15 MED ORDER — FENTANYL 2.5 MCG/ML BUPIVACAINE 1/10 % EPIDURAL INFUSION (WH - ANES)
14.0000 mL/h | INTRAMUSCULAR | Status: DC | PRN
  Administered 2016-07-15: 14 mL/h via EPIDURAL
  Filled 2016-07-15: qty 100

## 2016-07-15 MED ORDER — TERBUTALINE SULFATE 1 MG/ML IJ SOLN
0.2500 mg | Freq: Once | INTRAMUSCULAR | Status: DC | PRN
  Filled 2016-07-15: qty 1

## 2016-07-15 MED ORDER — OXYTOCIN 40 UNITS IN LACTATED RINGERS INFUSION - SIMPLE MED
1.0000 m[IU]/min | INTRAVENOUS | Status: DC
  Administered 2016-07-15: 2 m[IU]/min via INTRAVENOUS
  Filled 2016-07-15: qty 1000

## 2016-07-15 MED ORDER — MOMETASONE FURO-FORMOTEROL FUM 100-5 MCG/ACT IN AERO
2.0000 | INHALATION_SPRAY | Freq: Two times a day (BID) | RESPIRATORY_TRACT | Status: DC
  Administered 2016-07-16 (×2): 2 via RESPIRATORY_TRACT
  Filled 2016-07-15: qty 8.8

## 2016-07-15 MED ORDER — FENTANYL CITRATE (PF) 100 MCG/2ML IJ SOLN
50.0000 ug | INTRAMUSCULAR | Status: DC | PRN
  Administered 2016-07-15: 100 ug via INTRAVENOUS
  Filled 2016-07-15: qty 2

## 2016-07-15 MED ORDER — PHENYLEPHRINE 40 MCG/ML (10ML) SYRINGE FOR IV PUSH (FOR BLOOD PRESSURE SUPPORT)
80.0000 ug | PREFILLED_SYRINGE | INTRAVENOUS | Status: DC | PRN
  Filled 2016-07-15: qty 5

## 2016-07-15 MED ORDER — ONDANSETRON HCL 4 MG/2ML IJ SOLN: 4.0000 mg | Freq: Four times a day (QID) | INTRAMUSCULAR | Status: DC | PRN

## 2016-07-15 MED ORDER — PANTOPRAZOLE SODIUM 40 MG PO TBEC
40.0000 mg | DELAYED_RELEASE_TABLET | Freq: Every day | ORAL | Status: DC
  Administered 2016-07-16 – 2016-07-17 (×2): 40 mg via ORAL
  Filled 2016-07-15 (×2): qty 1

## 2016-07-15 MED ORDER — LACTATED RINGERS IV SOLN: 500.0000 mL | INTRAVENOUS | Status: DC | PRN

## 2016-07-15 MED ORDER — FLUTICASONE PROPIONATE 50 MCG/ACT NA SUSP
1.0000 | Freq: Every day | NASAL | Status: DC
  Administered 2016-07-16 – 2016-07-17 (×2): 1 via NASAL
  Filled 2016-07-15: qty 16

## 2016-07-15 MED ORDER — FLEET ENEMA 7-19 GM/118ML RE ENEM: 1.0000 | ENEMA | Freq: Every day | RECTAL | Status: DC | PRN

## 2016-07-15 MED ORDER — LIDOCAINE HCL (PF) 1 % IJ SOLN
INTRAMUSCULAR | Status: DC | PRN
  Administered 2016-07-15: 13 mL via EPIDURAL

## 2016-07-15 MED ORDER — ACETAMINOPHEN 325 MG PO TABS: 650.0000 mg | ORAL_TABLET | ORAL | Status: DC | PRN

## 2016-07-15 MED ORDER — OXYTOCIN 40 UNITS IN LACTATED RINGERS INFUSION - SIMPLE MED: 2.5000 [IU]/h | INTRAVENOUS | Status: DC

## 2016-07-15 MED ORDER — EPHEDRINE 5 MG/ML INJ
10.0000 mg | INTRAVENOUS | Status: DC | PRN
  Filled 2016-07-15: qty 2

## 2016-07-15 MED ORDER — LIDOCAINE HCL (PF) 1 % IJ SOLN
30.0000 mL | INTRAMUSCULAR | Status: DC | PRN
  Filled 2016-07-15: qty 30

## 2016-07-15 MED ORDER — OXYCODONE-ACETAMINOPHEN 5-325 MG PO TABS: 2.0000 | ORAL_TABLET | ORAL | Status: DC | PRN

## 2016-07-15 MED ORDER — OXYTOCIN BOLUS FROM INFUSION
500.0000 mL | Freq: Once | INTRAVENOUS | Status: AC
  Administered 2016-07-15: 500 mL via INTRAVENOUS

## 2016-07-15 MED ORDER — SOD CITRATE-CITRIC ACID 500-334 MG/5ML PO SOLN: 30.0000 mL | ORAL | Status: DC | PRN

## 2016-07-15 MED ORDER — PENICILLIN G POT IN DEXTROSE 60000 UNIT/ML IV SOLN
3.0000 10*6.[IU] | INTRAVENOUS | Status: DC
  Administered 2016-07-15 (×2): 3 10*6.[IU] via INTRAVENOUS
  Filled 2016-07-15 (×5): qty 50

## 2016-07-15 NOTE — Anesthesia Preprocedure Evaluation (Signed)
Anesthesia Evaluation  Patient identified by MRN, date of birth, ID band Patient awake    Reviewed: Allergy & Precautions, H&P , NPO status , Patient's Chart, lab work & pertinent test results  Airway Mallampati: II  TM Distance: >3 FB Neck ROM: full    Dental no notable dental hx.    Pulmonary neg pulmonary ROS,    Pulmonary exam normal        Cardiovascular hypertension, Normal cardiovascular exam     Neuro/Psych negative neurological ROS     GI/Hepatic Neg liver ROS, GERD  Medicated,  Endo/Other  negative endocrine ROS  Renal/GU negative Renal ROS     Musculoskeletal   Abdominal Normal abdominal exam  (+)   Peds  Hematology negative hematology ROS (+)   Anesthesia Other Findings   Reproductive/Obstetrics (+) Pregnancy                             Anesthesia Physical  Anesthesia Plan  ASA: II  Anesthesia Plan: Epidural   Post-op Pain Management:    Induction:   PONV Risk Score and Plan:   Airway Management Planned:   Additional Equipment:   Intra-op Plan:   Post-operative Plan:   Informed Consent: I have reviewed the patients History and Physical, chart, labs and discussed the procedure including the risks, benefits and alternatives for the proposed anesthesia with the patient or authorized representative who has indicated his/her understanding and acceptance.     Plan Discussed with:   Anesthesia Plan Comments:         Anesthesia Quick Evaluation

## 2016-07-15 NOTE — Anesthesia Procedure Notes (Signed)
Epidural Patient location during procedure: OB Start time: 07/15/2016 10:22 PM End time: 07/15/2016 10:37 PM  Staffing Anesthesiologist: Candida Peeling RAY Performed: anesthesiologist   Preanesthetic Checklist Completed: patient identified, site marked, surgical consent, pre-op evaluation, timeout performed, IV checked, risks and benefits discussed and monitors and equipment checked  Epidural Patient position: sitting Prep: DuraPrep Patient monitoring: heart rate, cardiac monitor, continuous pulse ox and blood pressure Approach: midline Location: L2-L3 Injection technique: LOR saline  Needle:  Needle type: Tuohy  Needle gauge: 17 G Needle length: 9 cm Needle insertion depth: 6.5 cm Catheter type: closed end flexible Catheter size: 20 Guage Catheter at skin depth: 10 cm Test dose: negative  Assessment Events: blood not aspirated, injection not painful, no injection resistance, negative IV test and no paresthesia  Additional Notes Reason for block:procedure for pain

## 2016-07-15 NOTE — Anesthesia Pain Management Evaluation Note (Signed)
  CRNA Pain Management Visit Note  Patient: [name], 41 y.o., female  "Hello I am a member of the anesthesia team at [facility]. We have an anesthesia team available at all times to provide care throughout the hospital, including epidural management and anesthesia for C-section. I don't know your plan for the delivery whether it a natural birth, water birth, IV sedation, nitrous supplementation, doula or epidural, but we want to meet your pain goals."   1.Was your pain managed to your expectations on prior hospitalizations?   Yes   2.What is your expectation for pain management during this hospitalization?     Epidural  3.How can we help you reach that goal? Patient states not ready for epidural yet but wants that option.  Record the patient's initial score and the patient's pain goal.   Pain: 3  Pain Goal: 3 The [facility] wants you to be able to say your pain was always managed very well.  [facility] 07/15/2016

## 2016-07-15 NOTE — Progress Notes (Signed)
Patient ID: [name], female   DOB: September 20, 1975, 41 y.o.   MRN: [id number]  Starting to feel ctx  AFVSS (BP 140/80-90) gen NAD FHTs 120-130, mod var, + accels, category 1 toco Q 38min SVD 2.5/50/-2  AROM for small clear fluid  Continue IOL

## 2016-07-15 NOTE — H&P (Addendum)
BRECKIN ZAFAR is a 41 y.o. female [phone] at 24+ with elevated BP at term for IOL.  Relatively uncomplicated PNC.  Recently with elevated BP, no PIH sx's.  Preg complicated by AMA.  Also moderate asthma.  Some arthritis, gallbladder disease, eczema, and anxiety and depression.  Normal first trimester screen.       OB History    Gravida Para Term Preterm AB Living   4 2 2   1 2    SAB TAB Ectopic Multiple Live Births   1       2    SVD 7#8, female G2 SAB G3 8#, SVD female G4 present  No abn pap No STD  Past Medical History:  Diagnosis Date  . Allergy   . Anal fissure   . Arthritis   . Asthma   . Blood in stool   . Depression   . GERD (gastroesophageal reflux disease)   . Hypertension   . Ulcer    Past Surgical History:  Procedure Laterality Date  . CERVICAL DISCECTOMY  03-2008  . COLONOSCOPY  2010   Family History: family history includes Breast cancer in her maternal grandmother; Diabetes in her paternal grandmother; Hypertension in her father; Ovarian cancer in her mother. Social History:  reports that she has never smoked. She has quit using smokeless tobacco. She reports that she drinks alcohol. She reports that she does not use drugs.   Meds Fluticasone, montelukast, protonix, proctozone, zyrtec, probiotic, symbicoert, albuterol, PNV,   All: codeine, mucinex, robitussin, shellfish, sudafed, tylenol cold and sinus      Maternal Diabetes: No Genetic Screening: Normal Maternal Ultrasounds/Referrals: Normal Fetal Ultrasounds or other Referrals:  None Maternal Substance Abuse:  No Significant Maternal Medications:  None Significant Maternal Lab Results:  Lab values include: Group B Strep positive Other Comments:  None  Review of Systems  Constitutional: Negative.   HENT: Negative.   Eyes: Negative.   Respiratory: Negative.   Cardiovascular: Negative.   Gastrointestinal: Negative.   Genitourinary: Negative.   Musculoskeletal: Negative.   Skin: Negative.    Neurological: Negative.   Psychiatric/Behavioral: Negative.    Maternal Medical History:  Contractions: Frequency: irregular.    Fetal activity: Perceived fetal activity is normal.    Prenatal complications: PIH.   PIH  Prenatal Complications - Diabetes: none.    Dilation: 2 Effacement (%): 60 Station: -3 Exam by:: Mary Martinique Johnson, RN  Blood pressure 133/83, pulse 82, temperature 98.1 F (36.7 C), temperature source Oral, resp. rate 17, height 5' 5.25" (1.657 m), weight 104.8 kg (231 lb), unknown if currently breastfeeding. Maternal Exam:  Uterine Assessment: Contraction strength is moderate.  Contraction frequency is irregular.   Abdomen: Patient reports no abdominal tenderness. Fundal height is appropriate for gestation.   Estimated fetal weight is 8-9#.   Fetal presentation: vertex  Introitus: Normal vulva. Normal vagina.  Cervix: Cervix evaluated by digital exam.     Physical Exam  Constitutional: She is oriented to person, place, and time. She appears well-developed and well-nourished.  HENT:  Head: Normocephalic and atraumatic.  Cardiovascular: Normal rate and regular rhythm.   Respiratory: Effort normal and breath sounds normal. No respiratory distress. She has no wheezes.  GI: Soft. She exhibits no distension. There is no tenderness.  Musculoskeletal: Normal range of motion.  Neurological: She is alert and oriented to person, place, and time.  Skin: Skin is warm and dry.  Psychiatric: She has a normal mood and affect. Her behavior is normal.  Prenatal labs: ABO, Rh: --/--/AB POS (07/06 1421) Antibody: NEG (07/06 1421) Rubella:  immune RPR:   NR HBsAg:  neg  HIV:   neg GBS:   positive  Hgb 11.9/Plt 363K/Ur Cx neg/Chl neg/GC neg/Varicella immune/Hgb electro WNL/First trimester WNL/glucol 101  Nl anat, post plac, female Tdap in [facility]  Assessment/Plan: 40yo [id number] at 38+ with PIH, no sx's, but BP elevated PCN for GBBS+ AROM after PCN and pitocin  for IOL Expect SVD  Ishmael Berkovich Bovard-Stuckert 07/15/2016, 5:41 PM

## 2016-07-16 ENCOUNTER — Encounter (HOSPITAL_COMMUNITY): Payer: Self-pay

## 2016-07-16 LAB — CBC
HEMATOCRIT: 29.5 % — AB (ref 36.0–46.0)
HEMOGLOBIN: 10 g/dL — AB (ref 12.0–15.0)
MCH: 30 pg (ref 26.0–34.0)
MCHC: 33.9 g/dL (ref 30.0–36.0)
MCV: 88.6 fL (ref 78.0–100.0)
Platelets: 231 10*3/uL (ref 150–400)
RBC: 3.33 MIL/uL — ABNORMAL LOW (ref 3.87–5.11)
RDW: 14.2 % (ref 11.5–15.5)
WBC: 10.3 10*3/uL (ref 4.0–10.5)

## 2016-07-16 LAB — RPR: RPR: NONREACTIVE

## 2016-07-16 MED ORDER — ZOLPIDEM TARTRATE 5 MG PO TABS: 5.0000 mg | ORAL_TABLET | Freq: Every evening | ORAL | Status: DC | PRN

## 2016-07-16 MED ORDER — ONDANSETRON HCL 4 MG PO TABS: 4.0000 mg | ORAL_TABLET | ORAL | Status: DC | PRN

## 2016-07-16 MED ORDER — OXYCODONE HCL 5 MG PO TABS: 10.0000 mg | ORAL_TABLET | ORAL | Status: DC | PRN

## 2016-07-16 MED ORDER — SENNOSIDES-DOCUSATE SODIUM 8.6-50 MG PO TABS
2.0000 | ORAL_TABLET | ORAL | Status: DC
  Administered 2016-07-16: 2 via ORAL
  Filled 2016-07-16: qty 2

## 2016-07-16 MED ORDER — SIMETHICONE 80 MG PO CHEW
80.0000 mg | CHEWABLE_TABLET | ORAL | Status: DC | PRN
  Administered 2016-07-16: 80 mg via ORAL
  Filled 2016-07-16: qty 1

## 2016-07-16 MED ORDER — WITCH HAZEL-GLYCERIN EX PADS: 1.0000 "application " | MEDICATED_PAD | CUTANEOUS | Status: DC | PRN

## 2016-07-16 MED ORDER — BENZOCAINE-MENTHOL 20-0.5 % EX AERO: 1.0000 "application " | INHALATION_SPRAY | CUTANEOUS | Status: DC | PRN

## 2016-07-16 MED ORDER — COCONUT OIL OIL
1.0000 "application " | TOPICAL_OIL | Status: DC | PRN
  Administered 2016-07-16: 1 via TOPICAL
  Filled 2016-07-16: qty 120

## 2016-07-16 MED ORDER — ACETAMINOPHEN 325 MG PO TABS: 650.0000 mg | ORAL_TABLET | ORAL | Status: DC | PRN

## 2016-07-16 MED ORDER — DIBUCAINE 1 % RE OINT: 1.0000 "application " | TOPICAL_OINTMENT | RECTAL | Status: DC | PRN

## 2016-07-16 MED ORDER — ONDANSETRON HCL 4 MG/2ML IJ SOLN
4.0000 mg | INTRAMUSCULAR | Status: DC | PRN
Start: 2016-07-16 — End: 2016-07-17

## 2016-07-16 MED ORDER — PRENATAL MULTIVITAMIN CH
1.0000 | ORAL_TABLET | Freq: Every day | ORAL | Status: DC
  Administered 2016-07-16 – 2016-07-17 (×2): 1 via ORAL
  Filled 2016-07-16 (×2): qty 1

## 2016-07-16 MED ORDER — DIPHENHYDRAMINE HCL 25 MG PO CAPS
25.0000 mg | ORAL_CAPSULE | Freq: Four times a day (QID) | ORAL | Status: DC | PRN
Start: 2016-07-16 — End: 2016-07-17

## 2016-07-16 MED ORDER — OXYCODONE HCL 5 MG PO TABS: 5.0000 mg | ORAL_TABLET | ORAL | Status: DC | PRN

## 2016-07-16 NOTE — Lactation Note (Signed)
This note was copied from a baby's chart. Lactation Consultation Note expereinced BF mom 3rd baby. Breast/formula for 7 months. Mom has large short shaft nipples. Baby wanting to constantly BF at this time. Mom is tired wanting to rest. Discussed newborn feeding behaviors.  Mom states pinches some. Widened flange, baby had cheeks to breast. Demonstrated unlatching baby. Mom has long tin sharpe finger nails. Encouraged to cut and file nails.  Noted nipple having lipstick appearance. Stimulated nipple everted well. Encouraged to do so before latching. Hand expressed a dot of colostrum.  Mom had baby in football position wrapped in blanket. Removed blanket, encouraged STS.   Mom encouraged to feed baby 8-12 times/24 hours and with feeding cues. Educated I&O, cluster feeding supply and demand. Encouraged to BF before supplementing.  [place] brochure given w/resources, support groups and [facility] services.  Patient Name: [name] [id number] Date: 07/16/2016 Reason for consult: Initial assessment   Maternal Data Has patient been taught Hand Expression?: Yes Does the patient have breastfeeding experience prior to this delivery?: Yes  Feeding Feeding Type: Breast Fed Length of feed: 20 min  LATCH Score/Interventions Latch: Grasps breast easily, tongue down, lips flanged, rhythmical sucking. Intervention(s): Adjust position;Breast massage;Breast compression  Audible Swallowing: None Intervention(s): Skin to skin;Hand expression Intervention(s): Alternate breast massage  Type of Nipple: Everted at rest and after stimulation  Comfort (Breast/Nipple): Soft / non-tender     Hold (Positioning): Assistance needed to correctly position infant at breast and maintain latch. Intervention(s): Breastfeeding basics reviewed;Support Pillows;Position options;Skin to skin  LATCH Score: 7  Lactation Tools Discussed/Used WIC Program: No   Consult Status Consult Status: Follow-up Date: 07/16/16 (in  pm) Follow-up type: In-patient    Ervan Heber, Elta Guadeloupe 07/16/2016, 2:53 AM

## 2016-07-16 NOTE — Progress Notes (Signed)
Post Partum Day 1 Subjective: no complaints, up ad lib, voiding, tolerating PO and nl lochia, pain controlled  Objective: Blood pressure 139/84, pulse 85, temperature 98.4 F (36.9 C), temperature source Oral, resp. rate 18, height 5' 5.25" (1.657 m), weight 104.8 kg (231 lb), SpO2 100 %, unknown if currently breastfeeding.  Physical Exam:  General: alert and no distress Lochia: appropriate Uterine Fundus: firm   Recent Labs  07/15/16 1421 07/16/16 0458  HGB 10.4* 10.0*  HCT 31.6* 29.5*    Assessment/Plan: Plan for discharge tomorrow, Breastfeeding and Lactation consult.  Routine care.     LOS: 1 day   Krzysztof Reichelt Bovard-Stuckert 07/16/2016, 6:58 AM

## 2016-07-16 NOTE — Progress Notes (Signed)
MOB was referred for history of depression/anxiety. * Referral screened out by Clinical Social Worker because none of the following criteria appear to apply: ~ History of anxiety/depression during this pregnancy, or of post-partum depression. ~ Diagnosis of anxiety and/or depression within last 3 years; MOB diagnosed over 12 years ago and not concerns noted in OB records. OR * MOB's symptoms currently being treated with medication and/or therapy.  Please contact the Clinical Social Worker if needs arise, or if MOB requests.  Laurey Arrow, MSW, LCSW Clinical Social Work [phone]

## 2016-07-16 NOTE — Anesthesia Postprocedure Evaluation (Signed)
Anesthesia Post Note  Patient: [name]  Procedure(s) Performed: * No procedures listed *     Anesthesia Type: Epidural    Last Vitals:  Vitals:   07/16/16 0212 07/16/16 0601  BP: (!) 154/74 139/84  Pulse: 91 85  Resp: 18 18  Temp: 36.8 C 36.9 C    Last Pain:  Vitals:   07/16/16 0601  TempSrc: Oral  PainSc:    Pain Goal:                 Casimer Lanius

## 2016-07-16 NOTE — Procedures (Signed)
Circumcision Note  Procedure reviewed with mother including r/b/a - will proceed ID verified Ring block with 1% lidocaine Circumcision with 1.1 gomco, w/o diff/comp Hemostatic with gelfoam

## 2016-07-17 MED ORDER — IBUPROFEN 800 MG PO TABS: 800.0000 mg | ORAL_TABLET | Freq: Three times a day (TID) | ORAL | 1 refills | Status: DC | PRN

## 2016-07-17 MED ORDER — PRENATAL MULTIVITAMIN CH: 1.0000 | ORAL_TABLET | Freq: Every day | ORAL | 3 refills | Status: DC

## 2016-07-17 NOTE — Discharge Summary (Signed)
OB Discharge Summary     Patient Name: [name] DOB: 1975-04-27 MRN: [id number]  Date of admission: 07/15/2016 Delivering MD: Janyth Contes   Date of discharge: 07/17/2016  Admitting diagnosis: INDUCTION Intrauterine pregnancy: [redacted]w[redacted]d     Secondary diagnosis:  Principal Problem:   SVD (spontaneous vaginal delivery) Active Problems:   Pregnancy  Additional problems: PIH     Discharge diagnosis: Term Pregnancy Delivered                                                                                                Post partum procedures:N/A  Augmentation: AROM and Pitocin  Complications: None  Hospital course:  Induction of Labor With Vaginal Delivery   41 y.o. yo [phone] at [redacted]w[redacted]d was admitted to the hospital 07/15/2016 for induction of labor.  Indication for induction: Gestational hypertension.  Patient had an uncomplicated labor course as follows: Membrane Rupture Time/Date: 6:08 PM ,07/15/2016   Intrapartum Procedures: Episiotomy: None [1]                                         Lacerations:  Perineal [11]  Patient had delivery of a Viable infant.  Information for the patient's newborn:  Jlynn, Ly [[id number]]  Delivery Method: Vag-Spont   07/15/2016  Details of delivery can be found in separate delivery note.  Patient had a routine postpartum course. Patient is discharged home 07/17/16.  Physical exam  Vitals:   07/16/16 0601 07/16/16 1530 07/16/16 1956 07/16/16 2300  BP: 139/84 116/72 130/79 122/79  Pulse: 85 78 83 84  Resp: 18 18 18 18   Temp: 98.4 F (36.9 C) 97.7 F (36.5 C) 98.4 F (36.9 C) 98.7 F (37.1 C)  TempSrc: Oral Oral Oral Oral  SpO2: 100% 100%  96%  Weight:      Height:       General: alert and no distress Lochia: appropriate Uterine Fundus: firm  Lab Results  Component Value Date   WBC 10.3 07/16/2016   HGB 10.0 (L) 07/16/2016   HCT 29.5 (L) 07/16/2016   MCV 88.6 07/16/2016   PLT 231 07/16/2016   CMP Latest Ref Rng &  Units 07/15/2016  Glucose 65 - 99 mg/dL 78  BUN 6 - 20 mg/dL 6  Creatinine 0.44 - 1.00 mg/dL 0.65  Sodium 135 - 145 mmol/L 133(L)  Potassium 3.5 - 5.1 mmol/L 4.0  Chloride 101 - 111 mmol/L 105  CO2 22 - 32 mmol/L 20(L)  Calcium 8.9 - 10.3 mg/dL 9.4  Total Protein 6.5 - 8.1 g/dL 6.6  Total Bilirubin 0.3 - 1.2 mg/dL 0.8  Alkaline Phos 38 - 126 U/L 139(H)  AST 15 - 41 U/L 33  ALT 14 - 54 U/L 47    Discharge instruction: per After Visit Summary and "Baby and Me Booklet".  After visit meds:  Allergies as of 07/17/2016      Reactions   Hydrocodone Itching   Acetaminophen Hives, Itching   Guaifenesin Hives, Itching   Pseudoephedrine Hives,  Itching, Other (See Comments)   Burning   Shellfish Allergy Hives, Itching      Medication List    TAKE these medications   budesonide-formoterol 80-4.5 MCG/ACT inhaler Commonly known as:  SYMBICORT Inhale 2 puffs into the lungs 2 (two) times daily.   diclofenac 75 MG EC tablet Commonly known as:  VOLTAREN Take 1 tablet (75 mg total) by mouth 2 (two) times daily.   esomeprazole 40 MG capsule Commonly known as:  NEXIUM Take 1 capsule (40 mg total) by mouth daily.   fluticasone 50 MCG/ACT nasal spray Commonly known as:  FLONASE Place 1 spray into both nostrils daily.   ibuprofen 800 MG tablet Commonly known as:  ADVIL,MOTRIN Take 1 tablet (800 mg total) by mouth every 8 (eight) hours as needed.   montelukast 10 MG tablet Commonly known as:  SINGULAIR   pantoprazole 40 MG tablet Commonly known as:  PROTONIX Take 40 mg by mouth daily.   prenatal multivitamin Tabs tablet Take 1 tablet by mouth daily at 12 noon.   triamcinolone ointment 0.1 % Commonly known as:  KENALOG Apply 1 application topically as needed.       Diet: routine diet  Activity: Advance as tolerated. Pelvic rest for 6 weeks.   Outpatient follow up:6 weeks Follow up Appt:No future appointments. Follow up Visit:No Follow-up on file.  Postpartum  contraception: Vasectomy   Newborn Data: Live born female  Birth Weight: 7 lb 1.9 oz (3229 g) APGAR: 9, 9  Baby Feeding: Breast Disposition:home with mother   07/17/2016 Janyth Contes, MD

## 2016-07-17 NOTE — Progress Notes (Signed)
Post Partum Day 2 Subjective: no complaints, up ad lib, voiding, tolerating PO and nl lochia, pain controlled  Objective: Blood pressure 122/79, pulse 84, temperature 98.7 F (37.1 C), temperature source Oral, resp. rate 18, height 5' 5.25" (1.657 m), weight 104.8 kg (231 lb), SpO2 96 %, unknown if currently breastfeeding.  Physical Exam:  General: alert and no distress Lochia: appropriate Uterine Fundus: firm   Recent Labs  07/15/16 1421 07/16/16 0458  HGB 10.4* 10.0*  HCT 31.6* 29.5*    Assessment/Plan: Discharge home, Breastfeeding and Lactation consult.  D/c with motrin and PNV.  F/u 6 weeks.     LOS: 2 days   [name] 07/17/2016, 8:15 AM

## 2016-10-03 ENCOUNTER — Encounter: Payer: Self-pay | Admitting: Family Medicine

## 2016-10-03 ENCOUNTER — Ambulatory Visit (INDEPENDENT_AMBULATORY_CARE_PROVIDER_SITE_OTHER): Payer: BLUE CROSS/BLUE SHIELD | Admitting: Family Medicine

## 2016-10-03 VITALS — BP 130/90 | Temp 98.3°F | Ht 65.0 in | Wt 208.7 lb

## 2016-10-03 DIAGNOSIS — R009 Unspecified abnormalities of heart beat: Secondary | ICD-10-CM

## 2016-10-03 DIAGNOSIS — Z889 Allergy status to unspecified drugs, medicaments and biological substances status: Secondary | ICD-10-CM

## 2016-10-03 DIAGNOSIS — O165 Unspecified maternal hypertension, complicating the puerperium: Secondary | ICD-10-CM | POA: Diagnosis not present

## 2016-10-03 DIAGNOSIS — Z7689 Persons encountering health services in other specified circumstances: Secondary | ICD-10-CM

## 2016-10-03 DIAGNOSIS — M1990 Unspecified osteoarthritis, unspecified site: Secondary | ICD-10-CM | POA: Diagnosis not present

## 2016-10-03 DIAGNOSIS — Z9189 Other specified personal risk factors, not elsewhere classified: Secondary | ICD-10-CM | POA: Diagnosis not present

## 2016-10-03 MED ORDER — NIFEDIPINE ER OSMOTIC RELEASE 30 MG PO TB24: 30.0000 mg | ORAL_TABLET | Freq: Every day | ORAL | 4 refills | Status: DC

## 2016-10-03 MED ORDER — EPINEPHRINE 0.3 MG/0.3ML IJ SOAJ: 0.3000 mg | Freq: Once | INTRAMUSCULAR | 3 refills | Status: AC

## 2016-10-03 NOTE — Progress Notes (Signed)
Patient presents to clinic today to re-establish care and for acute issue.  SUBJECTIVE: PMH:Pt is a 41 yo AAF with pmh sig for, obesity, GERD, arthritis.  Pt was formerly seen by Dutch Quint, NP.  Pt seen by the [facility] in [place].  Pt has also been seen by this practice in the past.  Pt had a SVD 3 months ago and is currently breastfeeding.  HTN, acute: -delivered 1 wk early 2/2 HTN -BP elevated at 6 wk pp check, 150/130.  Pt had a HA at the time. -OB advised pt to est care with PCP. -Pt denies recent HA, blurred vision, seeing spots, or N/V, but does endorse a HA starting.  The HA is similar to the previous one when bp was elevated.  Described as a throbbing, frontal HA, 7/10 pain.  GI Issues: -pt has h/o GERD, EGD in 2010 with stomach ulcer and H. Pylori infection -is taking protonix 40 mg daily -notes stomach pain with certain foods and wine. -Was followed by Dr. Al Decant with [facility]. -Also notes h/o gallstones.  Arthritis: -pain in hands, spine, and knees. -Was taking Ibuprofen 800 mg, until recently when she realized how much she was taking. -Pt has an allergy to Tylenol -wants to exercise, but its hard when you are hurting.  Allergies: - hydrocodone-itching, hives  - acetaminophen (tylenol cold) - hives, itching.   Pt has not tried regular tyleonol. -Guaifaenesin- hives, itching -Pseudophedrine- hives, itching -Shellfish (oysters, shrimp)-hives, itching  Surgical hx: -neck discectomy in 2010  Social Hx: Pt has been married for 13 yrs, but has been with her husband for the last 23 yrs.  Pt has 3 kids, including a newborn who is 66 months old.  Pt served in the [employer] for 4 yrs.  She is also seen by the [facility] in [place]. Pt denies tobacco, EtOH, or drug use.    Past Medical History:  Diagnosis Date  . Allergy   . Anal fissure   . Arthritis   . Asthma   . Blood in stool   . Depression   . GERD (gastroesophageal reflux disease)   . Hypertension   . SVD  (spontaneous vaginal delivery) 07/15/2016  . Ulcer     Past Surgical History:  Procedure Laterality Date  . CERVICAL DISCECTOMY  03-2008  . COLONOSCOPY  2010    Current Outpatient Prescriptions on File Prior to Visit  Medication Sig Dispense Refill  . budesonide-formoterol (SYMBICORT) 80-4.5 MCG/ACT inhaler Inhale 2 puffs into the lungs 2 (two) times daily.    . diclofenac (VOLTAREN) 75 MG EC tablet Take 1 tablet (75 mg total) by mouth 2 (two) times daily. 30 tablet 0  . fluticasone (FLONASE) 50 MCG/ACT nasal spray Place 1 spray into both nostrils daily.    Marland Kitchen ibuprofen (ADVIL,MOTRIN) 800 MG tablet Take 1 tablet (800 mg total) by mouth every 8 (eight) hours as needed. 45 tablet 1  . montelukast (SINGULAIR) 10 MG tablet     . pantoprazole (PROTONIX) 40 MG tablet Take 40 mg by mouth daily.    . Prenatal Vit-Fe Fumarate-FA (PRENATAL MULTIVITAMIN) TABS tablet Take 1 tablet by mouth daily at 12 noon. 100 tablet 3  . triamcinolone ointment (KENALOG) 0.1 % Apply 1 application topically as needed.     No current facility-administered medications on file prior to visit.     Allergies  Allergen Reactions  . Hydrocodone Itching  . Acetaminophen Hives and Itching  . Guaifenesin Hives and Itching  .  Pseudoephedrine Hives, Itching and Other (See Comments)    Burning  . Shellfish Allergy Hives and Itching    Family History  Problem Relation Age of Onset  . Breast cancer Maternal Grandmother   . Ovarian cancer Mother   . Diabetes Paternal Grandmother   . Hypertension Father   . Colon cancer Neg Hx   . Esophageal cancer Neg Hx   . Stomach cancer Neg Hx   . Kidney disease Neg Hx   . Liver disease Neg Hx     Social History   Social History  . Marital status: Married    Spouse name: N/A  . Number of children: N/A  . Years of education: N/A   Occupational History  . Not on file.   Social History Main Topics  . Smoking status: Never Smoker  . Smokeless tobacco: Former Neurosurgeon  .  Alcohol use Yes     Comment: occasional  . Drug use: No  . Sexual activity: Yes   Other Topics Concern  . Not on file   Social History Narrative  . No narrative on file    ROS  General: Denies fever, chills, night sweats, changes in weight, changes in appetite HEENT: Denies ear pain, changes in vision, rhinorrhea, sore throat.  +HA CV: Denies CP, palpitations, SOB, orthopnea Pulm: Denies SOB, cough, wheezing GI: Denies abdominal pain, nausea, vomiting, diarrhea, constipation GU: Denies dysuria, hematuria, frequency, vaginal discharge Msk: Denies muscle cramps   +joint pain. Neuro: Denies weakness, numbness, tingling Skin: Denies rashes, bruising Psych: Denies depression, anxiety, hallucinations   BP 130/90 (BP Location: Left Arm, Patient Position: Sitting, Cuff Size: Normal)   Temp 98.3 F (36.8 C) (Oral)   Ht 5\' 5"  (1.651 m)   Wt 208 lb 11.2 oz (94.7 kg)   Breastfeeding? Yes   BMI 34.73 kg/m   Physical Exam  Gen. Pleasant, well developed, well-nourished, in NAD HEENT - /AT, PERRL, EOMI, conjunctive clear, no scleral icterus, no nasal drainage, pharynx without erythema or exudate. Neck: No JVD, no thyromegaly Lungs: no use of accessory muscles CTAB, no wheezes, rales or rhonchi Cardiovascular: bradycardic, No r/g/m, no peripheral edema Abdomen: BS present, soft, nontender,nondistended, no hepatosplenomegaly Musculoskeletal: No deformities, moves all four extremities, no cyanosis or clubbing, normal tone Neuro:  A&Ox3, CN II-XII intact, normal gait Skin:  Warm, dry, intact, no lesions Psych: normal affect, mood appropriate  Recent Results (from the past 2160 hour(s))  CBC     Status: Abnormal   Collection Time: 07/15/16  2:21 PM  Result Value Ref Range   WBC 6.7 4.0 - 10.5 K/uL   RBC 3.52 (L) 3.87 - 5.11 MIL/uL   Hemoglobin 10.4 (L) 12.0 - 15.0 g/dL   HCT 09/15/16 (L) 38.0 - 13.0 %   MCV 89.8 78.0 - 100.0 fL   MCH 29.5 26.0 - 34.0 pg   MCHC 32.9 30.0 - 36.0 g/dL     RDW 17.5 12.0 - 47.3 %   Platelets 244 150 - 400 K/uL  Type and screen [facility]     Status: None   Collection Time: 07/15/16  2:21 PM  Result Value Ref Range   ABO/RH(D) AB POS    Antibody Screen NEG    Sample Expiration 07/18/2016   RPR     Status: None   Collection Time: 07/15/16  2:21 PM  Result Value Ref Range   RPR Ser Ql Non Reactive Non Reactive    Comment: (NOTE) Performed At: [facility] [id number]  [place], [place] [id number] Lindon Romp MD MB:[id number]   Comprehensive metabolic panel     Status: Abnormal   Collection Time: 07/15/16  2:21 PM  Result Value Ref Range   Sodium 133 (L) 135 - 145 mmol/L   Potassium 4.0 3.5 - 5.1 mmol/L   Chloride 105 101 - 111 mmol/L   CO2 20 (L) 22 - 32 mmol/L   Glucose, Bld 78 65 - 99 mg/dL   BUN 6 6 - 20 mg/dL   Creatinine, Ser 0.65 0.44 - 1.00 mg/dL   Calcium 9.4 8.9 - 10.3 mg/dL   Total Protein 6.6 6.5 - 8.1 g/dL   Albumin 2.7 (L) 3.5 - 5.0 g/dL   AST 33 15 - 41 U/L   ALT 47 14 - 54 U/L   Alkaline Phosphatase 139 (H) 38 - 126 U/L   Total Bilirubin 0.8 0.3 - 1.2 mg/dL   GFR calc non Af Amer >60 >60 mL/min   GFR calc Af Amer >60 >60 mL/min    Comment: (NOTE) The eGFR has been calculated using the CKD EPI equation. This calculation has not been validated in all clinical situations. eGFR's persistently <60 mL/min signify possible Chronic Kidney Disease.    Anion gap 8 5 - 15  Protein / creatinine ratio, urine     Status: None   Collection Time: 07/15/16  5:10 PM  Result Value Ref Range   Creatinine, Urine 63.00 mg/dL   Total Protein, Urine 8 mg/dL    Comment: NO NORMAL RANGE ESTABLISHED FOR THIS TEST   Protein Creatinine Ratio 0.13 0.00 - 0.15 mg/mg[Cre]  CBC     Status: Abnormal   Collection Time: 07/16/16  4:58 AM  Result Value Ref Range   WBC 10.3 4.0 - 10.5 K/uL   RBC 3.33 (L) 3.87 - 5.11 MIL/uL   Hemoglobin 10.0 (L) 12.0 - 15.0 g/dL   HCT 29.5 (L) 36.0 - 46.0 %   MCV 88.6  78.0 - 100.0 fL   MCH 30.0 26.0 - 34.0 pg   MCHC 33.9 30.0 - 36.0 g/dL   RDW 14.2 11.5 - 15.5 %   Platelets 231 150 - 400 K/uL    Assessment/Plan: Hypertension, postpartum condition or complication  - Plan: NIFEdipine (PROCARDIA-XL/ADALAT-CC/NIFEDICAL-XL) 30 MG 24 hr tablet -f/u in 1 wk for bp check  Abnormality of heart beat  -EKG reviewed - Plan: EKG 12-Lead, CBC with Differential/Platelet, Comprehensive metabolic panel, TSH, T4, Free  History of multiple allergies  - Plan: EPINEPHrine 0.3 mg/0.3 mL IJ SOAJ injection  Arthritis -discussed alternatives for pain relief, such as water aerobics. -Discussed limiting Ibuprofen use and taking it with food.  Encounter to establish care -records release    Pt to visit lab tomorrow.  Will f/u in clinic in 1 wk for bp.

## 2016-10-03 NOTE — Patient Instructions (Addendum)
At today's visit labs were ordered.  The lab is open from 7:30 am-5:30pm each week day.   We have ordered labs at this visit. It can take up to 1-2 weeks for results and processing. If results require follow up or explanation, we will call you with instructions. Clinically stable results will be released to your [facility] or sent to you via mail. If you have not heard from [place] or cannot find your results in [facility] in 2 weeks please contact our office at [phone].    Hypertension Hypertension is another name for high blood pressure. High blood pressure forces your heart to work harder to pump blood. This can cause problems over time. There are two numbers in a blood pressure reading. There is a top number (systolic) over a bottom number (diastolic). It is best to have a blood pressure below 120/80. Healthy choices can help lower your blood pressure. You may need medicine to help lower your blood pressure if:  Your blood pressure cannot be lowered with healthy choices.  Your blood pressure is higher than 130/80.  Follow these instructions at home: Eating and drinking  If directed, follow the DASH eating plan. This diet includes: ? Filling half of your plate at each meal with fruits and vegetables. ? Filling one quarter of your plate at each meal with whole grains. Whole grains include whole wheat pasta, brown rice, and whole grain bread. ? Eating or drinking low-fat dairy products, such as skim milk or low-fat yogurt. ? Filling one quarter of your plate at each meal with low-fat (lean) proteins. Low-fat proteins include fish, skinless chicken, eggs, beans, and tofu. ? Avoiding fatty meat, cured and processed meat, or chicken with skin. ? Avoiding premade or processed food.  Eat less than 1,500 mg of salt (sodium) a day.  Limit alcohol use to no more than 1 drink a day for nonpregnant women and 2 drinks a day for men. One drink equals 12 oz of beer, 5 oz of wine, or 1 oz of hard  liquor. Lifestyle  Work with your doctor to stay at a healthy weight or to lose weight. Ask your doctor what the best weight is for you.  Get at least 30 minutes of exercise that causes your heart to beat faster (aerobic exercise) most days of the week. This may include walking, swimming, or biking.  Get at least 30 minutes of exercise that strengthens your muscles (resistance exercise) at least 3 days a week. This may include lifting weights or pilates.  Do not use any products that contain nicotine or tobacco. This includes cigarettes and e-cigarettes. If you need help quitting, ask your doctor.  Check your blood pressure at home as told by your doctor.  Keep all follow-up visits as told by your doctor. This is important. Medicines  Take over-the-counter and prescription medicines only as told by your doctor. Follow directions carefully.  Do not skip doses of blood pressure medicine. The medicine does not work as well if you skip doses. Skipping doses also puts you at risk for problems.  Ask your doctor about side effects or reactions to medicines that you should watch for. Contact a doctor if:  You think you are having a reaction to the medicine you are taking.  You have headaches that keep coming back (recurring).  You feel dizzy.  You have swelling in your ankles.  You have trouble with your vision. Get help right away if:  You get a very bad headache.  You start to feel confused.  You feel weak or numb.  You feel faint.  You get very bad pain in your: ? Chest. ? Belly (abdomen).  You throw up (vomit) more than once.  You have trouble breathing. Summary  Hypertension is another name for high blood pressure.  Making healthy choices can help lower blood pressure. If your blood pressure cannot be controlled with healthy choices, you may need to take medicine. This information is not intended to replace advice given to you by your health care provider. Make sure  you discuss any questions you have with your health care provider. Document Released: 06/15/2007 Document Revised: 11/25/2015 Document Reviewed: 11/25/2015 Elsevier Interactive Patient Education  2018 [employer].  Postpartum Hypertension Postpartum hypertension is high blood pressure after pregnancy that remains higher than normal for more than two days after delivery. You may not realize that you have postpartum hypertension if your blood pressure is not being checked regularly. In some cases, postpartum hypertension will go away on its own, usually within a week of delivery. However, for some women, medical treatment is required to prevent serious complications, such as seizures or stroke. The following things can affect your blood pressure:  The type of delivery you had.  Having received IV fluids or other medicines during or after delivery.  What are the causes? Postpartum hypertension may be caused by any of the following or by a combination of any of the following:  Hypertension that existed before pregnancy (chronic hypertension).  Gestational hypertension.  Preeclampsia or eclampsia.  Receiving a lot of fluid through an IV during or after delivery.  Medicines.  HELLP syndrome.  Hyperthyroidism.  Stroke.  Other rare neurological or blood disorders.  In some cases, the cause may not be known. What increases the risk? Postpartum hypertension can be related to one or more risk factors, such as:  Chronic hypertension. In some cases, this may not have been diagnosed before pregnancy.  Obesity.  Type 2 diabetes.  Kidney disease.  Family history of preeclampsia.  Other medical conditions that cause hormonal imbalances.  What are the signs or symptoms? As with all types of hypertension, postpartum hypertension may not have any symptoms. Depending on how high your blood pressure is, you may experience:  Headaches. These may be mild, moderate, or severe. They may  also be steady, constant, or sudden in onset (thunderclap headache).  Visual changes.  Dizziness.  Shortness of breath.  Swelling of your hands, feet, lower legs, or face. In some cases, you may have swelling in more than one of these locations.  Heart palpitations or a racing heartbeat.  Difficulty breathing while lying down.  Decreased urination.  Other rare signs and symptoms may include:  Sweating more than usual. This lasts longer than a few days after delivery.  Chest pain.  Sudden dizziness when you get up from sitting or lying down.  Seizures.  Nausea or vomiting.  Abdominal pain.  How is this diagnosed? The diagnosis of postpartum hypertension is made through a combination of physical examination findings and testing of your blood and urine. You may also have additional tests, such as a CT scan or an MRI, to check for other complications of postpartum hypertension. How is this treated? When blood pressure is high enough to require treatment, your options may include:  Medicines to reduce blood pressure (antihypertensives). Tell your health care provider if you are breastfeeding or if you plan to breastfeed. There are many antihypertensive medicines that are safe to  take while breastfeeding.  Stopping medicines that may be causing hypertension.  Treating medical conditions that are causing hypertension.  Treating the complications of hypertension, such as seizures, stroke, or kidney problems.  Your health care provider will also continue to monitor your blood pressure closely and repeatedly until it is within a safe range for you. Follow these instructions at home:  Take medicines only as directed by your health care provider.  Get regular exercise after your health care provider tells you that it is safe.  Follow your health care provider's recommendations on fluid and salt restrictions.  Do not use any tobacco products, including cigarettes, chewing  tobacco, or electronic cigarettes. If you need help quitting, ask your health care provider.  Keep all follow-up visits as directed by your health care provider. This is important. Contact a health care provider if:  Your symptoms get worse.  You have new symptoms, such as: ? Headache. ? Dizziness. ? Visual changes. Get help right away if:  You develop a severe or sudden headache.  You have seizures.  You develop numbness or weakness on one side of your body.  You have difficulty thinking, speaking, or swallowing.  You develop severe abdominal pain.  You develop difficulty breathing, chest pain, a racing heartbeat, or heart palpitations. These symptoms may represent a serious problem that is an emergency. Do not wait to see if the symptoms will go away. Get medical help right away. Call your local emergency services (911 in the [place] Do not drive yourself to the hospital. This information is not intended to replace advice given to you by your health care provider. Make sure you discuss any questions you have with your health care provider. Document Released: 08/30/2013 Document Revised: 06/01/2015 Document Reviewed: 07/11/2013 Elsevier Interactive Patient Education  [employer].

## 2017-01-27 DIAGNOSIS — R937 Abnormal findings on diagnostic imaging of other parts of musculoskeletal system: Secondary | ICD-10-CM | POA: Diagnosis not present

## 2017-01-27 DIAGNOSIS — Z0189 Encounter for other specified special examinations: Secondary | ICD-10-CM | POA: Diagnosis not present

## 2017-01-27 DIAGNOSIS — M549 Dorsalgia, unspecified: Secondary | ICD-10-CM | POA: Diagnosis not present

## 2017-03-01 ENCOUNTER — Ambulatory Visit: Payer: BLUE CROSS/BLUE SHIELD | Admitting: Family Medicine

## 2017-03-03 ENCOUNTER — Ambulatory Visit: Payer: BLUE CROSS/BLUE SHIELD | Admitting: Family Medicine

## 2017-03-03 ENCOUNTER — Encounter: Payer: Self-pay | Admitting: Family Medicine

## 2017-03-03 VITALS — BP 126/92 | HR 78 | Temp 98.1°F | Wt 212.0 lb

## 2017-03-03 DIAGNOSIS — M1990 Unspecified osteoarthritis, unspecified site: Secondary | ICD-10-CM | POA: Diagnosis not present

## 2017-03-03 DIAGNOSIS — R1013 Epigastric pain: Secondary | ICD-10-CM

## 2017-03-03 DIAGNOSIS — G5622 Lesion of ulnar nerve, left upper limb: Secondary | ICD-10-CM

## 2017-03-03 DIAGNOSIS — I10 Essential (primary) hypertension: Secondary | ICD-10-CM | POA: Diagnosis not present

## 2017-03-03 LAB — SEDIMENTATION RATE: Sed Rate: 14 mm/hr (ref 0–20)

## 2017-03-03 LAB — VITAMIN B12: Vitamin B-12: 499 pg/mL (ref 211–911)

## 2017-03-03 NOTE — Progress Notes (Signed)
Subjective:    Patient ID: [name], female    DOB: 1975-07-20, 42 y.o.   MRN: [id number]  No chief complaint on file.   HPI Patient was seen today for ongoing concern.  Patient endorses neck and arm pain, weakness in bilateral hands, joint pain and edema in hands..  Patient was started on gabapentin 600 mg 3 times daily by another provider.  [facility] she states that they will not see her for her neck and arm issues only her lower back.  Patient has a history of cervical spine surgery in 2010.  Since then she endorses left elbow and hand pain on a regular basis.  Patient endorses continued stomach issues.  Patient has a history of GERD, stomach ulcer and H. pylori infection.  She had an EGD in 2010 at [facility].  She states she may eat 1 time per day because of her stomach discomfort.  Patient endorses after 2 or 3 bites feeling bloating, diarrhea, epigastric pain.  Patient has been on Protonix 40 mg with some relief.  Patient also endorses a history of gallstones.  She is tried a combination of olive oil and lemon juice to help decrease her symptoms associated with gallbladder pain.  Hypertension.  Patient was previously on Procardia shortly after delivery of her child.  Patient states she has since stopped the medication because she read stuff online about blood pressure medicines being recalled. Past Medical History:  Diagnosis Date  . Allergy   . Anal fissure   . Arthritis   . Asthma   . Blood in stool   . Depression   . GERD (gastroesophageal reflux disease)   . Hypertension   . SVD (spontaneous vaginal delivery) 07/15/2016  . Ulcer     Allergies  Allergen Reactions  . Hydrocodone Itching  . Acetaminophen Hives and Itching  . Guaifenesin Hives and Itching  . Pseudoephedrine Hives, Itching and Other (See Comments)    Burning  . Shellfish Allergy Hives and Itching    ROS General: Denies fever, chills, night sweats, changes in  weight, changes in appetite HEENT: Denies headaches, ear pain, changes in vision, rhinorrhea, sore throat CV: Denies CP, palpitations, SOB, orthopnea Pulm: Denies SOB, cough, wheezing GI: Denies vomiting, constipation  + abdominal pain, nausea, diarrhea, history of gallstones GU: Denies dysuria, hematuria, frequency, vaginal discharge Msk: Denies muscle cramps    +joint pains, joint edema Neuro: Denies weakness    +numbness, tingling Skin: Denies rashes, bruising Psych: Denies depression, anxiety, hallucinations     Objective:    Blood pressure (!) 126/92, pulse 78, temperature 98.1 F (36.7 C), temperature source Oral, weight 212 lb (96.2 kg), SpO2 98 %, currently breastfeeding.   Gen. Pleasant, well-nourished, in no distress, normal affect  HEENT: Amo/AT, face symmetric, PERRLA, nares patent without drainage. Lungs: no accessory muscle use, CTAB, no wheezes or rales Cardiovascular: RRR, no m/r/g, no peripheral edema Abdomen: BS present, soft, NT/ND, no hepatosplenomegaly. Musculoskeletal: No deformities, no cyanosis or clubbing, normal tone.  Patient wearing a brace on her left wrist and hand.  Patient wearing a supportive glove on her right hand.  Positive Tinel's over L Guyon's canal.  Decreased grip strength bilaterally, pain with L grip.  No TTP of cervical spine, shoulders, elbows, wrist bilaterally. Neuro:  A&Ox3, CN II-XII intact, normal gait   Wt Readings from Last 3 Encounters:  03/03/17 212 lb (96.2 kg)  10/03/16 208 lb 11.2 oz (94.7 kg)  07/15/16 231 lb (104.8 kg)    Lab Results  Component Value Date   WBC 10.3 07/16/2016   HGB 10.0 (L) 07/16/2016   HCT 29.5 (L) 07/16/2016   PLT 231 07/16/2016   GLUCOSE 78 07/15/2016   CHOL 208 (H) 07/05/2011   TRIG 83.0 07/05/2011   HDL 55.70 07/05/2011   LDLDIRECT 131.2 07/05/2011   ALT 47 07/15/2016   AST 33 07/15/2016   NA 133 (L) 07/15/2016   K 4.0 07/15/2016   CL 105 07/15/2016   CREATININE 0.65 07/15/2016   BUN 6  07/15/2016   CO2 20 (L) 07/15/2016   TSH 2.02 07/05/2011    Assessment/Plan:  Entrapment of left ulnar nerve -Discussed considering EMG and nerve conduction studies -Continue gabapentin 600 mg 3 times daily -Continue wearing braces.  Okay to use Tylenol as needed -Consider steroid injection -Plan: Vitamin B12 -We will review records from previous providers in regards to cervical spine imaging  Epigastric pain -Continue Protonix 40 mg daily.  Consider increasing to twice daily. -Patient encouraged to follow-up with GI as she is seeing them in the past.  Has upcoming appointment. -Plan: US Abdomen Limited  Essential hypertension -Diastolic elevated -Patient educated on the various recalls for blood pressure medicine. -Patient encouraged to start lifestyle modifications including decreasing sodium intake, increasing physical activity, increasing p.o. intake of water, increasing p.o. intake of vegetables if she would like to avoid taking blood pressure medication. -Patient to check blood pressure at home and keep a log. -If BP remains elevated will need to restart BP medication. -We will have patient follow-up in the next few weeks for BP recheck.  Arthritis  - Plan: Rheumatoid Factor, Cyclic citrul peptide antibody, IgG, Sedimentation Rate   Follow-up in the next few weeks for BP recheck.  We will discuss other issues after testing.  Grier Mitts, MD

## 2017-03-03 NOTE — Patient Instructions (Addendum)
Abdominal Pain, Adult Abdominal pain can be caused by many things. Often, abdominal pain is not serious and it gets better with no treatment or by being treated at home. However, sometimes abdominal pain is serious. Your health care provider will do a medical history and a physical exam to try to determine the cause of your abdominal pain. Follow these instructions at home:  Take over-the-counter and prescription medicines only as told by your health care provider. Do not take a laxative unless told by your health care provider.  Drink enough fluid to keep your urine clear or pale yellow.  Watch your condition for any changes.  Keep all follow-up visits as told by your health care provider. This is important. Contact a health care provider if:  Your abdominal pain changes or gets worse.  You are not hungry or you lose weight without trying.  You are constipated or have diarrhea for more than 2-3 days.  You have pain when you urinate or have a bowel movement.  Your abdominal pain wakes you up at night.  Your pain gets worse with meals, after eating, or with certain foods.  You are throwing up and cannot keep anything down.  You have a fever. Get help right away if:  Your pain does not go away as soon as your health care provider told you to expect.  You cannot stop throwing up.  Your pain is only in areas of the abdomen, such as the right side or the left lower portion of the abdomen.  You have bloody or black stools, or stools that look like tar.  You have severe pain, cramping, or bloating in your abdomen.  You have signs of dehydration, such as: ? Dark urine, very little urine, or no urine. ? Cracked lips. ? Dry mouth. ? Sunken eyes. ? Sleepiness. ? Weakness. This information is not intended to replace advice given to you by your health care provider. Make sure you discuss any questions you have with your health care provider. Document Released: 10/06/2004 Document  Revised: 07/17/2015 Document Reviewed: 06/10/2015 Elsevier Interactive Patient Education  2018 [place].  Abdominal Bloating When you have abdominal bloating, your abdomen may feel full, tight, or painful. It may also look bigger than normal or swollen (distended). Common causes of abdominal bloating include:  Swallowing air.  Constipation.  Problems digesting food.  Eating too much.  Irritable bowel syndrome. This is a condition that affects the large intestine.  Lactose intolerance. This is an inability to digest lactose, a natural sugar in dairy products.  Celiac disease. This is a condition that affects the ability to digest gluten, a protein found in some grains.  Gastroparesis. This is a condition that slows down the movement of food in the stomach and small intestine. It is more common in people with diabetes mellitus.  Gastroesophageal reflux disease (GERD). This is a digestive condition that makes stomach acid flow back into the esophagus.  Urinary retention. This means that the body is holding onto urine, and the bladder cannot be emptied all the way.  Follow these instructions at home: Eating and drinking  Avoid eating too much.  Try not to swallow air while talking or eating.  Avoid eating while lying down.  Avoid these foods and drinks: ? Foods that cause gas, such as broccoli, cabbage, cauliflower, and baked beans. ? Carbonated drinks. ? Hard candy. ? Chewing gum. Medicines  Take over-the-counter and prescription medicines only as told by your health care provider.  Take  probiotic medicines. These medicines contain live bacteria or yeasts that can help digestion.  Take coated peppermint oil capsules. Activity  Try to exercise regularly. Exercise may help to relieve bloating that is caused by gas and relieve constipation. General instructions  Keep all follow-up visits as told by your health care provider. This is important. Contact a health care  provider if:  You have nausea and vomiting.  You have diarrhea.  You have abdominal pain.  You have unusual weight loss or weight gain.  You have severe pain, and medicines do not help. Get help right away if:  You have severe chest pain.  You have trouble breathing.  You have shortness of breath.  You have trouble urinating.  You have darker urine than normal.  You have blood in your stools or have dark, tarry stools. Summary  Abdominal bloating means that the abdomen is swollen.  Common causes of abdominal bloating are swallowing air, constipation, and problems digesting food.  Avoid eating too much and avoid swallowing air.  Avoid foods that cause gas, carbonated drinks, hard candy, and chewing gum. This information is not intended to replace advice given to you by your health care provider. Make sure you discuss any questions you have with your health care provider. Document Released: 01/29/2016 Document Revised: 01/29/2016 Document Reviewed: 01/29/2016 Elsevier Interactive Patient Education  2018 [place] Tunnel Syndrome Guyon tunnel syndrome, also known as cyclist's palsy, is a disorder that causes pain, weakness, and numbness in the wrist and the hand. Pain occurs in the outer (ulnar) part of the wrist and the palm of the hand, including the ring finger and pinkie. This condition happens when a nerve in the arm and hand (ulnar nerve) is stretched or squeezed (compressed) at the base of the hand. Over time, you may start to have symptoms with just a small amount of stress to your hand or wrist. When it takes less stress to cause symptoms than it used to, this is called sensitization. Guyon tunnel syndrome is common among cyclists, because gripping and leaning on bicycle handlebars for long periods of time can cause this condition and make it worse over time. What are the causes? This condition may be caused by:  Repetitive pressure on the hands and  wrists.  An injuryto the base of the hand that causes swelling or a break (fracture) in a wrist bone (hamate bone).  What increases the risk? The following factors may make you more likely to develop this condition:  Having diabetes.  Having hypothyroidism.  Participating in activities that involve repeated impact, pressure, or vibration of the hands or wrists. These include certain sports, such as cycling, tennis, and martial arts.  Repeatedly bearing weight in your hands, such as when you use crutches.  Having gout or rheumatoid arthritis.  Having a ganglion cyst or lipoma near the base of the hand.  Having carpal tunnel syndrome.  What are the signs or symptoms? Symptoms of this condition may include:  Tingling, numbness, or a burning feeling in the ulnar side of the palm and in the ring finger and pinkie.  Pain in the hand or wrist. Pain may feel sharp, or it may feel like an ache.  Weakness in the hand. You may have difficulty gripping objects.  Involuntary bending of the ring finger and pinkie toward the palm (claw hand).  How is this diagnosed? This condition may be diagnosed based on:  A physical exam.  Your medical history.  Tests, such  as: ? Nerve conduction test. This test checks the quality of signals along your ulnar nerve. ? X-rays. ? CT scan. ? Ultrasound. This uses sound waves to make an image of your affected area.  How is this treated? Treatment for this condition may include:  Resting the injured area. This may include stopping or modifying any sports and physical activity for a period of time.  Icing the injured area.  Medicines that help to relieve pain and inflammation.  A splint to keep your wrist and hand in the proper position.  Physical therapy.  An injection of medicine (cortisone) that helps to reduce inflammation.  Surgery to relieve pressure on the ulnar nerve. This is done only in very severe cases.  Follow these  instructions at home: If you have a splint:  Do not put pressure on any part of the splint until it is fully hardened. This may take several hours.  Wear it as told by your health care provider. Remove it only as told by your health care provider.  Loosen the splint if your fingers tingle, become numb, or turn cold and blue.  Do not let your splint get wet if it is not waterproof. ? If your splint is not waterproof, cover it with a watertight covering when you take a bath or shower.  Keep the splint clean.  Ask your health care provider when it is safe for you to drive. Managing pain, stiffness, and swelling  If directed, apply ice to the injured area: ? Put ice in a plastic bag. ? Place a towel between your skin and the bag. ? Leave the ice on for 20 minutes, 2-3 times a day. Activity  Return to your normal activities as told by your health care provider. Ask your health care provider what activities are safe for you.  When you do activities that cause stress to your hands and wrists, try wearing padded gloves. Change your hand positions often, especially when you do these activities for a long time.  If physical therapy was prescribed, do exercises as told by your health care provider. General instructions  Take over-the-counter and prescription medicines only as told by your health care provider.  Keep all follow-up visits as told by your health care provider. This is important. Contact a health care provider if:  You have pain, tingling, or weakness that gets worse.  Your symptoms do not improve after 2 weeks of treatment. This information is not intended to replace advice given to you by your health care provider. Make sure you discuss any questions you have with your health care provider. Document Released: 12/27/2004 Document Revised: 08/26/2015 Document Reviewed: 09/08/2014 Elsevier Interactive Patient Education  2018 [place] Eating Plan DASH stands for  "Dietary Approaches to Stop Hypertension." The DASH eating plan is a healthy eating plan that has been shown to reduce high blood pressure (hypertension). It may also reduce your risk for type 2 diabetes, heart disease, and stroke. The DASH eating plan may also help with weight loss. What are tips for following this plan? General guidelines  Avoid eating more than 2,300 mg (milligrams) of salt (sodium) a day. If you have hypertension, you may need to reduce your sodium intake to 1,500 mg a day.  Limit alcohol intake to no more than 1 drink a day for nonpregnant women and 2 drinks a day for men. One drink equals 12 oz of beer, 5 oz of wine, or 1 oz of hard liquor.  Work  with your health care provider to maintain a healthy body weight or to lose weight. Ask what an ideal weight is for you.  Get at least 30 minutes of exercise that causes your heart to beat faster (aerobic exercise) most days of the week. Activities may include walking, swimming, or biking.  Work with your health care provider or diet and nutrition specialist (dietitian) to adjust your eating plan to your individual calorie needs. Reading food labels  Check food labels for the amount of sodium per serving. Choose foods with less than 5 percent of the Daily Value of sodium. Generally, foods with less than 300 mg of sodium per serving fit into this eating plan.  To find whole grains, look for the word "whole" as the first word in the ingredient list. Shopping  Buy products labeled as "low-sodium" or "no salt added."  Buy fresh foods. Avoid canned foods and premade or frozen meals. Cooking  Avoid adding salt when cooking. Use salt-free seasonings or herbs instead of table salt or sea salt. Check with your health care provider or pharmacist before using salt substitutes.  Do not fry foods. Cook foods using healthy methods such as baking, boiling, grilling, and broiling instead.  Cook with heart-healthy oils, such as olive,  canola, soybean, or sunflower oil. Meal planning   Eat a balanced diet that includes: ? 5 or more servings of fruits and vegetables each day. At each meal, try to fill half of your plate with fruits and vegetables. ? Up to 6-8 servings of whole grains each day. ? Less than 6 oz of lean meat, poultry, or fish each day. A 3-oz serving of meat is about the same size as a deck of cards. One egg equals 1 oz. ? 2 servings of low-fat dairy each day. ? A serving of nuts, seeds, or beans 5 times each week. ? Heart-healthy fats. Healthy fats called Omega-3 fatty acids are found in foods such as flaxseeds and coldwater fish, like sardines, salmon, and mackerel.  Limit how much you eat of the following: ? Canned or prepackaged foods. ? Food that is high in trans fat, such as fried foods. ? Food that is high in saturated fat, such as fatty meat. ? Sweets, desserts, sugary drinks, and other foods with added sugar. ? Full-fat dairy products.  Do not salt foods before eating.  Try to eat at least 2 vegetarian meals each week.  Eat more home-cooked food and less restaurant, buffet, and fast food.  When eating at a restaurant, ask that your food be prepared with less salt or no salt, if possible. What foods are recommended? The items listed may not be a complete list. Talk with your dietitian about what dietary choices are best for you. Grains Whole-grain or whole-wheat bread. Whole-grain or whole-wheat pasta. Brown rice. [place]. Bulgur. Whole-grain and low-sodium cereals. Pita bread. Low-fat, low-sodium crackers. Whole-wheat flour tortillas. Vegetables Fresh or frozen vegetables (raw, steamed, roasted, or grilled). Low-sodium or reduced-sodium tomato and vegetable juice. Low-sodium or reduced-sodium tomato sauce and tomato paste. Low-sodium or reduced-sodium canned vegetables. Fruits All fresh, dried, or frozen fruit. Canned fruit in natural juice (without added sugar). Meat and other  protein foods Skinless chicken or [place]. Ground chicken or [place]. Pork with fat trimmed off. Fish and seafood. Egg whites. Dried beans, peas, or lentils. Unsalted nuts, nut butters, and seeds. Unsalted canned beans. Lean cuts of beef with fat trimmed off. Low-sodium, lean deli meat. Dairy Low-fat (1%) or fat-free (skim) milk.  Fat-free, low-fat, or reduced-fat cheeses. Nonfat, low-sodium ricotta or cottage cheese. Low-fat or nonfat yogurt. Low-fat, low-sodium cheese. Fats and oils Soft margarine without trans fats. Vegetable oil. Low-fat, reduced-fat, or light mayonnaise and salad dressings (reduced-sodium). Canola, safflower, olive, soybean, and sunflower oils. Avocado. Seasoning and other foods Herbs. Spices. Seasoning mixes without salt. Unsalted popcorn and pretzels. Fat-free sweets. What foods are not recommended? The items listed may not be a complete list. Talk with your dietitian about what dietary choices are best for you. Grains Baked goods made with fat, such as croissants, muffins, or some breads. Dry pasta or rice meal packs. Vegetables Creamed or fried vegetables. Vegetables in a cheese sauce. Regular canned vegetables (not low-sodium or reduced-sodium). Regular canned tomato sauce and paste (not low-sodium or reduced-sodium). Regular tomato and vegetable juice (not low-sodium or reduced-sodium). Angie Fava. Olives. Fruits Canned fruit in a light or heavy syrup. Fried fruit. Fruit in cream or butter sauce. Meat and other protein foods Fatty cuts of meat. Ribs. Fried meat. Berniece Salines. Sausage. Bologna and other processed lunch meats. Salami. Fatback. Hotdogs. Bratwurst. Salted nuts and seeds. Canned beans with added salt. Canned or smoked fish. Whole eggs or egg yolks. Chicken or [place] with skin. Dairy Whole or 2% milk, cream, and half-and-half. Whole or full-fat cream cheese. Whole-fat or sweetened yogurt. Full-fat cheese. Nondairy creamers. Whipped toppings. Processed cheese and cheese  spreads. Fats and oils Butter. Stick margarine. Lard. Shortening. Ghee. Bacon fat. Tropical oils, such as coconut, palm kernel, or palm oil. Seasoning and other foods Salted popcorn and pretzels. Onion salt, garlic salt, seasoned salt, table salt, and sea salt. Worcestershire sauce. Tartar sauce. Barbecue sauce. Teriyaki sauce. Soy sauce, including reduced-sodium. Steak sauce. Canned and packaged gravies. Fish sauce. Oyster sauce. Cocktail sauce. Horseradish that you find on the shelf. Ketchup. Mustard. Meat flavorings and tenderizers. Bouillon cubes. Hot sauce and Tabasco sauce. Premade or packaged marinades. Premade or packaged taco seasonings. Relishes. Regular salad dressings. Where to find more information:  National Heart, Lung, and [facility] Sandwich: https://wilson-eaton.com/  American Heart Association: www.heart.org Summary  The DASH eating plan is a healthy eating plan that has been shown to reduce high blood pressure (hypertension). It may also reduce your risk for type 2 diabetes, heart disease, and stroke.  With the DASH eating plan, you should limit salt (sodium) intake to 2,300 mg a day. If you have hypertension, you may need to reduce your sodium intake to 1,500 mg a day.  When on the DASH eating plan, aim to eat more fresh fruits and vegetables, whole grains, lean proteins, low-fat dairy, and heart-healthy fats.  Work with your health care provider or diet and nutrition specialist (dietitian) to adjust your eating plan to your individual calorie needs. This information is not intended to replace advice given to you by your health care provider. Make sure you discuss any questions you have with your health care provider. Document Released: 12/16/2010 Document Revised: 12/21/2015 Document Reviewed: 12/21/2015 Elsevier Interactive Patient Education  [employer].

## 2017-03-05 ENCOUNTER — Encounter: Payer: Self-pay | Admitting: Family Medicine

## 2017-03-06 LAB — CYCLIC CITRUL PEPTIDE ANTIBODY, IGG: Cyclic Citrullin Peptide Ab: <16 UNITS

## 2017-03-06 LAB — RHEUMATOID FACTOR: Rhuematoid fact SerPl-aCnc: <14 IU/mL (ref <14)

## 2017-03-07 ENCOUNTER — Ambulatory Visit
Admission: RE | Admit: 2017-03-07 | Discharge: 2017-03-07 | Disposition: A | Discharge status: Home / Self Care | Payer: BLUE CROSS/BLUE SHIELD | Source: Ambulatory Visit | Attending: Family Medicine | Admitting: Family Medicine

## 2017-03-07 DIAGNOSIS — R1013 Epigastric pain: Secondary | ICD-10-CM

## 2017-03-07 DIAGNOSIS — K8020 Calculus of gallbladder without cholecystitis without obstruction: Secondary | ICD-10-CM | POA: Diagnosis not present

## 2017-03-08 ENCOUNTER — Encounter: Payer: Self-pay | Admitting: Family Medicine

## 2017-03-08 ENCOUNTER — Other Ambulatory Visit: Payer: Self-pay | Admitting: Family Medicine

## 2017-03-08 DIAGNOSIS — K8020 Calculus of gallbladder without cholecystitis without obstruction: Secondary | ICD-10-CM

## 2017-03-14 ENCOUNTER — Ambulatory Visit: Payer: BLUE CROSS/BLUE SHIELD | Admitting: Nurse Practitioner

## 2017-03-20 ENCOUNTER — Ambulatory Visit: Payer: BLUE CROSS/BLUE SHIELD | Admitting: Nurse Practitioner

## 2017-03-20 ENCOUNTER — Encounter: Payer: Self-pay | Admitting: Nurse Practitioner

## 2017-03-20 VITALS — BP 102/70 | HR 78 | Ht 65.25 in | Wt 211.0 lb

## 2017-03-20 DIAGNOSIS — R1010 Upper abdominal pain, unspecified: Secondary | ICD-10-CM

## 2017-03-20 DIAGNOSIS — K921 Melena: Secondary | ICD-10-CM

## 2017-03-20 DIAGNOSIS — R140 Abdominal distension (gaseous): Secondary | ICD-10-CM

## 2017-03-20 DIAGNOSIS — K625 Hemorrhage of anus and rectum: Secondary | ICD-10-CM | POA: Diagnosis not present

## 2017-03-20 DIAGNOSIS — R6881 Early satiety: Secondary | ICD-10-CM

## 2017-03-20 DIAGNOSIS — R194 Change in bowel habit: Secondary | ICD-10-CM | POA: Diagnosis not present

## 2017-03-20 MED ORDER — PEG 3350-KCL-NA BICARB-NACL 420 G PO SOLR: 4000.0000 mL | Freq: Once | ORAL | 0 refills | Status: AC

## 2017-03-20 MED ORDER — RANITIDINE HCL 150 MG PO TABS: 150.0000 mg | ORAL_TABLET | ORAL | 2 refills | Status: DC

## 2017-03-20 NOTE — Progress Notes (Signed)
Referring Provider: Grier Mitts, MD  Reason for referral: abdominal pain , bloating    IMPRESSION and PLAN:    #1. 42 yo female with chronic intermittent upper abdominal pain / bloating / early satiety. No associated weight loss or vomiting.  She has known cholelithiasis, has appt for surgical evaluation on Wed.  -await surgical evaluation.. If not biliary then I suspect this is non-ulcer dyspepsia.  -She is concerned about taking PPI's. Recommended trial of Zantac 150 mg daily.  -patient will let us know what happens with Surgery and if Zantac helps.    #2.  Bowel changes /blood in stool. Following birth of baby in July bowel movements changed with stools becoming more loose in consistency and associated with painless rectal bleeding.Suspect bleeding was perianal, she has a hx of internal hemorrhoids.  -Bowel movements have normalized and no bleeding in a couple of months. However, patient to see Surgery for cholelithiasis and they would probably want evaluation of the bleeding prior to an elective cholecystectomy.  Therefore will schedule patient for a colonoscopy. The risks and benefits of the procedure were discussed and the patient agrees to proceed.   #3. Right liver lobe lesion measuring 0.8 x 1.0 x 1.0  Cm on ultrasound, suspected to be hemangioma   HPI:    Chief Complaint:  Bloating, gas, abdominal pain    Patient is a 42 year old female who we have previously evaluated for multiple GI issues including GERD / dysphagia, upper abdominal pain, bloating, early satiety,  and altered bowel habits.  She was seen in 2015 for a reoccurrence of most of these same symptoms. She had discontinued PPI therapy, we resumed it. We recommended fiber to regulate bowels and ordered an abdominal ultrasound which showed cholelithiasis. Patient was given a surgical referral , not sure if she went to the appt  Of note, reviewed Care Everywhere. Patient seen by GI at [facility] in December 2017  for upper abdominal pain. She was apparently unable to be seen by [place] soon enough so made appt there instead. U/s repeated, showed cholelithiasis again. . Plan for for EGD but patient had positive pregnancy test so EGD never done.  Monaye returns here after nearly 4 years later with recurrent postprandial abdominal pain / bloating. The pain is mainly epigastric, non-radiating. Consumption of fatty foods often causes right mid back pain.  PCP recently repeated u/s which again  showed gallstones, no new findings. Patient to see Surgery Wed to evaluate for symptomatic cholelithiasis. Ibtisam stopped her PPI again about a year ago because it no longer seemed to be helping. Additionally she was concerned about side effects of PPIs. She started eating smaller portions of food which did help some. Also, she has been able to determine which foods are most problematic and has tried to avoid them. She isn't taking NSAIDS now but had been taking generous amounts of ibuprofen until about 6 months ago. . Diclofenac and advil on home medication list but she isn't taking either one.    When PCP suggested surgical referral Riniyah was initially hesitant about potentially having gallbladder removed. She did a "gallbladder flush" consisting of epsom salt, olive oil and lemon. Patient believes she passed some of the gallstones in her stool pain did temporarily Iimprove following the flush   Patient had some bowel changes with loose stool and rectal bleeding following birth of child in July. Treated for hemorrhoids and no bleeding in two months. No Wilson of colon cancer. Stools have since  normalized   Review of systems:  Positive for joint pain. All other systems reviewed and negative except where noted in HPI.      Past Medical History:  Diagnosis Date  . Allergy   . Anal fissure   . Arthritis   . Asthma   . Blood in stool   . Depression   . GERD (gastroesophageal reflux disease)   . Hypertension   . SVD  (spontaneous vaginal delivery) 07/15/2016  . Ulcer    Past Surgical History:  Procedure Laterality Date  . CERVICAL DISCECTOMY  03-2008  . COLONOSCOPY  2010   Family History  Problem Relation Age of Onset  . Breast cancer Maternal Grandmother   . Ovarian cancer Mother   . Diabetes Paternal Grandmother   . Hypertension Father   . Colon cancer Neg Hx   . Esophageal cancer Neg Hx   . Stomach cancer Neg Hx   . Kidney disease Neg Hx   . Liver disease Neg Hx     Patient's surgical history, family medical history, social history, medications and allergies were all reviewed in Epic    Physical Exam:     BP 102/70   Pulse 78   Ht 5' 5.25" (1.657 m)   Wt 211 lb (95.7 kg)   BMI 34.84 kg/m   GENERAL:  Black well nourished female in NAD PSYCH: :Pleasant, cooperative, normal affect EENT:  conjunctiva pink, mucous membranes moist, neck supple without masses CARDIAC:  RRR, no murmur heard, no peripheral edema PULM: Normal respiratory effort, lungs CTA bilaterally, no wheezing ABDOMEN:  Nondistended, soft, nontender. No obvious masses, no hepatomegaly,  normal bowel sounds SKIN:  turgor, no lesions seen Musculoskeletal:  Normal muscle tone, normal strength NEURO: Alert and oriented x 3, no focal neurologic deficits   Tye Savoy , NP 03/20/2017, 10:07 AM   Cc:  Grier Mitts, MD

## 2017-03-20 NOTE — Patient Instructions (Signed)
If you are age 41 or older, your body mass index should be between 23-30. Your Body mass index is 34.84 kg/m. If this is out of the aforementioned range listed, please consider follow up with your Primary Care Provider.  If you are age 31 or younger, your body mass index should be between 19-25. Your Body mass index is 34.84 kg/m. If this is out of the aformentioned range listed, please consider follow up with your Primary Care Provider.   You have been scheduled for a colonoscopy. Please follow written instructions given to you at your visit today.  Please pick up your prep supplies at the pharmacy within the next 1-3 days. If you use inhalers (even only as needed), please bring them with you on the day of your procedure. Your physician has requested that you go to www.startemmi.com and enter the access code given to you at your visit today. This web site gives a general overview about your procedure. However, you should still follow specific instructions given to you by our office regarding your preparation for the procedure.  We have sent the following medications to your pharmacy for you to pick up at your convenience: Golytely Zantac  150 mg  Thank you for choosing me and [facility].   Tye Savoy, NP

## 2017-03-21 NOTE — Progress Notes (Signed)
I agree with the above note, plan 

## 2017-03-22 ENCOUNTER — Ambulatory Visit: Payer: Self-pay | Admitting: Surgery

## 2017-03-22 DIAGNOSIS — K8020 Calculus of gallbladder without cholecystitis without obstruction: Secondary | ICD-10-CM | POA: Diagnosis not present

## 2017-03-22 NOTE — H&P (Signed)
[name] Documented: 03/22/2017 11:24 AM Location: [facility] Patient #: [id number] DOB: [birth year] Married / Language: English / Race: Black or African American Female  History of Present Illness (Chelsea A. Kae Heller MD; 03/22/2017 11:47 AM) Patient words: 42 year old woman who is followed by Dr. Ardis Hughs and Tye Savoy with [facility] gastroenterology where she has been previously evaluated for multiple GI issues including GERD, dysphagia, upper abdominal pain, bloating, early satiety and altered bowel habits. She has also been evaluated a [facility] for the same. At this point she is slated to undergo colonoscopy for recent history of rectal bleeding (now resolved) which is started on the time of the birth of her baby 8 months ago. She has had known gallstones that sound like she has been hesitant to pursue surgery. Her symptoms include epigastric pain after eating, occasionally feels like things get stuck here, bloating, loss of appetite and occasional nausea. She also describes inconsistency of her bowel habits, frequently with diarrhea but occasionally the other. When she first and she had gallstones she did a "detox "where she drank water with Epsom salts followed by extruding olive oil and lemon juice and she states that she could feel the gallstones passing in her stool which I have assured her was not the case. Ultrasound on February 26 shows gallstones with no signs of cholecystitis, common bile duct 3 mm. Possible hemangioma in the anterior segment of the right lobe will require repeat ultrasound in one year for follow-up to confirm stability.  History includes ulcer, hypertension, GERD, depression, anal fissure/blood in stool, asthma arthritis. No prior abdominal surgical history Family history positive for ovarian cancer in her mother, hypertension, breast cancer, diabetes  Occasional alcohol denies tobacco or drug use.  The patient is a 42 year old  female.   Diagnostic Studies History [name], CMA; 03/22/2017 11:24 AM) Colonoscopy 5-10 years ago Mammogram never Pap Smear 1-5 years ago  Allergies [name], CMA; 03/22/2017 11:25 AM) Tylenol *ANALGESICS - NonNarcotic* Mucinex *COUGH/COLD/ALLERGY* Robitussin 12 Hour Cough *COUGH/COLD/ALLERGY* Sudafed *NASAL AGENTS - SYSTEMIC AND TOPICAL* Seafood Allergies Reconciled  Medication History [name], CMA; 03/22/2017 11:27 AM) [place] with Flavor Pack (420GM For Solution, Oral) Active. Terbinafine HCl (250MG  Tablet, Oral) Active. Gabapentin (Oral) Specific strength unknown - Active. EPINEPHrine (0.3MG /0.3ML Soln Auto-inj, Injection) Active. Budesonide (Inhalation) Specific strength unknown - Active. Flonase (50MCG/DOSE Inhaler, Nasal) Active. Prenatal Vitamin (Oral) Specific strength unknown - Active. Singulair (Oral) Specific strength unknown - Active. Medications Reconciled  Social History [name], CMA; 03/22/2017 11:24 AM) Alcohol use Moderate alcohol use. Caffeine use Coffee. No drug use Tobacco use Never smoker.  Family History [name], CMA; 03/22/2017 11:24 AM) Alcohol Abuse Father, Mother. Cancer Mother. Depression Mother. Migraine Headache Sister. Ovarian Cancer Mother. Seizure disorder Father.  Pregnancy / Birth History [name], [place]; 03/22/2017 11:24 AM) Contraceptive History Intrauterine device, Oral contraceptives. Gravida 4 Length (months) of breastfeeding 7-12 Maternal age 45-30 Para 3 Regular periods  Other Problems [name], CMA; 03/22/2017 11:24 AM) Anxiety Disorder Arthritis Asthma Back Pain Cholelithiasis Depression Gastric Ulcer Gastroesophageal Reflux Disease Hemorrhoids High blood pressure     Review of Systems [name] CMA; 03/22/2017 11:24 AM) General Present- Appetite Loss, Chills, Fatigue and Weight  Gain. Not Present- Fever, Night Sweats and Weight Loss. Skin Not Present- Change in Wart/Mole, Dryness, Hives, Jaundice, New Lesions, Non-Healing Wounds, Rash and Ulcer. HEENT Present- Seasonal Allergies, Sinus Pain and Sore Throat. Not Present- Earache, Hearing Loss, Hoarseness, Nose Bleed, Oral Ulcers, Ringing in the  Ears, Visual Disturbances, Wears glasses/contact lenses and Yellow Eyes. Respiratory Not Present- Bloody sputum, Chronic Cough, Difficulty Breathing, Snoring and Wheezing. Breast Not Present- Breast Mass, Breast Pain, Nipple Discharge and Skin Changes. Cardiovascular Not Present- Chest Pain, Difficulty Breathing Lying Down, Leg Cramps, Palpitations, Rapid Heart Rate, Shortness of Breath and Swelling of Extremities. Gastrointestinal Present- Abdominal Pain, Bloating, Change in Bowel Habits, Excessive gas, Gets full quickly at meals and Hemorrhoids. Not Present- Bloody Stool, Chronic diarrhea, Constipation, Difficulty Swallowing, Indigestion, Nausea, Rectal Pain and Vomiting. Female Genitourinary Not Present- Frequency, Nocturia, Painful Urination, Pelvic Pain and Urgency. Musculoskeletal Present- Back Pain, Joint Pain, Joint Stiffness and Muscle Weakness. Not Present- Muscle Pain and Swelling of Extremities. Neurological Present- Numbness and Tingling. Not Present- Decreased Memory, Fainting, Headaches, Seizures, Tremor, Trouble walking and Weakness. Psychiatric Present- Anxiety, Change in Sleep Pattern and Depression. Not Present- Bipolar, Fearful and Frequent crying. Endocrine Present- Hair Changes. Not Present- Cold Intolerance, Excessive Hunger, Heat Intolerance, Hot flashes and New Diabetes. Hematology Not Present- Blood Thinners, Easy Bruising, Excessive bleeding, Gland problems, HIV and Persistent Infections.  Vitals [name] CMA; 03/22/2017 11:28 AM) 03/22/2017 11:27 AM Weight: 212.38 lb Height: 65in Body Surface Area: 2.03 m Body Mass Index: 35.34 kg/m   Temp.: 98.46F(Oral)  Pulse: 85 (Regular)  BP: 122/84 (Sitting, Left Arm, Standard)      Physical Exam (Chelsea A. Kae Heller MD; 03/22/2017 11:48 AM)  General Note: alert and well appearing  Integumentary Note: warm and dry  Head and Neck Note: no mass or thyromegaly  Eye Note: No scleral icterus. Extra ocular motions intact.  ENMT Note: Moist mucous membranes, dentition intact  Chest and Lung Exam Note: Unlabored respirations, clear bilaterally  Cardiovascular Note: Regular rate and rhythm, no pedal edema  Abdomen Note: Soft, nontender nondistended. No mass or organomegaly.  Neurologic Note: Grossly intact, normal gait  Neuropsychiatric Note: Normal mood and affect. Appropriate insight.  Musculoskeletal Note: Strength symmetrical throughout, no deformity    Assessment & Plan (Chelsea A. Kae Heller MD; 03/22/2017 11:50 AM)  SYMPTOMATIC CHOLELITHIASIS (K80.20) Story: She has multiple vague GI symptoms. We discussed the anatomy and physiology of the gallbladder and the technical aspects of laparoscopic cholecystectomy. I discussed with her the risk of bleeding, infection, pain, scarring, intra-abdominal injury specifically to common bile duct and sequelae, conversion to open surgery, etc. I discussed with her that the nausea, decreased appetite and postprandial epigastric pain may resolve or at least improve after cholecystectomy, but that many of her lower GI symptoms are unlikely to change. I think it would be reasonable to proceed with laparoscopic cholecystectomy and if no resolution, proceed to upper endoscopy/upper GI. I also advised her that it would be reasonable to do the endoscopies first. She is interested in pursuing gall bladder surgery. All her questions were answered. We will get her scheduled.

## 2017-03-22 NOTE — H&P (View-Only) (Signed)
[name] Documented: 03/22/2017 11:24 AM Location: [facility] Patient #: [id number] DOB: 06/21/1975 Married / Language: English / Race: Black or African American Female  History of Present Illness ([name] A. Kae Heller MD; 03/22/2017 11:47 AM) Patient words: 42 year old woman who is followed by Dr. Ardis Hughs and [name] with [facility] gastroenterology where she has been previously evaluated for multiple GI issues including GERD, dysphagia, upper abdominal pain, bloating, early satiety and altered bowel habits. She has also been evaluated a [facility] for the same. At this point she is slated to undergo colonoscopy for recent history of rectal bleeding (now resolved) which is started on the time of the birth of her baby 8 months ago. She has had known gallstones that sound like she has been hesitant to pursue surgery. Her symptoms include epigastric pain after eating, occasionally feels like things get stuck here, bloating, loss of appetite and occasional nausea. She also describes inconsistency of her bowel habits, frequently with diarrhea but occasionally the other. When she first and she had gallstones she did a "detox "where she drank water with Epsom salts followed by extruding olive oil and lemon juice and she states that she could feel the gallstones passing in her stool which I have assured her was not the case. Ultrasound on February 26 shows gallstones with no signs of cholecystitis, common bile duct 3 mm. Possible hemangioma in the anterior segment of the right lobe will require repeat ultrasound in one year for follow-up to confirm stability.  History includes ulcer, hypertension, GERD, depression, anal fissure/blood in stool, asthma arthritis. No prior abdominal surgical history Family history positive for ovarian cancer in her mother, hypertension, breast cancer, diabetes  Occasional alcohol denies tobacco or drug use.  The patient is a 42 year old  female.   Diagnostic Studies History [name], CMA; 03/22/2017 11:24 AM) Colonoscopy 5-10 years ago Mammogram never Pap Smear 1-5 years ago  Allergies [name], CMA; 03/22/2017 11:25 AM) Tylenol *ANALGESICS - NonNarcotic* Mucinex *COUGH/COLD/ALLERGY* Robitussin 12 Hour Cough *COUGH/COLD/ALLERGY* Sudafed *NASAL AGENTS - SYSTEMIC AND TOPICAL* Seafood Allergies Reconciled  Medication History [name], CMA; 03/22/2017 11:27 AM) [place] with Flavor Pack (420GM For Solution, Oral) Active. Terbinafine HCl (250MG  Tablet, Oral) Active. Gabapentin (Oral) Specific strength unknown - Active. EPINEPHrine (0.3MG /0.3ML Soln Auto-inj, Injection) Active. Budesonide (Inhalation) Specific strength unknown - Active. Flonase (50MCG/DOSE Inhaler, Nasal) Active. Prenatal Vitamin (Oral) Specific strength unknown - Active. Singulair (Oral) Specific strength unknown - Active. Medications Reconciled  Social History [name], CMA; 03/22/2017 11:24 AM) Alcohol use Moderate alcohol use. Caffeine use Coffee. No drug use Tobacco use Never smoker.  Family History [name], CMA; 03/22/2017 11:24 AM) Alcohol Abuse Father, Mother. Cancer Mother. Depression Mother. Migraine Headache Sister. Ovarian Cancer Mother. Seizure disorder Father.  Pregnancy / Birth History [name], [place]; 03/22/2017 11:24 AM) Contraceptive History Intrauterine device, Oral contraceptives. Gravida 4 Length (months) of breastfeeding 7-12 Maternal age 13-30 Para 3 Regular periods  Other Problems [name], CMA; 03/22/2017 11:24 AM) Anxiety Disorder Arthritis Asthma Back Pain Cholelithiasis Depression Gastric Ulcer Gastroesophageal Reflux Disease Hemorrhoids High blood pressure     Review of Systems [name] CMA; 03/22/2017 11:24 AM) General Present- Appetite Loss, Chills, Fatigue and Weight  Gain. Not Present- Fever, Night Sweats and Weight Loss. Skin Not Present- Change in Wart/Mole, Dryness, Hives, Jaundice, New Lesions, Non-Healing Wounds, Rash and Ulcer. HEENT Present- Seasonal Allergies, Sinus Pain and Sore Throat. Not Present- Earache, Hearing Loss, Hoarseness, Nose Bleed, Oral Ulcers, Ringing in the  Ears, Visual Disturbances, Wears glasses/contact lenses and Yellow Eyes. Respiratory Not Present- Bloody sputum, Chronic Cough, Difficulty Breathing, Snoring and Wheezing. Breast Not Present- Breast Mass, Breast Pain, Nipple Discharge and Skin Changes. Cardiovascular Not Present- Chest Pain, Difficulty Breathing Lying Down, Leg Cramps, Palpitations, Rapid Heart Rate, Shortness of Breath and Swelling of Extremities. Gastrointestinal Present- Abdominal Pain, Bloating, Change in Bowel Habits, Excessive gas, Gets full quickly at meals and Hemorrhoids. Not Present- Bloody Stool, Chronic diarrhea, Constipation, Difficulty Swallowing, Indigestion, Nausea, Rectal Pain and Vomiting. Female Genitourinary Not Present- Frequency, Nocturia, Painful Urination, Pelvic Pain and Urgency. Musculoskeletal Present- Back Pain, Joint Pain, Joint Stiffness and Muscle Weakness. Not Present- Muscle Pain and Swelling of Extremities. Neurological Present- Numbness and Tingling. Not Present- Decreased Memory, Fainting, Headaches, Seizures, Tremor, Trouble walking and Weakness. Psychiatric Present- Anxiety, Change in Sleep Pattern and Depression. Not Present- Bipolar, Fearful and Frequent crying. Endocrine Present- Hair Changes. Not Present- Cold Intolerance, Excessive Hunger, Heat Intolerance, Hot flashes and New Diabetes. Hematology Not Present- Blood Thinners, Easy Bruising, Excessive bleeding, Gland problems, HIV and Persistent Infections.  Vitals [name] CMA; 03/22/2017 11:28 AM) 03/22/2017 11:27 AM Weight: 212.38 lb Height: 65in Body Surface Area: 2.03 m Body Mass Index: 35.34 kg/m   Temp.: 98.51F(Oral)  Pulse: 85 (Regular)  BP: 122/84 (Sitting, Left Arm, Standard)      Physical Exam (Dymin Dingledine A. Kae Heller MD; 03/22/2017 11:48 AM)  General Note: alert and well appearing  Integumentary Note: warm and dry  Head and Neck Note: no mass or thyromegaly  Eye Note: No scleral icterus. Extra ocular motions intact.  ENMT Note: Moist mucous membranes, dentition intact  Chest and Lung Exam Note: Unlabored respirations, clear bilaterally  Cardiovascular Note: Regular rate and rhythm, no pedal edema  Abdomen Note: Soft, nontender nondistended. No mass or organomegaly.  Neurologic Note: Grossly intact, normal gait  Neuropsychiatric Note: Normal mood and affect. Appropriate insight.  Musculoskeletal Note: Strength symmetrical throughout, no deformity    Assessment & Plan (Emmajane Altamura A. Kae Heller MD; 03/22/2017 11:50 AM)  SYMPTOMATIC CHOLELITHIASIS (K80.20) Story: She has multiple vague GI symptoms. We discussed the anatomy and physiology of the gallbladder and the technical aspects of laparoscopic cholecystectomy. I discussed with her the risk of bleeding, infection, pain, scarring, intra-abdominal injury specifically to common bile duct and sequelae, conversion to open surgery, etc. I discussed with her that the nausea, decreased appetite and postprandial epigastric pain may resolve or at least improve after cholecystectomy, but that many of her lower GI symptoms are unlikely to change. I think it would be reasonable to proceed with laparoscopic cholecystectomy and if no resolution, proceed to upper endoscopy/upper GI. I also advised her that it would be reasonable to do the endoscopies first. She is interested in pursuing gall bladder surgery. All her questions were answered. We will get her scheduled.

## 2017-04-05 ENCOUNTER — Telehealth: Payer: Self-pay | Admitting: Nurse Practitioner

## 2017-04-05 NOTE — Telephone Encounter (Signed)
Patient states she went to [facility] like she was told to do by Nevin Bloodgood and now she wants to schedule endo with already scheduled colon. Is this okay per Nevin Bloodgood? Please advise.

## 2017-04-05 NOTE — Telephone Encounter (Signed)
Left a message to call back. What did the surgeon suggest and the plan?

## 2017-04-11 NOTE — Telephone Encounter (Signed)
Patient is scheduled for a lap.cholecystectomy 04/21/17. Left a message on her voicemail to call back if she still wants to discuss her case.

## 2017-04-12 NOTE — Pre-Procedure Instructions (Signed)
MAJESTIC MOLONY  04/12/2017      [id number] - [place], [place] [address] Phone: [phone] Fax: [phone]    Your procedure is scheduled on Friday April 12.  Report to [facility] Admitting at 9:30 A.M.  Call this number if you have problems the morning of surgery:  [phone]   Remember:  Do not eat food or drink liquids after midnight.  Take these medicines the morning of surgery with A SIP OF WATER:   Terbinafine (Lamisil) Ranitidine (Zantac) Gabapentin (neurontin) if needed Symbicort Albuterol inhaler (please bring inhaler to hospital with you) Flonase if needed  7 days prior to surgery STOP taking any Aspirin(unless otherwise instructed by your surgeon), Aleve, Naproxen, Ibuprofen, Motrin, Advil, Goody's, BC's, all herbal medications, fish oil, and all vitamins    Do not wear jewelry, make-up or nail polish.  Do not wear lotions, powders, or perfumes, or deodorant.  Do not shave 48 hours prior to surgery.  Men may shave face and neck.  Do not bring valuables to the hospital.  [facility] is not responsible for any belongings or valuables.  Contacts, dentures or bridgework may not be worn into surgery.  Leave your suitcase in the car.  After surgery it may be brought to your room.  For patients admitted to the hospital, discharge time will be determined by your treatment team.  Patients discharged the day of surgery will not be allowed to drive home.   Special instructions:    [facility]- Preparing For Surgery  Before surgery, you can play an important role. Because skin is not sterile, your skin needs to be as free of germs as possible. You can reduce the number of germs on your skin by washing with CHG (chlorahexidine gluconate) Soap before surgery.  CHG is an antiseptic cleaner which kills germs and bonds with the skin to continue killing germs even after washing.  Please do not use if  you have an allergy to CHG or antibacterial soaps. If your skin becomes reddened/irritated stop using the CHG.  Do not shave (including legs and underarms) for at least 48 hours prior to first CHG shower. It is OK to shave your face.  Please follow these instructions carefully.   1. Shower the NIGHT BEFORE SURGERY and the MORNING OF SURGERY with CHG.   2. If you chose to wash your hair, wash your hair first as usual with your normal shampoo.  3. After you shampoo, rinse your hair and body thoroughly to remove the shampoo.  4. Use CHG as you would any other liquid soap. You can apply CHG directly to the skin and wash gently with a scrungie or a clean washcloth.   5. Apply the CHG Soap to your body ONLY FROM THE NECK DOWN.  Do not use on open wounds or open sores. Avoid contact with your eyes, ears, mouth and genitals (private parts). Wash Face and genitals (private parts)  with your normal soap.  6. Wash thoroughly, paying special attention to the area where your surgery will be performed.  7. Thoroughly rinse your body with warm water from the neck down.  8. DO NOT shower/wash with your normal soap after using and rinsing off the CHG Soap.  9. Pat yourself dry with a CLEAN TOWEL.  10. Wear CLEAN PAJAMAS to bed the night before surgery, wear comfortable clothes the morning of surgery  11. Place CLEAN SHEETS on your bed  the night of your first shower and DO NOT SLEEP WITH PETS.    Day of Surgery: Do not apply any deodorants/lotions. Please wear clean clothes to the hospital/surgery center.      Please read over the following fact sheets that you were given. Coughing and Deep Breathing and Surgical Site Infection Prevention

## 2017-04-13 ENCOUNTER — Encounter (HOSPITAL_COMMUNITY)
Admission: RE | Admit: 2017-04-13 | Discharge: 2017-04-13 | Disposition: A | Discharge status: Home / Self Care | Payer: BLUE CROSS/BLUE SHIELD | Source: Ambulatory Visit | Attending: Surgery | Admitting: Surgery

## 2017-04-13 ENCOUNTER — Other Ambulatory Visit: Payer: Self-pay

## 2017-04-13 ENCOUNTER — Encounter (HOSPITAL_COMMUNITY): Payer: Self-pay

## 2017-04-13 DIAGNOSIS — Z01812 Encounter for preprocedural laboratory examination: Secondary | ICD-10-CM | POA: Diagnosis not present

## 2017-04-13 HISTORY — DX: Pneumonia, unspecified organism: J18.9

## 2017-04-13 HISTORY — DX: Prediabetes: R73.03

## 2017-04-13 HISTORY — DX: Hemangioma of intra-abdominal structures: D18.03

## 2017-04-13 HISTORY — DX: Calculus of gallbladder without cholecystitis without obstruction: K80.20

## 2017-04-13 LAB — CBC WITH DIFFERENTIAL/PLATELET
BASOS PCT: 0 %
Basophils Absolute: 0 10*3/uL (ref 0.0–0.1)
EOS ABS: 0.1 10*3/uL (ref 0.0–0.7)
Eosinophils Relative: 1 %
HEMATOCRIT: 37.4 % (ref 36.0–46.0)
Hemoglobin: 12.2 g/dL (ref 12.0–15.0)
Lymphocytes Relative: 38 %
Lymphs Abs: 2.1 10*3/uL (ref 0.7–4.0)
MCH: 31 pg (ref 26.0–34.0)
MCHC: 32.6 g/dL (ref 30.0–36.0)
MCV: 95.2 fL (ref 78.0–100.0)
MONO ABS: 0.2 10*3/uL (ref 0.1–1.0)
MONOS PCT: 4 %
Neutro Abs: 3.1 10*3/uL (ref 1.7–7.7)
Neutrophils Relative %: 57 %
Platelets: 333 10*3/uL (ref 150–400)
RBC: 3.93 MIL/uL (ref 3.87–5.11)
RDW: 13.7 % (ref 11.5–15.5)
WBC: 5.5 10*3/uL (ref 4.0–10.5)

## 2017-04-13 LAB — COMPREHENSIVE METABOLIC PANEL
ALBUMIN: 4 g/dL (ref 3.5–5.0)
ALT: 40 U/L (ref 14–54)
ANION GAP: 9 (ref 5–15)
AST: 22 U/L (ref 15–41)
Alkaline Phosphatase: 64 U/L (ref 38–126)
BILIRUBIN TOTAL: 1.1 mg/dL (ref 0.3–1.2)
BUN: 5 mg/dL — ABNORMAL LOW (ref 6–20)
CO2: 24 mmol/L (ref 22–32)
Calcium: 9.3 mg/dL (ref 8.9–10.3)
Chloride: 105 mmol/L (ref 101–111)
Creatinine, Ser: 0.88 mg/dL (ref 0.44–1.00)
GFR calc non Af Amer: >60 mL/min (ref >60)
GLUCOSE: 101 mg/dL — AB (ref 65–99)
POTASSIUM: 4.2 mmol/L (ref 3.5–5.1)
Sodium: 138 mmol/L (ref 135–145)
TOTAL PROTEIN: 7.5 g/dL (ref 6.5–8.1)

## 2017-04-13 LAB — HEMOGLOBIN A1C
HEMOGLOBIN A1C: 5.6 % (ref 4.8–5.6)
MEAN PLASMA GLUCOSE: 114.02 mg/dL

## 2017-04-13 NOTE — Progress Notes (Signed)
Pt denies SOB, chest pain, and being under the care of a cardiologist. Pt denies having a stress test, echo and cardiac cath. Pt denies having a chest x ray within the last year. Pt stated that she is pre-diabetic and had her last A1c in November. Willeen Cass, NP, Anesthesia, advised that an A1c be drawn. Pt denies recent labs.

## 2017-04-13 NOTE — Pre-Procedure Instructions (Signed)
    [name]  04/13/2017      [id number] - [place], [place] [address] Phone: [phone] Fax: [phone]    Your procedure is scheduled on Friday, April 21, 2017  Report to [facility] Admitting at 9:30 A.M.  Call this number if you have problems the morning of surgery:  [phone]   Remember:  Do not eat food or drink liquids after midnight Thursday, April 20, 2017  Take these medicines the morning of surgery with A SIP OF WATER :  fluticasone (FLONASE)  nasal spray,   If needed: gabapentin (NEURONTIN) for pain,  albuterol (PROVENTIL HFA;VENTOLIN HFA) inhaler for wheezing or shortness of breath, budesonide-formoterol (SYMBICORT) for respiratory issues ( Bring both inhalers in with you on day of surgery). Stop taking Aspirin, vitamins, fish oil and herbal medications. Do not take any NSAIDs ie: Ibuprofen, Advil, Naproxen (Aleve), Motrin, diclofenac sodium (VOLTAREN) Gel,  BC and Goody Powder; stop Friday, April 14, 2017.  Do not wear jewelry, make-up or nail polish.  Do not wear lotions, powders, or perfumes, or deodorant.  Do not shave 48 hours prior to surgery.    Do not bring valuables to the hospital.  [facility] is not responsible for any belongings or valuables.  Contacts, dentures or bridgework may not be worn into surgery.  Leave your suitcase in the car.  After surgery it may be brought to your room. For patients admitted to the hospital, discharge time will be determined by your treatment team. Patients discharged the day of surgery will not be allowed to drive home.  Special instructions: Shower thew night before surgery and the morning of surgery with CHG Please read over the following fact sheets that you were given. Pain Booklet, Coughing and Deep Breathing and Surgical Site Infection Prevention

## 2017-04-21 ENCOUNTER — Ambulatory Visit (HOSPITAL_COMMUNITY)
Admission: RE | Admit: 2017-04-21 | Discharge: 2017-04-21 | Disposition: A | Discharge status: Home / Self Care | Payer: BLUE CROSS/BLUE SHIELD | Source: Ambulatory Visit | Attending: Surgery | Admitting: Surgery

## 2017-04-21 ENCOUNTER — Encounter (HOSPITAL_COMMUNITY): Payer: Self-pay | Admitting: *Deleted

## 2017-04-21 ENCOUNTER — Ambulatory Visit (HOSPITAL_COMMUNITY): Payer: BLUE CROSS/BLUE SHIELD | Admitting: Certified Registered"

## 2017-04-21 ENCOUNTER — Encounter (HOSPITAL_COMMUNITY)
Admission: RE | Disposition: A | Discharge status: Home / Self Care | Payer: Self-pay | Source: Ambulatory Visit | Attending: Surgery

## 2017-04-21 DIAGNOSIS — F329 Major depressive disorder, single episode, unspecified: Secondary | ICD-10-CM | POA: Diagnosis not present

## 2017-04-21 DIAGNOSIS — M1990 Unspecified osteoarthritis, unspecified site: Secondary | ICD-10-CM | POA: Diagnosis not present

## 2017-04-21 DIAGNOSIS — Z885 Allergy status to narcotic agent status: Secondary | ICD-10-CM | POA: Insufficient documentation

## 2017-04-21 DIAGNOSIS — Z888 Allergy status to other drugs, medicaments and biological substances status: Secondary | ICD-10-CM | POA: Insufficient documentation

## 2017-04-21 DIAGNOSIS — I10 Essential (primary) hypertension: Secondary | ICD-10-CM | POA: Insufficient documentation

## 2017-04-21 DIAGNOSIS — Z886 Allergy status to analgesic agent status: Secondary | ICD-10-CM | POA: Insufficient documentation

## 2017-04-21 DIAGNOSIS — K219 Gastro-esophageal reflux disease without esophagitis: Secondary | ICD-10-CM | POA: Diagnosis not present

## 2017-04-21 DIAGNOSIS — K8010 Calculus of gallbladder with chronic cholecystitis without obstruction: Secondary | ICD-10-CM | POA: Diagnosis not present

## 2017-04-21 DIAGNOSIS — F419 Anxiety disorder, unspecified: Secondary | ICD-10-CM | POA: Diagnosis not present

## 2017-04-21 DIAGNOSIS — Z91013 Allergy to seafood: Secondary | ICD-10-CM | POA: Insufficient documentation

## 2017-04-21 DIAGNOSIS — Z79899 Other long term (current) drug therapy: Secondary | ICD-10-CM | POA: Diagnosis not present

## 2017-04-21 DIAGNOSIS — J45909 Unspecified asthma, uncomplicated: Secondary | ICD-10-CM | POA: Diagnosis not present

## 2017-04-21 DIAGNOSIS — K8050 Calculus of bile duct without cholangitis or cholecystitis without obstruction: Secondary | ICD-10-CM | POA: Diagnosis not present

## 2017-04-21 DIAGNOSIS — Z8719 Personal history of other diseases of the digestive system: Secondary | ICD-10-CM | POA: Diagnosis not present

## 2017-04-21 HISTORY — PX: CHOLECYSTECTOMY: SHX55

## 2017-04-21 LAB — POCT PREGNANCY, URINE: Preg Test, Ur: NEGATIVE

## 2017-04-21 LAB — GLUCOSE, CAPILLARY: GLUCOSE-CAPILLARY: 98 mg/dL (ref 65–99)

## 2017-04-21 SURGERY — LAPAROSCOPIC CHOLECYSTECTOMY
Anesthesia: General

## 2017-04-21 MED ORDER — PROPOFOL 10 MG/ML IV BOLUS
INTRAVENOUS | Status: AC
  Filled 2017-04-21: qty 20

## 2017-04-21 MED ORDER — DEXAMETHASONE SODIUM PHOSPHATE 10 MG/ML IJ SOLN
INTRAMUSCULAR | Status: AC
  Filled 2017-04-21: qty 1

## 2017-04-21 MED ORDER — ONDANSETRON HCL 4 MG/2ML IJ SOLN
INTRAMUSCULAR | Status: AC
  Filled 2017-04-21: qty 2

## 2017-04-21 MED ORDER — KETOROLAC TROMETHAMINE 30 MG/ML IJ SOLN
INTRAMUSCULAR | Status: DC | PRN
  Administered 2017-04-21: 30 mg via INTRAVENOUS

## 2017-04-21 MED ORDER — FENTANYL CITRATE (PF) 250 MCG/5ML IJ SOLN
INTRAMUSCULAR | Status: AC
  Filled 2017-04-21: qty 5

## 2017-04-21 MED ORDER — ONDANSETRON HCL 4 MG/2ML IJ SOLN
INTRAMUSCULAR | Status: DC | PRN
  Administered 2017-04-21: 4 mg via INTRAVENOUS

## 2017-04-21 MED ORDER — PHENYLEPHRINE 40 MCG/ML (10ML) SYRINGE FOR IV PUSH (FOR BLOOD PRESSURE SUPPORT)
PREFILLED_SYRINGE | INTRAVENOUS | Status: AC
  Filled 2017-04-21: qty 20

## 2017-04-21 MED ORDER — ACETAMINOPHEN 325 MG PO TABS: 650.0000 mg | ORAL_TABLET | ORAL | Status: DC | PRN

## 2017-04-21 MED ORDER — CELECOXIB 200 MG PO CAPS: 200.0000 mg | ORAL_CAPSULE | ORAL | Status: DC

## 2017-04-21 MED ORDER — 0.9 % SODIUM CHLORIDE (POUR BTL) OPTIME
TOPICAL | Status: DC | PRN
  Administered 2017-04-21: 1000 mL

## 2017-04-21 MED ORDER — FENTANYL CITRATE (PF) 250 MCG/5ML IJ SOLN
INTRAMUSCULAR | Status: DC | PRN
  Administered 2017-04-21 (×3): 50 ug via INTRAVENOUS
  Administered 2017-04-21: 100 ug via INTRAVENOUS

## 2017-04-21 MED ORDER — OXYCODONE HCL 5 MG PO TABS
ORAL_TABLET | ORAL | Status: AC
  Filled 2017-04-21: qty 2

## 2017-04-21 MED ORDER — MIDAZOLAM HCL 2 MG/2ML IJ SOLN
INTRAMUSCULAR | Status: AC
  Filled 2017-04-21: qty 2

## 2017-04-21 MED ORDER — PHENYLEPHRINE 40 MCG/ML (10ML) SYRINGE FOR IV PUSH (FOR BLOOD PRESSURE SUPPORT)
PREFILLED_SYRINGE | INTRAVENOUS | Status: DC | PRN
  Administered 2017-04-21: 80 ug via INTRAVENOUS

## 2017-04-21 MED ORDER — LIDOCAINE 2% (20 MG/ML) 5 ML SYRINGE
INTRAMUSCULAR | Status: AC
  Filled 2017-04-21: qty 5

## 2017-04-21 MED ORDER — BUPIVACAINE-EPINEPHRINE 0.25% -1:200000 IJ SOLN
INTRAMUSCULAR | Status: DC | PRN
  Administered 2017-04-21: 10 mL

## 2017-04-21 MED ORDER — ONDANSETRON HCL 4 MG/2ML IJ SOLN
4.0000 mg | Freq: Once | INTRAMUSCULAR | Status: AC
Start: 2017-04-21 — End: 2017-04-21
  Administered 2017-04-21: 4 mg via INTRAVENOUS

## 2017-04-21 MED ORDER — PROPOFOL 10 MG/ML IV BOLUS
INTRAVENOUS | Status: DC | PRN
  Administered 2017-04-21: 160 mg via INTRAVENOUS

## 2017-04-21 MED ORDER — SUGAMMADEX SODIUM 200 MG/2ML IV SOLN
INTRAVENOUS | Status: AC
  Filled 2017-04-21: qty 2

## 2017-04-21 MED ORDER — OXYCODONE HCL 5 MG PO TABS
5.0000 mg | ORAL_TABLET | ORAL | Status: DC | PRN
  Administered 2017-04-21: 10 mg via ORAL

## 2017-04-21 MED ORDER — SODIUM CHLORIDE 0.9% FLUSH: 3.0000 mL | Freq: Two times a day (BID) | INTRAVENOUS | Status: DC

## 2017-04-21 MED ORDER — LIDOCAINE 2% (20 MG/ML) 5 ML SYRINGE
INTRAMUSCULAR | Status: DC | PRN
  Administered 2017-04-21: 60 mg via INTRAVENOUS

## 2017-04-21 MED ORDER — DOCUSATE SODIUM 100 MG PO CAPS: 100.0000 mg | ORAL_CAPSULE | Freq: Two times a day (BID) | ORAL | 0 refills | Status: AC

## 2017-04-21 MED ORDER — OXYCODONE HCL 5 MG PO TABS: 5.0000 mg | ORAL_TABLET | Freq: Four times a day (QID) | ORAL | 0 refills | Status: AC | PRN

## 2017-04-21 MED ORDER — BUPIVACAINE-EPINEPHRINE (PF) 0.25% -1:200000 IJ SOLN
INTRAMUSCULAR | Status: AC
  Filled 2017-04-21: qty 30

## 2017-04-21 MED ORDER — SODIUM CHLORIDE 0.9 % IR SOLN
Status: DC | PRN
  Administered 2017-04-21: 1000 mL

## 2017-04-21 MED ORDER — KETOROLAC TROMETHAMINE 30 MG/ML IJ SOLN
INTRAMUSCULAR | Status: AC
  Filled 2017-04-21: qty 1

## 2017-04-21 MED ORDER — ACETAMINOPHEN 650 MG RE SUPP: 650.0000 mg | RECTAL | Status: DC | PRN

## 2017-04-21 MED ORDER — FENTANYL CITRATE (PF) 100 MCG/2ML IJ SOLN: 25.0000 ug | INTRAMUSCULAR | Status: DC | PRN

## 2017-04-21 MED ORDER — CEFAZOLIN SODIUM-DEXTROSE 2-4 GM/100ML-% IV SOLN
2.0000 g | INTRAVENOUS | Status: AC
  Administered 2017-04-21: 2 g via INTRAVENOUS

## 2017-04-21 MED ORDER — CHLORHEXIDINE GLUCONATE 4 % EX LIQD: 60.0000 mL | Freq: Once | CUTANEOUS | Status: DC

## 2017-04-21 MED ORDER — CEFAZOLIN SODIUM-DEXTROSE 2-4 GM/100ML-% IV SOLN
INTRAVENOUS | Status: AC
  Filled 2017-04-21: qty 100

## 2017-04-21 MED ORDER — LACTATED RINGERS IV SOLN
INTRAVENOUS | Status: DC
  Administered 2017-04-21 (×2): via INTRAVENOUS

## 2017-04-21 MED ORDER — ROCURONIUM BROMIDE 10 MG/ML (PF) SYRINGE
PREFILLED_SYRINGE | INTRAVENOUS | Status: DC | PRN
  Administered 2017-04-21: 50 mg via INTRAVENOUS

## 2017-04-21 MED ORDER — SUGAMMADEX SODIUM 200 MG/2ML IV SOLN
INTRAVENOUS | Status: DC | PRN
  Administered 2017-04-21: 193.6 mg via INTRAVENOUS

## 2017-04-21 MED ORDER — GABAPENTIN 300 MG PO CAPS
ORAL_CAPSULE | ORAL | Status: AC
  Administered 2017-04-21: 300 mg
  Filled 2017-04-21: qty 1

## 2017-04-21 MED ORDER — FENTANYL CITRATE (PF) 100 MCG/2ML IJ SOLN
25.0000 ug | INTRAMUSCULAR | Status: DC | PRN
  Administered 2017-04-21: 50 ug via INTRAVENOUS

## 2017-04-21 MED ORDER — CELECOXIB 200 MG PO CAPS
ORAL_CAPSULE | ORAL | Status: AC
  Administered 2017-04-21: 200 mg
  Filled 2017-04-21: qty 1

## 2017-04-21 MED ORDER — MIDAZOLAM HCL 5 MG/5ML IJ SOLN
INTRAMUSCULAR | Status: DC | PRN
  Administered 2017-04-21: 2 mg via INTRAVENOUS

## 2017-04-21 MED ORDER — ROCURONIUM BROMIDE 10 MG/ML (PF) SYRINGE
PREFILLED_SYRINGE | INTRAVENOUS | Status: AC
  Filled 2017-04-21: qty 5

## 2017-04-21 MED ORDER — SODIUM CHLORIDE 0.9% FLUSH: 3.0000 mL | INTRAVENOUS | Status: DC | PRN

## 2017-04-21 MED ORDER — SODIUM CHLORIDE 0.9 % IV SOLN: 250.0000 mL | INTRAVENOUS | Status: DC | PRN

## 2017-04-21 MED ORDER — DEXAMETHASONE SODIUM PHOSPHATE 10 MG/ML IJ SOLN
INTRAMUSCULAR | Status: DC | PRN
  Administered 2017-04-21: 5 mg via INTRAVENOUS

## 2017-04-21 MED ORDER — GABAPENTIN 300 MG PO CAPS: 300.0000 mg | ORAL_CAPSULE | ORAL | Status: DC

## 2017-04-21 MED ORDER — FENTANYL CITRATE (PF) 100 MCG/2ML IJ SOLN
INTRAMUSCULAR | Status: AC
  Administered 2017-04-21: 50 ug via INTRAVENOUS
  Filled 2017-04-21: qty 2

## 2017-04-21 SURGICAL SUPPLY — 34 items
APPLIER CLIP 5 13 M/L LIGAMAX5 (MISCELLANEOUS) ×2
BLADE CLIPPER SURG (BLADE) IMPLANT
CANISTER SUCT 3000ML PPV (MISCELLANEOUS) ×2 IMPLANT
CHLORAPREP W/TINT 26ML (MISCELLANEOUS) ×2 IMPLANT
CLIP APPLIE 5 13 M/L LIGAMAX5 (MISCELLANEOUS) ×1 IMPLANT
COVER SURGICAL LIGHT HANDLE (MISCELLANEOUS) ×2 IMPLANT
DERMABOND ADVANCED (GAUZE/BANDAGES/DRESSINGS) ×1
DERMABOND ADVANCED .7 DNX12 (GAUZE/BANDAGES/DRESSINGS) ×1 IMPLANT
ELECT REM PT RETURN 9FT ADLT (ELECTROSURGICAL) ×2
ELECTRODE REM PT RTRN 9FT ADLT (ELECTROSURGICAL) ×1 IMPLANT
GLOVE BIO SURGEON STRL SZ 6 (GLOVE) ×2 IMPLANT
GLOVE BIOGEL PI IND STRL 6.5 (GLOVE) ×1 IMPLANT
GLOVE BIOGEL PI INDICATOR 6.5 (GLOVE) ×1
GOWN STRL REUS W/ TWL LRG LVL3 (GOWN DISPOSABLE) ×3 IMPLANT
GOWN STRL REUS W/TWL LRG LVL3 (GOWN DISPOSABLE) ×3
GRASPER SUT TROCAR 14GX15 (MISCELLANEOUS) ×2 IMPLANT
KIT BASIN OR (CUSTOM PROCEDURE TRAY) ×2 IMPLANT
KIT TURNOVER KIT B (KITS) ×2 IMPLANT
NEEDLE INSUFFLATION 14GA 120MM (NEEDLE) ×2 IMPLANT
NS IRRIG 1000ML POUR BTL (IV SOLUTION) ×2 IMPLANT
PAD ARMBOARD 7.5X6 YLW CONV (MISCELLANEOUS) ×2 IMPLANT
POUCH SPECIMEN RETRIEVAL 10MM (ENDOMECHANICALS) ×2 IMPLANT
SCISSORS LAP 5X35 DISP (ENDOMECHANICALS) ×2 IMPLANT
SET IRRIG TUBING LAPAROSCOPIC (IRRIGATION / IRRIGATOR) ×2 IMPLANT
SLEEVE ENDOPATH XCEL 5M (ENDOMECHANICALS) ×4 IMPLANT
SPECIMEN JAR SMALL (MISCELLANEOUS) ×2 IMPLANT
SUT MNCRL AB 4-0 PS2 18 (SUTURE) ×2 IMPLANT
TOWEL OR 17X24 6PK STRL BLUE (TOWEL DISPOSABLE) ×2 IMPLANT
TOWEL OR 17X26 10 PK STRL BLUE (TOWEL DISPOSABLE) IMPLANT
TRAY LAPAROSCOPIC MC (CUSTOM PROCEDURE TRAY) ×2 IMPLANT
TROCAR XCEL NON-BLD 11X100MML (ENDOMECHANICALS) ×2 IMPLANT
TROCAR XCEL NON-BLD 5MMX100MML (ENDOMECHANICALS) ×2 IMPLANT
TUBING INSUFFLATION (TUBING) ×2 IMPLANT
WATER STERILE IRR 1000ML POUR (IV SOLUTION) ×2 IMPLANT

## 2017-04-21 NOTE — Interval H&P Note (Signed)
History and Physical Interval Note:  04/21/2017 10:18 AM  [name]  has presented today for surgery, with the diagnosis of SYMPTOMATIC CHOLELITHIASIS  The various methods of treatment have been discussed with the patient and family. After consideration of risks, benefits and other options for treatment, the patient has consented to  Procedure(s): LAPAROSCOPIC CHOLECYSTECTOMY (N/A) as a surgical intervention .  The patient's history has been reviewed, patient examined, no change in status, stable for surgery.  I have reviewed the patient's chart and labs.  Questions were answered to the patient's satisfaction.     Levette Paulick Rich Brave

## 2017-04-21 NOTE — Op Note (Signed)
Operative Note  [name] 42 y.o. female [id number]  04/21/2017  Surgeon: Clovis Riley MD  Assistant: OR staff  Procedure performed: Laparoscopic Cholecystectomy  Preop diagnosis: biliary colic Post-op diagnosis/intraop findings: same  Specimens: gallbladder  EBL: minimal  Complications: none  Description of procedure: After obtaining informed consent the patient was brought to the operating room. Prophylactic antibiotics were administered. SCD's were applied. General endotracheal anesthesia was initiated and a formal time-out was performed. The abdomen was prepped and draped in the usual sterile fashion and the abdomen was entered using an infraumbilical veress needle after instilling the site with local. Insufflation to 41mmHg was obtained and gross inspection revealed no evidence of injury from our entry or other intraabdominal abnormalities. Two 11mm trocars were introduced in the right midclavicular and right anterior axillary lines under direct visualization and following infiltration with local. An 10mm trocar was placed in the epigastrium. The gallbladder was retracted cephalad and the infundibulum was retracted laterally. It was distended with stones and encased in omental adhesions. These were carefully taken down with cautery and blunt dissection ensuring no injury to adjacent viscera. A combination of hook electrocautery and blunt dissection was then utilized to clear the peritoneum from the neck and cystic duct, circumferentially isolating the cystic artery and cystic duct and lifting the gallbladder from the cystic plate. The critical view of safety was achieved with the cystic artery, cystic duct, and liver bed visualized between them with no other structures. The artery was clipped with a single clip proximally and divided distally with cautery. The cystic duct was ligated with three clips on the proximal end and divided. The gallbladder was dissected from the liver plate  using electrocautery. Once freed the gallbladder was placed in an endocatch bag and removed through the epigastric trocar site. The gallbladder tore within the bag during extraction. A small amount of bleeding on the liver bed was controlled with cautery. The right upper quadrant was irrigated and the effluent was clear. Hemostasis was once again confirmed, and reinspection of the abdomen revealed no injuries. The clips were well opposed without any bile leak from the duct or the liver bed. The 36mm trocar site in the epigastrium was closed with two 0 vicryls in the fascia under direct visualization using a PMI device. The abdomen was desufflated and all trocars removed. The skin incisions were closed with running subcuticular monocryl and Dermabond. The patient was awakened, extubated and transported to the recovery room in stable condition.   All counts were correct at the completion of the case.

## 2017-04-21 NOTE — Anesthesia Procedure Notes (Signed)
Procedure Name: Intubation Date/Time: 04/21/2017 10:58 AM Performed by: Freddie Breech, CRNA Pre-anesthesia Checklist: Patient identified, Emergency Drugs available, Suction available and Patient being monitored Patient Re-evaluated:Patient Re-evaluated prior to induction Oxygen Delivery Method: Circle System Utilized Preoxygenation: Pre-oxygenation with 100% oxygen Induction Type: IV induction Ventilation: Mask ventilation without difficulty Laryngoscope Size: Mac and 3 Grade View: Grade I Tube type: Oral Number of attempts: 1 Airway Equipment and Method: Stylet and Oral airway Placement Confirmation: ETT inserted through vocal cords under direct vision,  positive ETCO2 and breath sounds checked- equal and bilateral Secured at: 21 cm Tube secured with: Tape Dental Injury: Teeth and Oropharynx as per pre-operative assessment

## 2017-04-21 NOTE — Discharge Instructions (Signed)
LAPAROSCOPIC SURGERY: POST OP INSTRUCTIONS  ######################################################################  EAT Gradually transition to a high fiber diet with a fiber supplement over the next few weeks after discharge.  Start with a pureed / full liquid diet (see below)  WALK Walk an hour a day.  Control your pain to do that.    CONTROL PAIN Control pain so that you can walk, sleep, tolerate sneezing/coughing, go up/down stairs.  HAVE A BOWEL MOVEMENT DAILY Keep your bowels regular to avoid problems.  OK to try a laxative to override constipation.  OK to use an antidairrheal to slow down diarrhea.  Call if not better after 2 tries  CALL IF YOU HAVE PROBLEMS/CONCERNS Call if you are still struggling despite following these instructions. Call if you have concerns not answered by these instructions  ######################################################################    1. DIET: Follow a light bland diet the first 24 hours after arrival home, such as soup, liquids, crackers, etc.  Be sure to include lots of fluids daily.  Avoid fast food or heavy meals as your are more likely to get nauseated.  Eat a low fat the next few days after surgery.   2. Take your usually prescribed home medications unless otherwise directed. 3. PAIN CONTROL: a. Pain is best controlled by a usual combination of three different methods TOGETHER: i. Ice/Heat ii. Over the counter pain medication iii. Prescription pain medication b. Most patients will experience some swelling and bruising around the incisions.  Ice packs or heating pads (30-60 minutes up to 6 times a day) will help. Use ice for the first few days to help decrease swelling and bruising, then switch to heat to help relax tight/sore spots and speed recovery.  Some people prefer to use ice alone, heat alone, alternating between ice & heat.  Experiment to what works for you.  Swelling and bruising can take several weeks to resolve.   c. It is  helpful to take an over-the-counter pain medication regularly for the first few weeks.  Choose one of the following that works best for you: i. Naproxen (Aleve, etc)  Two 251m tabs twice a day ii. Ibuprofen (Advil, etc) Three 2053mtabs four times a day (every meal & bedtime) iii. Acetaminophen (Tylenol, etc) 500-65044mour times a day (every meal & bedtime) d. A  prescription for pain medication (such as oxycodone, hydrocodone, etc) should be given to you upon discharge.  Take your pain medication as prescribed.  i. If you are having problems/concerns with the prescription medicine (does not control pain, nausea, vomiting, rash, itching, etc), please call us [phone] see if we need to switch you to a different pain medicine that will work better for you and/or control your side effect better. ii. If you need a refill on your pain medication, please contact your pharmacy.  They will contact our office to request authorization. Prescriptions will not be filled after 5 pm or on week-ends. 4. Avoid getting constipated.  Between the surgery and the pain medications, it is common to experience some constipation.  Increasing fluid intake and taking a fiber supplement (such as Metamucil, Citrucel, FiberCon, MiraLax, etc) 1-2 times a day regularly will usually help prevent this problem from occurring.  A mild laxative (prune juice, Milk of Magnesia, MiraLax, etc) should be taken according to package directions if there are no bowel movements after 48 hours.   5. Watch out for diarrhea.  If you have many loose bowel movements, simplify your diet to bland foods & liquids for  a few days.  Stop any stool softeners and decrease your fiber supplement.  Switching to mild anti-diarrheal medications (Kayopectate, Pepto Bismol) can help.  If this worsens or does not improve, please call us. 6. Wash / shower every day.  You may shower over the skin glue which is waterproof.  No rubbing, scrubbing, lotions or  ointments to incisions. 7. Skin glue will flake off after about 2 weeks.  You may leave the incision open to air.  You may replace a dressing/Band-Aid to cover the incision for comfort if you wish.  8. ACTIVITIES as tolerated:   a. You may resume regular (light) daily activities beginning the next day--such as daily self-care, walking, climbing stairs--gradually increasing activities as tolerated.  If you can walk 30 minutes without difficulty, it is safe to try more intense activity such as jogging, treadmill, bicycling, low-impact aerobics, swimming, etc. b. Save the most intensive and strenuous activity for last such as sit-ups, heavy lifting, contact sports, etc  Refrain from any heavy lifting or straining until you are off narcotics for pain control.   c. DO NOT PUSH THROUGH PAIN.  Let pain be your guide: If it hurts to do something, don't do it.  Pain is your body warning you to avoid that activity for another week until the pain goes down. d. You may drive when you are no longer taking prescription pain medication, you can comfortably wear a seatbelt, and you can safely maneuver your car and apply brakes. e. Dennis Bast may have sexual intercourse when it is comfortable.  9. FOLLOW UP in our office a. Please call [facility] at [phone] to set up an appointment to see your surgeon in the office for a follow-up appointment approximately 2-3 weeks after your surgery. b. Make sure that you call for this appointment the day you arrive home to insure a convenient appointment time. 10. IF YOU HAVE DISABILITY OR FAMILY LEAVE FORMS, BRING THEM TO THE OFFICE FOR PROCESSING.  DO NOT GIVE THEM TO YOUR DOCTOR.   WHEN TO CALL us [phone]: 1. Poor pain control 2. Reactions / problems with new medications (rash/itching, nausea, etc)  3. Fever over 101.5 F (38.5 C) 4. Inability to urinate 5. Nausea and/or vomiting 6. Worsening swelling or bruising 7. Continued bleeding from incision. 8. Increased pain,  redness, or drainage from the incision   The clinic staff is available to answer your questions during regular business hours (8:30am-5pm).  Please dont hesitate to call and ask to speak to one of our nurses for clinical concerns.   If you have a medical emergency, go to the nearest emergency room or call 911.  A surgeon from [facility] is always on call at the [facility], [place], [place], [place], [place]  [address] ? MAIN: [phone] ? TOLL FREE: [phone] ?  FAX [phone] [email or web address]

## 2017-04-21 NOTE — Anesthesia Postprocedure Evaluation (Signed)
Anesthesia Post Note  Patient: [name]  Procedure(s) Performed: LAPAROSCOPIC CHOLECYSTECTOMY (N/A )     Patient location during evaluation: PACU Anesthesia Type: General Level of consciousness: awake and alert Pain management: pain level controlled Vital Signs Assessment: post-procedure vital signs reviewed and stable Respiratory status: spontaneous breathing, nonlabored ventilation, respiratory function stable and patient connected to nasal cannula oxygen Cardiovascular status: blood pressure returned to baseline and stable Postop Assessment: no apparent nausea or vomiting Anesthetic complications: no    Last Vitals:  Vitals:   04/21/17 1415 04/21/17 1445  BP: 110/72 (!) 112/48  Pulse: 73 65  Resp: 19 18  Temp:    SpO2: 100% 98%    Last Pain:  Vitals:   04/21/17 1440  TempSrc:   PainSc: 8                  Verne [place] EDWARD

## 2017-04-21 NOTE — Anesthesia Preprocedure Evaluation (Signed)
Anesthesia Evaluation  Patient identified by MRN, date of birth, ID band Patient awake    Reviewed: Allergy & Precautions, H&P , Patient's Chart, lab work & pertinent test results, reviewed documented beta blocker date and time   Airway Mallampati: II  TM Distance: >3 FB Neck ROM: full    Dental no notable dental hx.    Pulmonary    Pulmonary exam normal breath sounds clear to auscultation       Cardiovascular hypertension,  Rhythm:regular Rate:Normal     Neuro/Psych    GI/Hepatic   Endo/Other    Renal/GU      Musculoskeletal   Abdominal   Peds  Hematology   Anesthesia Other Findings   Reproductive/Obstetrics                             Anesthesia Physical Anesthesia Plan  ASA: II  Anesthesia Plan: General   Post-op Pain Management:    Induction: Intravenous  PONV Risk Score and Plan: 2 and Treatment may vary due to age or medical condition, Dexamethasone and Ondansetron  Airway Management Planned: Oral ETT  Additional Equipment:   Intra-op Plan:   Post-operative Plan: Extubation in OR  Informed Consent: I have reviewed the patients History and Physical, chart, labs and discussed the procedure including the risks, benefits and alternatives for the proposed anesthesia with the patient or authorized representative who has indicated his/her understanding and acceptance.   Dental Advisory Given  Plan Discussed with: CRNA and Surgeon  Anesthesia Plan Comments: (  )        Anesthesia Quick Evaluation

## 2017-04-21 NOTE — Transfer of Care (Signed)
Immediate Anesthesia Transfer of Care Note  Patient: [name]  Procedure(s) Performed: LAPAROSCOPIC CHOLECYSTECTOMY (N/A )  Patient Location: PACU  Anesthesia Type:General  Level of Consciousness: drowsy and patient cooperative  Airway & Oxygen Therapy: Patient Spontanous Breathing and Patient connected to face mask oxygen  Post-op Assessment: Report given to RN, Post -op Vital signs reviewed and stable and Patient moving all extremities X 4  Post vital signs: Reviewed and stable  Last Vitals:  Vitals Value Taken Time  BP 107/62 04/21/2017 12:00 PM  Temp    Pulse 70 04/21/2017 12:06 PM  Resp 10 04/21/2017 12:06 PM  SpO2 100 % 04/21/2017 12:06 PM  Vitals shown include unvalidated device data.  Last Pain:  Vitals:   04/21/17 1034  TempSrc:   PainSc: 5       Patients Stated Pain Goal: 8 (32/67/12 4580)  Complications: No apparent anesthesia complications

## 2017-04-22 ENCOUNTER — Encounter (HOSPITAL_COMMUNITY): Payer: Self-pay | Admitting: Surgery

## 2017-04-24 NOTE — Telephone Encounter (Signed)
Spoke with the patient. She had lap chole on 04/21/17. She is still recovering but feels she is progressing as expected. She is asking if she should postpone the colonoscopy in order to give herself time to see if she needs the EGD also. As of the weekend she continues to have early satiety and bloating.

## 2017-04-24 NOTE — Telephone Encounter (Signed)
Patient calling back stating she still wants endo scheduled with colon on 4.30.19 and would like to speak to nurse to discuss.

## 2017-04-25 ENCOUNTER — Encounter: Payer: Self-pay | Admitting: Gastroenterology

## 2017-04-25 NOTE — Telephone Encounter (Signed)
Beth, I doubt we could add an EGD on to colon even if felt necessary since it is less than two weeks away. Ask her to just keep colonoscopy appt, need to get that done. If other sx come back then we can arrange for eGD at a different time. Thanks

## 2017-04-25 NOTE — Telephone Encounter (Signed)
I left her a message with Paula's recommendations.  She will call with any questions.

## 2017-05-02 DIAGNOSIS — J3081 Allergic rhinitis due to animal (cat) (dog) hair and dander: Secondary | ICD-10-CM | POA: Diagnosis not present

## 2017-05-02 DIAGNOSIS — J4540 Moderate persistent asthma, uncomplicated: Secondary | ICD-10-CM | POA: Diagnosis not present

## 2017-05-02 DIAGNOSIS — J3089 Other allergic rhinitis: Secondary | ICD-10-CM | POA: Diagnosis not present

## 2017-05-02 DIAGNOSIS — J301 Allergic rhinitis due to pollen: Secondary | ICD-10-CM | POA: Diagnosis not present

## 2017-05-09 ENCOUNTER — Other Ambulatory Visit: Payer: Self-pay

## 2017-05-09 ENCOUNTER — Ambulatory Visit (AMBULATORY_SURGERY_CENTER): Payer: BLUE CROSS/BLUE SHIELD | Admitting: Gastroenterology

## 2017-05-09 ENCOUNTER — Encounter: Payer: Self-pay | Admitting: Gastroenterology

## 2017-05-09 ENCOUNTER — Telehealth: Payer: Self-pay

## 2017-05-09 VITALS — BP 116/72 | HR 97 | Temp 99.1°F | Resp 11 | Ht 65.25 in | Wt 211.0 lb

## 2017-05-09 DIAGNOSIS — R109 Unspecified abdominal pain: Secondary | ICD-10-CM | POA: Diagnosis not present

## 2017-05-09 DIAGNOSIS — Z79899 Other long term (current) drug therapy: Secondary | ICD-10-CM

## 2017-05-09 DIAGNOSIS — K625 Hemorrhage of anus and rectum: Secondary | ICD-10-CM

## 2017-05-09 DIAGNOSIS — K648 Other hemorrhoids: Secondary | ICD-10-CM

## 2017-05-09 MED ORDER — SODIUM CHLORIDE 0.9 % IV SOLN: 500.0000 mL | Freq: Once | INTRAVENOUS | Status: DC

## 2017-05-09 NOTE — Op Note (Addendum)
[facility] Patient Name: [name] Procedure Date: 05/09/2017 2:12 PM MRN: [id number] Endoscopist: Milus Banister , MD Age: 42 Referring MD:  Date of Birth: 1975/08/03 Gender: Female Account #: [id number] Procedure:                Colonoscopy Indications:              Rectal bleeding Medicines:                Monitored Anesthesia Care Procedure:                Pre-Anesthesia Assessment:                           - Prior to the procedure, a History and Physical                            was performed, and patient medications and                            allergies were reviewed. The patient's tolerance of                            previous anesthesia was also reviewed. The risks                            and benefits of the procedure and the sedation                            options and risks were discussed with the patient.                            All questions were answered, and informed consent                            was obtained. Prior Anticoagulants: The patient has                            taken no previous anticoagulant or antiplatelet                            agents. ASA Grade Assessment: II - A patient with                            mild systemic disease. After reviewing the risks                            and benefits, the patient was deemed in                            satisfactory condition to undergo the procedure.                           After obtaining informed consent, the colonoscope  was passed under direct vision. Throughout the                            procedure, the patient's blood pressure, pulse, and                            oxygen saturations were monitored continuously. The                            Colonoscope was introduced through the anus and                            advanced to the the cecum, identified by                            appendiceal orifice and ileocecal valve. The                     colonoscopy was performed without difficulty. The                            patient tolerated the procedure well. The quality                            of the bowel preparation was good. The ileocecal                            valve, appendiceal orifice, and rectum were                            photographed. Scope In: 2:24:20 PM Scope Out: 2:33:04 PM Scope Withdrawal Time: 0 hours 6 minutes 17 seconds  Total Procedure Duration: 0 hours 8 minutes 44 seconds  Findings:                 Internal hemorrhoids were found. The hemorrhoids                            were small.                           The exam was otherwise without abnormality on                            direct and retroflexion views. Complications:            No immediate complications. Estimated blood loss:                            None. Estimated Blood Loss:     Estimated blood loss: none. Impression:               - Internal hemorrhoids.                           - The examination was otherwise normal on direct  and retroflexion views.                           - No specimens collected. Recommendation:           - Patient has a contact number available for                            emergencies. The signs and symptoms of potential                            delayed complications were discussed with the                            patient. Return to normal activities tomorrow.                            Written discharge instructions were provided to the                            patient.                           - Resume previous diet.                           - Continue present medications. You are using your                            inhalers INCORRECTLY. Your abulterol is the                            'rescue' inhaler and your symbicort is for                            maintainence.                           - My office will arrange referral to a                             pulmonologist to discuss your asthma treatment                            further.                           - Repeat colonoscopy in 10 years for screening                            purposes. Milus Banister, MD 05/09/2017 2:39:22 PM This report has been signed electronically.

## 2017-05-09 NOTE — Progress Notes (Signed)
Pt's states no medical or surgical changes since previsit or office visit. 

## 2017-05-09 NOTE — Patient Instructions (Signed)
YOU HAD AN ENDOSCOPIC PROCEDURE TODAY AT [facility]:   Refer to the procedure report that was given to you for any specific questions about what was found during the examination.  If the procedure report does not answer your questions, please call your gastroenterologist to clarify.  If you requested that your care partner not be given the details of your procedure findings, then the procedure report has been included in a sealed envelope for you to review at your convenience later.  YOU SHOULD EXPECT: Some feelings of bloating in the abdomen. Passage of more gas than usual.  Walking can help get rid of the air that was put into your GI tract during the procedure and reduce the bloating. If you had a lower endoscopy (such as a colonoscopy or flexible sigmoidoscopy) you may notice spotting of blood in your stool or on the toilet paper. If you underwent a bowel prep for your procedure, you may not have a normal bowel movement for a few days.  Please Note:  You might notice some irritation and congestion in your nose or some drainage.  This is from the oxygen used during your procedure.  There is no need for concern and it should clear up in a day or so.  SYMPTOMS TO REPORT IMMEDIATELY:   Following lower endoscopy (colonoscopy or flexible sigmoidoscopy):  Excessive amounts of blood in the stool  Significant tenderness or worsening of abdominal pains  Swelling of the abdomen that is new, acute  Fever of 100F or higher  Please see handout given to you on Hemorrhoids.  You Are using your inhalers incorrectly. The Albuterol is the "rescue" and your symbicort is for maintenance.  Our office will call you with the referral to Pulmonology.  For urgent or emergent issues, a gastroenterologist can be reached at any hour by calling [phone].  DIET:  We do recommend a small meal at first, but then you may proceed to your regular diet.  Drink plenty of fluids but you should avoid  alcoholic beverages for 24 hours.  ACTIVITY:  You should plan to take it easy for the rest of today and you should NOT DRIVE or use heavy machinery until tomorrow (because of the sedation medicines used during the test).    FOLLOW UP: Our staff will call the number listed on your records the next business day following your procedure to check on you and address any questions or concerns that you may have regarding the information given to you following your procedure. If we do not reach you, we will leave a message.  However, if you are feeling well and you are not experiencing any problems, there is no need to return our call.  We will assume that you have returned to your regular daily activities without incident.  If any biopsies were taken you will be contacted by phone or by letter within the next 1-3 weeks.  Please call us at [phone] if you have not heard about the biopsies in 3 weeks.    SIGNATURES/CONFIDENTIALITY: You and/or your care partner have signed paperwork which will be entered into your electronic medical record.  These signatures attest to the fact that that the information above on your After Visit Summary has been reviewed and is understood.  Full responsibility of the confidentiality of this discharge information lies with you and/or your care-partner.  Thank you for letting us take care of your healthcare needs today.

## 2017-05-09 NOTE — Telephone Encounter (Signed)
My office will arrange referral to a  pulmonologist to discuss your asthma treatment further.    Referral made

## 2017-05-09 NOTE — Progress Notes (Signed)
Report given to PACU, vss 

## 2017-05-10 ENCOUNTER — Telehealth: Payer: Self-pay

## 2017-05-10 NOTE — Telephone Encounter (Signed)
  Follow up Call-  Call Jasher Barkan number 05/09/2017  Post procedure Call Tavish Gettis phone  # [phone]  Permission to leave phone message Yes  Some recent data might be hidden     Patient questions:  Do you have a fever, pain , or abdominal swelling? No. Pain Score  0 *  Have you tolerated food without any problems? Yes.    Have you been able to return to your normal activities? Yes.    Do you have any questions about your discharge instructions: Diet   No. Medications  No. Follow up visit  No.  Do you have questions or concerns about your Care? Yes.    Actions: * If pain score is 4 or above: No action needed, pain <4.   Pt states her nose was stuffy yesterday after her procedure, and she coughed on and off throughout the night. I explained stuffiness could be caused from oxygen that was applied during procedure. If symptoms worsen or do not get better better, I instructed her to follow up with primary care doctor. Patient verbalized understanding

## 2017-07-26 ENCOUNTER — Institutional Professional Consult (permissible substitution): Payer: BLUE CROSS/BLUE SHIELD | Admitting: Internal Medicine

## 2017-08-09 DIAGNOSIS — Z124 Encounter for screening for malignant neoplasm of cervix: Secondary | ICD-10-CM | POA: Diagnosis not present

## 2017-08-09 DIAGNOSIS — Z130 Encounter for screening for diseases of the blood and blood-forming organs and certain disorders involving the immune mechanism: Secondary | ICD-10-CM | POA: Diagnosis not present

## 2017-08-09 DIAGNOSIS — Z01419 Encounter for gynecological examination (general) (routine) without abnormal findings: Secondary | ICD-10-CM | POA: Diagnosis not present

## 2017-08-09 DIAGNOSIS — Z1231 Encounter for screening mammogram for malignant neoplasm of breast: Secondary | ICD-10-CM | POA: Diagnosis not present

## 2017-08-09 DIAGNOSIS — Z1389 Encounter for screening for other disorder: Secondary | ICD-10-CM | POA: Diagnosis not present

## 2017-08-29 ENCOUNTER — Institutional Professional Consult (permissible substitution): Payer: BLUE CROSS/BLUE SHIELD | Admitting: Internal Medicine

## 2017-09-25 ENCOUNTER — Ambulatory Visit (INDEPENDENT_AMBULATORY_CARE_PROVIDER_SITE_OTHER): Payer: BLUE CROSS/BLUE SHIELD | Admitting: Internal Medicine

## 2017-09-25 ENCOUNTER — Encounter: Payer: Self-pay | Admitting: Internal Medicine

## 2017-09-25 VITALS — BP 134/72 | HR 83 | Ht 65.0 in | Wt 216.4 lb

## 2017-09-25 DIAGNOSIS — J45991 Cough variant asthma: Secondary | ICD-10-CM | POA: Diagnosis not present

## 2017-09-25 LAB — NITRIC OXIDE: Nitric Oxide: <5

## 2017-09-25 MED ORDER — MONTELUKAST SODIUM 10 MG PO TABS: 10.0000 mg | ORAL_TABLET | Freq: Every day | ORAL | 2 refills | Status: AC

## 2017-09-25 MED ORDER — RANITIDINE HCL 300 MG PO TABS: 300.0000 mg | ORAL_TABLET | Freq: Every day | ORAL | 2 refills | Status: DC

## 2017-09-25 MED ORDER — BUDESONIDE-FORMOTEROL FUMARATE 80-4.5 MCG/ACT IN AERO: 2.0000 | INHALATION_SPRAY | Freq: Two times a day (BID) | RESPIRATORY_TRACT | 0 refills | Status: DC

## 2017-09-25 MED ORDER — PANTOPRAZOLE SODIUM 40 MG PO TBEC: 40.0000 mg | DELAYED_RELEASE_TABLET | Freq: Every day | ORAL | 2 refills | Status: DC

## 2017-09-25 NOTE — Patient Instructions (Addendum)
Plan A = Automatic = singulair 10 mg each pm along with zantac 150 mg whether you think you need it or not and add Protonix 40 mg  Take 30-60 min before first meal of the day   Plan B = Backup For cough or wheeze take symbicort 80 up to 2 pffs every 12 hours as needed    Work on inhaler technique:  relax and gently blow all the way out then take a nice smooth deep breath back in, triggering the inhaler at same time you start breathing in.  Hold for up to 5 seconds if you can. Blow out thru nose. Rinse and gargle with water when done       Plan C = Crisis - only use your albuterol inhaler if you use the symbicort first and it fails to control the cough - ok to use up to every 4 hours if needed    For itching / sneezing/ runny nose take zyrtec 10 mg one daily as needed    Please schedule a follow up office visit in 4 weeks, sooner if needed

## 2017-09-25 NOTE — Progress Notes (Signed)
Subjective:    Patient ID: [name], female    DOB: January 07, 1976, 42 y.o.   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 Consult    Referred by [facility] for asthma.    not taking either inhaler presently, uses albuterol prn cough but not very effective  If not coughing no sob, up and down steps Worse at noct p sev hours ok assoc with pnds no better with zyrtec and did not use singulair as maint rx  Also has reflux despite zantac hs prn   No obvious day to day or daytime variability or assoc excess/ purulent sputum or mucus plugs or hemoptysis or cp or chest tightness, subjective wheeze or overt sinus or hb symptoms.    Also denies any obvious fluctuation of symptoms with weather or environmental changes or other aggravating or alleviating factors except as outlined above   No unusual exposure hx or h/o childhood pna/ asthma or knowledge of premature birth.  Current Allergies, Complete Past Medical History, Past Surgical History, Family History, and Social History were reviewed in [employer] record.     Current Meds  Medication Sig  . albuterol (PROVENTIL HFA;VENTOLIN HFA) 108 (90 Base) MCG/ACT inhaler Inhale 1-2 puffs into the lungs every 6 (six) hours as needed for wheezing or shortness of breath.  . B Complex-C (SUPER B COMPLEX PO) Take 1 capsule by mouth daily.  . budesonide-formoterol (SYMBICORT) 80-4.5  MCG/ACT inhaler Inhale 2 puffs into the lungs 2 (two) times daily as needed (for respiratory issues.).   Marland Kitchen diclofenac sodium (VOLTAREN) 1 % GEL Apply 2-4 g topically 4 (four) times daily as needed (for pain.).   Marland Kitchen fluticasone (FLONASE) 50 MCG/ACT nasal spray Place 1 spray into both nostrils daily as needed.   . gabapentin (NEURONTIN) 600 MG tablet Take 600 mg by mouth 2 (two) times daily as needed (for pain.).   Marland Kitchen ranitidine (ZANTAC) 150 MG tablet Take 1 tablet (150 mg total) by mouth every morning. Take  30 minutes before breakfast  . terbinafine (LAMISIL) 250 MG tablet Take 250 mg by mouth daily.  Marland Kitchen triamcinolone ointment (KENALOG) 0.1 % Apply 1 application topically daily.         Review of Systems  Constitutional: Negative for fever and unexpected weight change.  HENT: Positive for congestion. Negative for dental problem, ear pain, nosebleeds, postnasal drip, rhinorrhea, sinus pressure, sneezing, sore throat and trouble swallowing.   Eyes: Negative for redness and itching.  Respiratory: Positive for cough, shortness of breath and wheezing. Negative for chest tightness.   Cardiovascular: Negative for palpitations and leg swelling.  Gastrointestinal: Negative for nausea and vomiting.  Genitourinary: Negative for dysuria.  Musculoskeletal: Negative for joint swelling.  Skin: Negative for rash.  Neurological: Negative for headaches.  Hematological: Does not bruise/bleed easily.  Psychiatric/Behavioral: Negative for dysphoric mood. The patient is not nervous/anxious.        Objective:  Physical Exam  Obese amb  bf nad/ easily confused with details of care relating to symptoms or meds, uses medical terms "this was allergy, this was asthma, different from my bronchitis"    Wt Readings from Last 3 Encounters:  09/25/17 216 lb 6.4 oz (98.2 kg)  05/09/17 211 lb (95.7 kg)  04/21/17 213 lb 8 oz (96.8 kg)     Vital signs reviewed - Note on arrival 02 sats  99% on RA      HEENT: nl  dentition, turbinates bilaterally, and oropharynx. Nl external ear canals without cough reflex   NECK :  without JVD/Nodes/TM/ nl carotid upstrokes bilaterally   LUNGS: no acc muscle use,  Nl contour chest which is clear to A and P bilaterally without cough on insp or exp maneuvers   CV:  RRR  no s3 or murmur or increase in P2, and no edema   ABD: obese/  soft and nontender with nl inspiratory excursion in the supine position. No bruits or organomegaly appreciated, bowel sounds nl  MS:  Nl gait/ ext warm without deformities, calf tenderness, cyanosis or clubbing No obvious joint restrictions   SKIN: warm and dry without lesions    NEURO:  alert, approp, nl sensorium with  no motor or cerebellar deficits apparent.                Assessment & Plan:

## 2017-09-26 ENCOUNTER — Encounter: Payer: Self-pay | Admitting: Internal Medicine

## 2017-09-26 NOTE — Assessment & Plan Note (Signed)
Allergy w/u [place] / ?wheland rec symb/singulair but pt not compliant - FENO 09/25/2017      < 5   - Spirometry 09/25/2017  FEV1 2.6 (100%)  Ratio 83 s curvature on no prior rx - 09/25/2017  After extensive coaching inhaler device,  effectiveness =    75% from baseline 50> try symb 80 2bid prn and maint on singulair/gerd rx     The most common causes of chronic cough in immunocompetent adults include the following: upper airway cough syndrome (UACS), previously referred to as postnasal drip syndrome (PNDS), which is caused by variety of rhinosinus conditions; (2) asthma; (3) GERD; (4) chronic bronchitis from cigarette smoking or other inhaled environmental irritants; (5) nonasthmatic eosinophilic bronchitis; and (6) bronchiectasis.   These conditions, singly or in combination, have accounted for up to 94% of the causes of chronic cough in prospective studies.   Other conditions have constituted no >6% of the causes in prospective studies These have included bronchogenic carcinoma, chronic interstitial pneumonia, sarcoidosis, left ventricular failure, ACEI-induced cough, and aspiration from a condition associated with pharyngeal dysfunction.    Chronic cough is often simultaneously caused by more than one condition. A single cause has been found from 38 to 82% of the time, multiple causes from 18 to 62%. Multiply caused cough has been the result of three diseases up to 42% of the time.       Based on very low feno and no curvature at all to spirometry suspect this is uacs from pnds vs gerd and not asthma though she may have very mild asthma   For asthma rec: maint singulair and use symb 80 prn Based on two studies from NEJM  378; 20 p 1865 (2018) and 380 : p2020-30 (2019) in pts with mild asthma it is reasonable to use low dose symbicort eg 80 2bid "prn" flare in this setting but I emphasized this was only shown with symbicort and takes advantage of the rapid onset of action but is not the same as  "rescue therapy" but can be stopped once the acute symptoms have resolved and the need for rescue has been minimized (< 2 x weekly)    For PNDS: singulair plus maint gerd rx and prn zyrtec    The standardized cough guidelines published in Chest by Lissa Morales in 2006 are still the best available and consist of a multiple step process (up to 12!) , not a single office visit,  and are intended  to address this problem logically,  with an alogrithm dependent on response to empiric treatment at  each progressive step  to determine a specific diagnosis with  minimal addtional testing needed. Therefore if adherence is an issue or can't be accurately verified,  it's very unlikely the standard evaluation and treatment will be successful here.    Furthermore, response to therapy (other than acute cough suppression, which should only be used short term with avoidance of narcotic containing cough syrups if possible), can be a gradual process for which the patient is not likely to  perceive immediate benefit.  Unlike going to an eye doctor where the best perscription is almost always the first one and is immediately effective, this is almost never the case in the management of chronic cough syndromes. Therefore the patient needs to commit up front to consistently adhere to recommendations  for up to 6 weeks of therapy directed at the likely underlying problem(s) before the response can be reasonably evaluated.   Therefore needs to return  in 4 weeks with all meds in hand using a trust but verify approach to confirm accurate Medication  Reconciliation The principal here is that until we are certain that the  patients are doing what we've asked, it makes no sense to ask them to do more.     Total time devoted to counseling  > 50 % of initial 60 min office visit:  review case with pt/ discussion of options/alternatives/ personally creating written customized instructions  in presence of pt  then going over those  specific  Instructions directly with the pt including how to use all of the meds but in particular covering each new medication in detail and the difference between the maintenance= "automatic" meds and the prns using an action plan format for the latter (If this problem/symptom => do that organization reading Left to right).  Please see AVS from this visit for a full list of these instructions which I personally wrote for this pt and  are unique to this visit.   See device teaching which extended face to face time for this visit

## 2017-10-25 ENCOUNTER — Other Ambulatory Visit (INDEPENDENT_AMBULATORY_CARE_PROVIDER_SITE_OTHER): Payer: BLUE CROSS/BLUE SHIELD

## 2017-10-25 ENCOUNTER — Ambulatory Visit (INDEPENDENT_AMBULATORY_CARE_PROVIDER_SITE_OTHER): Payer: BLUE CROSS/BLUE SHIELD | Admitting: Internal Medicine

## 2017-10-25 ENCOUNTER — Encounter: Payer: Self-pay | Admitting: Internal Medicine

## 2017-10-25 VITALS — BP 124/82 | HR 82 | Ht 65.0 in | Wt 219.4 lb

## 2017-10-25 DIAGNOSIS — J45991 Cough variant asthma: Secondary | ICD-10-CM

## 2017-10-25 LAB — CBC WITH DIFFERENTIAL/PLATELET
Basophils Absolute: 0.1 10*3/uL (ref 0.0–0.1)
Basophils Relative: 0.9 % (ref 0.0–3.0)
Eosinophils Absolute: 0.1 10*3/uL (ref 0.0–0.7)
Eosinophils Relative: 1.9 % (ref 0.0–5.0)
HCT: 35.6 % — ABNORMAL LOW (ref 36.0–46.0)
HEMOGLOBIN: 11.9 g/dL — AB (ref 12.0–15.0)
LYMPHS ABS: 2.8 10*3/uL (ref 0.7–4.0)
Lymphocytes Relative: 43 % (ref 12.0–46.0)
MCHC: 33.3 g/dL (ref 30.0–36.0)
MCV: 93.8 fl (ref 78.0–100.0)
MONO ABS: 0.5 10*3/uL (ref 0.1–1.0)
Monocytes Relative: 8 % (ref 3.0–12.0)
Neutro Abs: 3 10*3/uL (ref 1.4–7.7)
Neutrophils Relative %: 46.2 % (ref 43.0–77.0)
Platelets: 328 10*3/uL (ref 150.0–400.0)
RBC: 3.79 Mil/uL — AB (ref 3.87–5.11)
RDW: 13.2 % (ref 11.5–15.5)
WBC: 6.5 10*3/uL (ref 4.0–10.5)

## 2017-10-25 MED ORDER — BUDESONIDE-FORMOTEROL FUMARATE 80-4.5 MCG/ACT IN AERO: INHALATION_SPRAY | RESPIRATORY_TRACT | 11 refills | Status: AC

## 2017-10-25 MED ORDER — BUDESONIDE-FORMOTEROL FUMARATE 80-4.5 MCG/ACT IN AERO: INHALATION_SPRAY | RESPIRATORY_TRACT | 11 refills | Status: DC

## 2017-10-25 MED ORDER — FAMOTIDINE 20 MG PO TABS: ORAL_TABLET | ORAL | 11 refills | Status: DC

## 2017-10-25 NOTE — Progress Notes (Signed)
Subjective:    Patient ID: [name], female    DOB: 14-Jan-1975, 42 y.o.   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 Consult    Referred by [facility] for asthma.    not taking either inhaler presently, uses albuterol prn cough but not very effective  If not coughing no sob, up and down steps Worse at noct p sev hours ok assoc with pnds no better with zyrtec and did not use singulair as maint rx  Also has reflux despite zantac hs prn  rec Plan A = Automatic = singulair 10 mg each pm along with zantac 150 mg whether you think you need it or not and add Protonix 40 mg  Take 30-60 min before first meal of the day  Plan B = Backup For cough or wheeze take symbicort 80 up to 2 pffs every 12 hours as needed  Work on inhaler technique:  relax and gently blow all the way out then take a nice smooth deep breath back in, triggering the inhaler at same time you start breathing in.  Hold for up to 5 seconds if you can. Blow out thru nose. Rinse and gargle with water when done Plan C = Crisis - only use your albuterol inhaler if you use the symbicort first and it fails to control the cough - ok to use up to every 4 hours if needed  For itching / sneezing/ runny nose take zyrtec 10 mg one daily as needed  Please schedule a follow up office visit in 4 weeks, sooner if needed     10/25/2017  f/u  ov/[facility] re: asthma  Chief Complaint  Patient presents with  . Follow-up    1 month follow up. Per patient she has had 3 episodes of SOB since last visit but Symbicort helped out.    Dyspnea:  Not limited by breathing from desired activities   Cough: better/ some am cough since stopped hs h2 due [place]  Sleeping: mainly on side/ bed is flat, 2 pillows  SABA use: never 02: never      No obvious day to day or daytime variability or assoc excess/ purulent sputum or mucus plugs or hemoptysis or cp or chest tightness, subjective wheeze or overt sinus or hb symptoms.   Sleeping as above  without nocturnal  exacerbations  of respiratory  c/o's or need for noct saba. Also denies any obvious fluctuation of symptoms with weather or environmental changes or other aggravating or alleviating factors except as outlined above   No unusual exposure hx or h/o childhood pna/ asthma or knowledge of premature birth.  Current Allergies, Complete Past Medical History, Past Surgical History, Family History, and Social History were reviewed in [employer] record.  ROS  The following are not active complaints unless bolded Hoarseness, sore throat,  dysphagia, dental problems, itching, sneezing,  nasal congestion or discharge of excess mucus or purulent secretions, ear ache,   fever, chills, sweats, unintended wt loss or wt gain, classically pleuritic or exertional cp,  orthopnea pnd or arm/hand swelling  or leg swelling, presyncope, palpitations, abdominal pain, anorexia, nausea, vomiting, diarrhea  or change in bowel habits or change in bladder habits, change in stools or change in urine, dysuria, hematuria,  rash, arthralgias, visual complaints, headache, numbness, weakness or ataxia or problems with walking or coordination,  change in mood or  memory.        Current Meds  Medication Sig  . albuterol (PROVENTIL HFA;VENTOLIN HFA) 108 (90 Base) MCG/ACT inhaler Inhale 1-2 puffs into  the lungs every 6 (six) hours as needed for wheezing or shortness of breath.  . B Complex-C (SUPER B COMPLEX PO) Take 1 capsule by mouth daily.  . budesonide-formoterol (SYMBICORT) 80-4.5 MCG/ACT inhaler Take 2 puffs first thing in am and then another 2 puffs about 12 hours later.  . diclofenac sodium (VOLTAREN) 1 % GEL Apply 2-4 g topically 4 (four) times daily as needed (for pain.).   Marland Kitchen fluticasone (FLONASE) 50 MCG/ACT nasal spray Place 1 spray into both nostrils daily as needed.   . gabapentin (NEURONTIN) 600 MG tablet Take 600 mg by mouth 2 (two) times daily as needed (for pain.).   Marland Kitchen montelukast (SINGULAIR) 10 MG tablet Take 1 tablet (10 mg total) by mouth daily.  . pantoprazole (PROTONIX) 40 MG tablet Take 1 tablet (40 mg total) by mouth daily. Take 30-60 min before first meal of the day  . terbinafine (LAMISIL) 250 MG tablet Take 250 mg by mouth daily.  Marland Kitchen triamcinolone ointment (KENALOG) 0.1 % Apply 1 application topically daily.   .     .                    Objective:   Physical Exam  Obese amb bf nad    10/25/2017     219   09/25/17 216 lb 6.4 oz (98.2 kg)  05/09/17 211 lb (95.7 kg)  04/21/17 213 lb 8 oz (96.8 kg)      Vital signs reviewed - Note on arrival 02 sats  98% on RA      HEENT: nl dentition, turbinates bilaterally, and oropharynx. Nl external ear canals without cough reflex   NECK :  without JVD/Nodes/TM/ nl carotid upstrokes bilaterally   LUNGS: no acc muscle use,  Nl contour chest which is clear to A and P bilaterally without cough on insp or exp maneuvers   CV:  RRR  no s3 or murmur or increase in P2, and no edema   ABD:  soft and nontender with nl inspiratory excursion in the supine position. No bruits or organomegaly appreciated, bowel sounds nl  MS:  Nl gait/ ext warm without deformities, calf tenderness, cyanosis or clubbing No obvious joint restrictions   SKIN: warm and dry without lesions    NEURO:  alert, approp, nl sensorium with  no  motor or cerebellar deficits apparent.          Labs ordered 10/25/2017   Allergy profile        Assessment & Plan:

## 2017-10-25 NOTE — Patient Instructions (Signed)
Add pepcid 20 mg at bedtime   Work on inhaler technique:  relax and gently blow all the way out then take a nice smooth deep breath back in, triggering the inhaler at same time you start breathing in.  Hold for up to 5 seconds if you can. Blow out symbicort thru nose. Rinse and gargle with water when done   Please remember to go to the lab department downstairs in the basement  for your tests - we will call you with the results when they are available.      Please schedule a follow up visit in 3 months but call sooner if needed - bring inhalers with you

## 2017-10-26 LAB — RESPIRATORY ALLERGY PROFILE REGION II ~~LOC~~
ALLERGEN, D PTERNOYSSINUS, D1: 1.34 kU/L — AB
Allergen, A. alternata, m6: <0.1 kU/L
Allergen, Comm Silver Birch, t9: <0.1 kU/L
Allergen, P. notatum, m1: <0.1 kU/L
Aspergillus fumigatus, m3: <0.1 kU/L
Box Elder IgE: <0.1 kU/L
CLASS: 0
CLASS: 0
CLASS: 0
CLASS: 0
CLASS: 0
CLASS: 0
CLASS: 0
CLASS: 0
COMMON RAGWEED (SHORT) (W1) IGE: <0.1 kU/L
Class: 0
Class: 0
Class: 0
Class: 0
Class: 0
Class: 0
Class: 0
Class: 0
Class: 0
Class: 0
Class: 0
Class: 0
Class: 0
Class: 0
Class: 2
Class: 2
D. farinae: 1.21 kU/L — ABNORMAL HIGH
Elm IgE: <0.1 kU/L
IgE (Immunoglobulin E), Serum: 34 kU/L (ref <=114)
Johnson Grass: <0.1 kU/L
Pecan/Hickory Tree IgE: <0.1 kU/L

## 2017-10-26 LAB — INTERPRETATION:

## 2017-10-27 ENCOUNTER — Encounter: Payer: Self-pay | Admitting: Internal Medicine

## 2017-10-27 NOTE — Assessment & Plan Note (Signed)
Allergy w/u [place] / ?wheland rec symb/singulair but pt not compliant - FENO 09/25/2017      < 5   - Spirometry 09/25/2017  FEV1 2.6 (100%)  Ratio 83 s curvature on no prior rx - 09/25/2017     try symb 80 2bid prn and maint on singulair/gerd rx   - Allergy profile 10/25/17  >  Eos 0.1 /  IgE  34  RAST pos dust only  - 10/25/2017  After extensive coaching inhaler device,  effectiveness =    75%  Her symptoms are easy to control on minimal doses of symbicort while maint on singulair and max rx for gerd but some noct symptoms since stopped zantac due to [place]/d from [place]   Therefore only rec is add back hs pepcid and work on better symb/hfa technique   I had an extended discussion with the patient reviewing all relevant studies completed to date and  lasting 15 to 20 minutes of a 25 minute visit    See device teaching which extended face to face time for this visit.  Each maintenance medication was reviewed in detail including emphasizing most importantly the difference between maintenance and prns and under what circumstances the prns are to be triggered using an action plan format that is not reflected in the computer generated alphabetically organized AVS which I have not found useful in most complex patients, especially with respiratory illnesses  Please see AVS for specific instructions unique to this visit that I personally wrote and verbalized to the the pt in detail and then reviewed with pt  by my nurse highlighting any  changes in therapy recommended at today's visit to their plan of care.

## 2018-01-29 ENCOUNTER — Ambulatory Visit: Payer: BLUE CROSS/BLUE SHIELD | Admitting: Internal Medicine

## 2018-01-31 ENCOUNTER — Ambulatory Visit: Payer: BLUE CROSS/BLUE SHIELD | Admitting: Internal Medicine

## 2018-01-31 ENCOUNTER — Encounter: Payer: Self-pay | Admitting: Internal Medicine

## 2018-01-31 DIAGNOSIS — J45991 Cough variant asthma: Secondary | ICD-10-CM

## 2018-01-31 NOTE — Patient Instructions (Addendum)
Plan A = Automatic = symbicort 80 Take 2 puffs first thing in am and then another 2 puffs about 12 hours later.   Work on inhaler technique:  relax and gently blow all the way out then take a nice smooth deep breath back in, triggering the inhaler at same time you start breathing in.  Hold for up to 5 seconds if you can. Blow out thru nose. Rinse and gargle with water when done    Plan B = Backup Only use your albuterol inhaler as a rescue medication to be used if you can't catch your breath by resting or doing a relaxed purse lip breathing pattern.  - The less you use it, the better it will work when you need it. - Ok to use the inhaler up to 2 puffs  every 4 hours if you must but call for appointment if use goes up over your usual need - Don't leave home without it !!  (think of it like the spare tire for your car)     Resume  famotidine at bedtime   Please schedule a follow up visit in 3 months but call sooner if needed  with all medications /inhalers/ solutions in hand so we can verify exactly what you are taking. This includes all medications from all doctors and over the counters

## 2018-01-31 NOTE — Progress Notes (Signed)
Subjective:    Patient ID: [name], female    DOB: 08/02/1975, 43 y.o.   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 Consult    Referred by [facility] for asthma.    not taking either inhaler presently, uses albuterol prn cough but not very effective  If not coughing no sob, up and down steps Worse at noct p sev hours ok assoc with pnds no better with zyrtec and did not use singulair as maint rx  Also has reflux despite zantac hs prn  rec Plan A = Automatic = singulair 10 mg each pm along with zantac 150 mg whether you think you need it or not and add Protonix 40 mg  Take 30-60 min before first meal of the day  Plan B = Backup For cough or wheeze take symbicort 80 up to 2 pffs every 12 hours as needed  Work on inhaler technique:  relax and gently blow all the way out then take a nice smooth deep breath back in, triggering the inhaler at same time you start breathing in.  Hold for up to 5 seconds if you can. Blow out thru nose. Rinse and gargle with water when done Plan C = Crisis - only use your albuterol inhaler if you use the symbicort first and it fails to control the cough - ok to use up to every 4 hours if needed  For itching / sneezing/ runny nose take zyrtec 10 mg one daily as needed  Please schedule a follow up office visit in 4 weeks, sooner if needed     10/25/2017  f/u  ov/[name] re: asthma  Chief Complaint  Patient presents with  . Follow-up    1 month follow up. Per patient she has had 3 episodes of SOB since last visit but Symbicort helped out.    Dyspnea:  Not limited by breathing from desired activities   Cough: better/ some am cough since stopped hs h2 due [place]  Sleeping: mainly on side/ bed is flat, 2 pillows  rec Add pepcid 20 mg at bedtime  Work on inhaler technique:        01/31/2018  f/u ov/[name] re: cough variant asthma/ did not bring all meds, easily confused and also admits  non adherent with singulair Chief Complaint  Patient presents with  . Follow-up    Breathing is "fine". She has not been using her symbicort regularly and has only been using this as needed. She has used it about once per day over the past wk.   Dyspnea:  Not limited by breathing from desired activities   Cough: some cough in am when doesn't take hs pepcid Sleeping: 30 degrees with pillows /some hob elevation SABA use: sometime in am esp if forgets symbicort  02: none    No obvious day to day  or daytime variability or assoc excess/ purulent sputum or mucus plugs or hemoptysis or cp or chest tightness, subjective wheeze or overt sinus or hb symptoms.   Sleeping with raised bed  without nocturnal  or early am exacerbation  of respiratory  c/o's or need for noct saba. Also denies any obvious fluctuation of symptoms with weather or environmental changes or other aggravating or alleviating factors except as outlined above   No unusual exposure hx or h/o childhood pna/ asthma or knowledge of premature birth.  Current Allergies, Complete Past Medical History, Past Surgical History, Family History, and Social History were reviewed in [employer] record.  ROS  The following are not active complaints unless bolded Hoarseness, sore throat, dysphagia, dental problems, itching, sneezing,  nasal congestion or discharge of excess mucus or  purulent secretions, ear ache,   fever, chills, sweats, unintended wt loss or wt gain, classically pleuritic or exertional cp,  orthopnea pnd or arm/hand swelling  or leg swelling, presyncope, palpitations, abdominal pain, anorexia, nausea, vomiting, diarrhea  or change in bowel habits or change in bladder habits, change in stools or change in urine, dysuria, hematuria,  rash, arthralgias, visual complaints, headache, numbness, weakness or ataxia or problems with walking or coordination,  change in mood or  memory.        Current Meds  Medication Sig  . albuterol (PROVENTIL HFA;VENTOLIN HFA) 108 (90 Base) MCG/ACT inhaler Inhale 1-2 puffs into the lungs every 6 (six) hours as needed for wheezing or shortness of breath.  . B Complex-C (SUPER B COMPLEX PO) Take 1 capsule by mouth daily.  . budesonide-formoterol (SYMBICORT) 80-4.5 MCG/ACT inhaler Take 2 puffs first thing in am and then another 2 puffs about 12 hours later.  . diclofenac sodium (VOLTAREN) 1 % GEL Apply 2-4 g topically 4 (four) times daily as needed (for pain.).   Marland Kitchen famotidine (PEPCID) 20 MG tablet One at bedtime  . gabapentin (NEURONTIN) 600 MG tablet Take 600 mg by mouth 2 (two) times daily as needed (for pain.).   Marland Kitchen montelukast (SINGULAIR) 10 MG tablet Take 1 tablet (10 mg total) by mouth daily.  . pantoprazole (PROTONIX) 40 MG tablet Take 1 tablet (40 mg total) by mouth daily. Take 30-60 min before first meal of the day  . terbinafine (LAMISIL) 250 MG tablet Take 250 mg by mouth daily.  Marland Kitchen triamcinolone ointment (KENALOG) 0.1 % Apply 1 application topically daily.                         Objective:   Physical Exam  amb somber bf nad    01/31/2018        223   10/25/2017     219   09/25/17 216 lb 6.4 oz (98.2 kg)  05/09/17 211 lb (95.7 kg)  04/21/17 213 lb 8 oz (96.8 kg)      Vital signs reviewed - Note on arrival 02 sats  99% on RA     HEENT: nl dentition, turbinates bilaterally, and oropharynx. Nl external ear  canals without cough reflex   NECK :  without JVD/Nodes/TM/ nl carotid upstrokes bilaterally   LUNGS: no acc muscle use,  Nl contour chest which is clear to A and P bilaterally without cough on insp or exp maneuvers   CV:  RRR  no s3 or murmur or increase in P2, and no edema   ABD:  soft and nontender with nl inspiratory excursion in the supine position.  No bruits or organomegaly appreciated, bowel sounds nl  MS:  Nl gait/ ext warm without deformities, calf tenderness, cyanosis or clubbing No obvious joint restrictions   SKIN: warm and dry without lesions    NEURO:  alert, approp, nl sensorium with  no motor or cerebellar deficits apparent.                Assessment & Plan:

## 2018-02-01 ENCOUNTER — Encounter: Payer: Self-pay | Admitting: Internal Medicine

## 2018-02-01 NOTE — Assessment & Plan Note (Signed)
Onset variable "bronchitis" since 2003  Allergy w/u [place] / ?wheland rec symb/singulair but pt not compliant - FENO 09/25/2017      < 5   - Spirometry 09/25/2017  FEV1 2.6 (100%)  Ratio 83 s curvature on no prior rx - 09/25/2017     try symb 80 2bid prn and maint on singulair/gerd rx  - Allergy profile 10/25/17  >  Eos 0.1 /  IgE  34  RAST pos dust only   - 01/31/2018  After extensive coaching inhaler device,  effectiveness =    75%   She is easily confused with instructions and freq breaking the rule of 2s when forgets to take singulair or hs h2 so it's difficult to sort out which of the 2 "add on's"  is the more important or whether they both are so rec  1) take sym 80 automatically, not prn  2) use saba only as rescue 3) add back h2 hs consistently  4) return in 3 m with all meds in hand using a trust but verify approach to confirm accurate Medication  Reconciliation The principal here is that until we are certain that the  patients are doing what we've asked, it makes no sense to ask them to do more.    I had an extended discussion with the patient reviewing all relevant studies completed to date and  lasting 15 to 20 minutes of a 25 minute visit    See device teaching which extended face to face time for this visit.  Each maintenance medication was reviewed in detail including emphasizing most importantly the difference between maintenance and prns and under what circumstances the prns are to be triggered using an action plan format that is not reflected in the computer generated alphabetically organized AVS which I have not found useful in most complex patients, especially with respiratory illnesses  Please see AVS for specific instructions unique to this visit that I personally wrote and verbalized to the the pt in detail and then reviewed with pt  by my nurse highlighting any  changes in therapy recommended at today's visit to their plan of care.

## 2018-02-26 ENCOUNTER — Other Ambulatory Visit: Payer: Self-pay | Admitting: Internal Medicine

## 2018-03-02 ENCOUNTER — Ambulatory Visit: Payer: BLUE CROSS/BLUE SHIELD | Admitting: Family Medicine

## 2018-03-06 ENCOUNTER — Ambulatory Visit: Payer: BLUE CROSS/BLUE SHIELD | Admitting: Family Medicine

## 2018-03-08 ENCOUNTER — Ambulatory Visit: Payer: BLUE CROSS/BLUE SHIELD | Admitting: Family Medicine

## 2018-03-08 VITALS — BP 118/80 | HR 86 | Temp 98.0°F | Wt 225.0 lb

## 2018-03-08 DIAGNOSIS — G5602 Carpal tunnel syndrome, left upper limb: Secondary | ICD-10-CM

## 2018-03-08 DIAGNOSIS — G5622 Lesion of ulnar nerve, left upper limb: Secondary | ICD-10-CM | POA: Diagnosis not present

## 2018-03-08 NOTE — Progress Notes (Signed)
Subjective:    Patient ID: [name], female    DOB: March 13, 1975, 43 y.o.   MRN: [id number]  No chief complaint on file.   HPI Patient was seen today for f/u.  Has pain in left elbow that hurts with bending.  Pt also endorses L wrist pain, numbness and stabbing sensation in her left fourth and fifth digits.  The pain is described as a nagging, 3/10 constant pain that may become worse and increase to 10/10.  When pain is at its worst pt wears her wrist brace.  Pt using gabapentin 600 mg BID and Voltaren gel.  These medications not helping much, may ease the pain until pt moves.  Driving is becoming difficult.  Pt has a h/o cervical spine surgery at the [facility] in 2012.  Had an MRI at that time, but no recent imaging.  Pt had NCS/EMG in the past.  Past Medical History:  Diagnosis Date  . Allergy   . Anal fissure   . Arthritis   . Asthma   . Blood in stool   . Cholelithiasis   . Depression   . GERD (gastroesophageal reflux disease)   . Hypertension   . Liver hemangioma   . Pneumonia    as a child  . Pre-diabetes   . SVD (spontaneous vaginal delivery) 07/15/2016  . Ulcer     Allergies  Allergen Reactions  . Hydrocodone Itching  . Acetaminophen Hives and Itching  . Guaifenesin Hives and Itching  . Pseudoephedrine Hives, Itching and Other (See Comments)    Burning  . Shellfish Allergy Hives and Itching    ROS General: Denies fever, chills, night sweats, changes in weight, changes in appetite HEENT: Denies headaches, ear pain, changes in vision, rhinorrhea, sore throat CV: Denies CP, palpitations, SOB, orthopnea Pulm: Denies SOB, cough, wheezing GI: Denies abdominal pain, nausea, vomiting, diarrhea, constipation GU: Denies dysuria, hematuria, frequency, vaginal discharge Msk: Denies muscle cramps.   +L elbow pain, L wrist pain Neuro: Denies weakness, tingling  +nubmenss in hand Skin: Denies rashes, bruising Psych: Denies depression, anxiety, hallucinations     Objective:    Blood pressure 118/80, pulse 86, temperature 98 F (36.7 C), weight 225 lb (102.1 kg), SpO2 97 %, not currently breastfeeding.  Gen. Pleasant, well-nourished, in no distress, normal affect  HEENT: Leonore/AT, face symmetric, no scleral icterus, PERRLA, nares patent without drainage, pharynx without erythema or exudate. Lungs: no accessory muscle use, CTAB, no wheezes or rales Cardiovascular: RRR, no m/r/g, no peripheral edema Musculoskeletal: no edema of b/l hands, wrist, or arms.  Negative Tinel's sign b/l.  Positive Phalen's sign L wrist.  Tapping on left lateral epicondyle does not elicit symptoms.  Pain with flexion of left wrist.  No pain with extension of L wrist.  R wrist normal.  Grip strength 5/5 in R hand, 4/5 in L hand.  No deformities, no cyanosis or clubbing, normal tone Neuro:  A&Ox3, CN II-XII intact, normal gait Skin:  Warm, no lesions/ rash   Wt Readings from Last 3 Encounters:  01/31/18 223 lb 12.8 oz (101.5 kg)  10/25/17 219 lb 6.4 oz (99.5 kg)  09/25/17 216 lb 6.4 oz (98.2 kg)    Lab Results  Component Value Date   WBC 6.5 10/25/2017   HGB 11.9 (L) 10/25/2017   HCT 35.6 (L) 10/25/2017   PLT 328.0 10/25/2017   GLUCOSE 101 (H) 04/13/2017   CHOL 208 (H) 07/05/2011   TRIG 83.0 07/05/2011   HDL 55.70 07/05/2011  LDLDIRECT 131.2 07/05/2011   ALT 40 04/13/2017   AST 22 04/13/2017   NA 138 04/13/2017   K 4.2 04/13/2017   CL 105 04/13/2017   CREATININE 0.88 04/13/2017   BUN 5 (L) 04/13/2017   CO2 24 04/13/2017   TSH 2.02 07/05/2011   HGBA1C 5.6 04/13/2017    Assessment/Plan:  Entrapment of left ulnar nerve  -worsening -continue gabapentin 600 mg BID and voltaren gel -referral for NCS/EMG - Plan: Ambulatory referral to Neurology  Carpal tunnel syndrome of left wrist  -worsening -Discussed wearing wrist brace at night consistently -Discussed wrist/hand exercises -Discussed referral to for NCS/EMG -continue gabapentin 600 mg BID and Voltaren  gel. - Plan: Ambulatory referral to Neurology   Follow-up PRN  Grier Mitts, MD

## 2018-03-08 NOTE — Patient Instructions (Signed)
Carpal Tunnel Syndrome  Carpal tunnel syndrome is a condition that causes pain in your hand and arm. The carpal tunnel is a narrow area located on the palm side of your wrist. Repeated wrist motion or certain diseases may cause swelling within the tunnel. This swelling pinches the main nerve in the wrist (median nerve). What are the causes? This condition may be caused by:  Repeated wrist motions.  Wrist injuries.  Arthritis.  A cyst or tumor in the carpal tunnel.  Fluid buildup during pregnancy. Sometimes the cause of this condition is not known. What increases the risk? The following factors may make you more likely to develop this condition:  Having a job, such as being a butcher or a cashier, that requires you to repeatedly move your wrist in the same motion.  Being a woman.  Having certain conditions, such as: ? Diabetes. ? Obesity. ? An underactive thyroid (hypothyroidism). ? Kidney failure. What are the signs or symptoms? Symptoms of this condition include:  A tingling feeling in your fingers, especially in your thumb, index, and middle fingers.  Tingling or numbness in your hand.  An aching feeling in your entire arm, especially when your wrist and elbow are bent for a long time.  Wrist pain that goes up your arm to your shoulder.  Pain that goes down into your palm or fingers.  A weak feeling in your hands. You may have trouble grabbing and holding items. Your symptoms may feel worse during the night. How is this diagnosed? This condition is diagnosed with a medical history and physical exam. You may also have tests, including:  Electromyogram (EMG). This test measures electrical signals sent by your nerves into the muscles.  Nerve conduction study. This test measures how well electrical signals pass through your nerves.  Imaging tests, such as X-rays, ultrasound, and MRI. These tests check for possible causes of your condition. How is this treated? This  condition may be treated with:  Lifestyle changes. It is important to stop or change the activity that caused your condition.  Doing exercise and activities to strengthen your muscles and bones (physical therapy).  Learning how to use your hand again after diagnosis (occupational therapy).  Medicines for pain and inflammation. This may include medicine that is injected into your wrist.  A wrist splint.  Surgery. Follow these instructions at home: If you have a splint:  Wear the splint as told by your health care provider. Remove it only as told by your health care provider.  Loosen the splint if your fingers tingle, become numb, or turn cold and blue.  Keep the splint clean.  If the splint is not waterproof: ? Do not let it get wet. ? Cover it with a watertight covering when you take a bath or shower. Managing pain, stiffness, and swelling   If directed, put ice on the painful area: ? If you have a removable splint, remove it as told by your health care provider. ? Put ice in a plastic bag. ? Place a towel between your skin and the bag. ? Leave the ice on for 20 minutes, 2-3 times per day. General instructions  Take over-the-counter and prescription medicines only as told by your health care provider.  Rest your wrist from any activity that may be causing your pain. If your condition is work related, talk with your employer about changes that can be made, such as getting a wrist pad to use while typing.  Do any exercises as told   by your health care provider, physical therapist, or occupational therapist.  Keep all follow-up visits as told by your health care provider. This is important. Contact a health care provider if:  You have new symptoms.  Your pain is not controlled with medicines.  Your symptoms get worse. Get help right away if:  You have severe numbness or tingling in your wrist or hand. Summary  Carpal tunnel syndrome is a condition that causes pain in  your hand and arm.  It is usually caused by repeated wrist motions.  Lifestyle changes and medicines are used to treat carpal tunnel syndrome. Surgery may be recommended.  Follow your health care provider's instructions about wearing a splint, resting from activity, keeping follow-up visits, and calling for help. This information is not intended to replace advice given to you by your health care provider. Make sure you discuss any questions you have with your health care provider. Document Released: 12/25/1999 Document Revised: 05/05/2017 Document Reviewed: 05/05/2017 Elsevier Interactive Patient Education  2019 [employer] Inc[employer]  Hand Exercises Hand exercises can be helpful to almost anyone. These exercises can strengthen the hands, improve flexibility and movement, and increase blood flow to the hands. These results can make work and daily tasks easier. Hand exercises can be especially helpful for people who have joint pain from arthritis or have nerve damage from overuse (carpal tunnel syndrome). These exercises can also help people who have injured a hand. Most of these hand exercises are fairly gentle stretching routines. You can do them often throughout the day. Still, it is a good idea to ask your health care provider which exercises would be best for you. Warming your hands before exercise may help to reduce stiffness. You can do this with gentle massage or by placing your hands in warm water for 15 minutes. Also, make sure you pay attention to your level of hand pain as you begin an exercise routine. Exercises Knuckle bend Repeat this exercise 5-10 times with each hand. 1. Stand or sit with your arm, hand, and all five fingers pointed straight up. Make sure your wrist is straight. 2. Gently and slowly bend your fingers down and inward until the tips of your fingers are touching the tops of your palm. 3. Hold this position for a few seconds. 4. Extend your fingers out to their original  position, all pointing straight up again. Finger fan Repeat this exercise 5-10 times with each hand. 1. Hold your arm and hand out in front of you. Keep your wrist straight. 2. Squeeze your hand into a fist. 3. Hold this position for a few seconds. 4. Edison Simon out, or spread apart, your hand and fingers as much as possible, stretching every joint fully. Tabletop Repeat this exercise 5-10 times with each hand. 1. Stand or sit with your arm, hand, and all five fingers pointed straight up. Make sure your wrist is straight. 2. Gently and slowly bend your fingers at the knuckles where they meet the hand until your hand is making an upside-down L shape. Your fingers should form a tabletop. 3. Hold this position for a few seconds. 4. Extend your fingers out to their original position, all pointing straight up again. Making Os Repeat this exercise 5-10 times with each hand. 1. Stand or sit with your arm, hand, and all five fingers pointed straight up. Make sure your wrist is straight. 2. Make an O shape by touching your pointer finger to your thumb. Hold for a few seconds. Then open your hand  wide. 3. Repeat this motion with each finger on your hand. Table spread Repeat this exercise 5-10 times with each hand. 1. Place your hand on a table with your palm facing down. Make sure your wrist is straight. 2. Spread your fingers out as much as possible. Hold this position for a few seconds. 3. Slide your fingers back together again. Hold for a few seconds. Ball grip Repeat this exercise 10-15 times with each hand. 1. Hold a tennis ball or another soft ball in your hand. 2. While slowly increasing pressure, squeeze the ball as hard as possible. 3. Squeeze as hard as you can for 3-5 seconds. 4. Relax and repeat.  Wrist curls Repeat this exercise 10-15 times with each hand. 1. Sit in a chair that has armrests. 2. Hold a light weight in your hand, such as a dumbbell that weighs 1-3 pounds (0.5-1.4 kg). Ask  your health care provider what weight would be best for you. 3. Rest your hand just over the end of the chair arm with your palm facing up. 4. Gently pivot your wrist up and down while holding the weight. Do not twist your wrist from side to side. Contact a health care provider if:  Your hand pain or discomfort gets much worse when you do an exercise.  Your hand pain or discomfort does not improve within 2 hours after you exercise. If you have any of these problems, stop doing these exercises right away. Do not do them again unless your health care provider says that you can. Get help right away if:  You develop sudden, severe hand pain. If this happens, stop doing these exercises right away. Do not do them again unless your health care provider says that you can. This information is not intended to replace advice given to you by your health care provider. Make sure you discuss any questions you have with your health care provider. Document Released: 12/08/2014 Document Revised: 05/02/2017 Document Reviewed: 07/07/2014 Elsevier Interactive Patient Education  2019 [employer].

## 2018-03-10 ENCOUNTER — Encounter: Payer: Self-pay | Admitting: Family Medicine

## 2018-03-12 ENCOUNTER — Other Ambulatory Visit: Payer: Self-pay | Admitting: *Deleted

## 2018-03-12 DIAGNOSIS — G5602 Carpal tunnel syndrome, left upper limb: Secondary | ICD-10-CM

## 2018-03-15 ENCOUNTER — Ambulatory Visit (INDEPENDENT_AMBULATORY_CARE_PROVIDER_SITE_OTHER): Payer: BLUE CROSS/BLUE SHIELD | Admitting: Neurology

## 2018-03-15 DIAGNOSIS — M79642 Pain in left hand: Secondary | ICD-10-CM

## 2018-03-15 DIAGNOSIS — G5602 Carpal tunnel syndrome, left upper limb: Secondary | ICD-10-CM | POA: Diagnosis not present

## 2018-03-15 NOTE — Procedures (Signed)
[facility]  [address], [place]  [place], [place] [address] Tel: [phone] Fax:  [phone] Test Date:  03/15/2018  Patient: [name] DOB: [birth year] Physician: Narda Amber, DO  Sex: Female Height: 5\' 5"  Ref Phys: Grier Mitts, MD  ID#: [id number] Temp: 37.4C Technician:    Patient Complaints: This is a 43 year old female referred for evaluation of left elbow pain, wrist pain, and numbness and tingling involving the fourth and fifth fingers.  NCV & EMG Findings: Extensive electrodiagnostic testing of the left upper extremity shows:  1. Left median, ulnar, and mixed palmar sensory responses are within normal limits. 2. Left median and ulnar motor responses are within normal limits. 3. There is no evidence of active or chronic motor axonal loss changes affecting any of the tested muscles.  Motor unit configuration and recruitment pattern is within normal limits.   Impression: This is a normal study of the left upper extremity.  In particular, there is no evidence of carpal tunnel syndrome, ulnar neuropathy, or a cervical radiculopathy.   ___________________________ Narda Amber, DO    Nerve Conduction Studies Anti Sensory Summary Table   Site NR Peak (ms) Norm Peak (ms) P-T Amp (V) Norm P-T Amp  Left Median Anti Sensory (2nd Digit)  37.4C  Wrist    2.6 <3.4 42.1 >20  Left Ulnar Anti Sensory (5th Digit)  37.4C  Wrist    2.3 <3.1 23.1 >12   Motor Summary Table   Site NR Onset (ms) Norm Onset (ms) O-P Amp (mV) Norm O-P Amp Site1 Site2 Delta-0 (ms) Dist (cm) Vel (m/s) Norm Vel (m/s)  Left Median Motor (Abd Poll Brev)  37.4C  Wrist    2.5 <3.9 14.0 >6 Elbow Wrist 4.4 30.0 68 >50  Elbow    6.9  14.0         Left Ulnar Motor (Abd Dig Minimi)  37.4C  Wrist    2.0 <3.1 13.1 >7 B Elbow Wrist 3.6 23.0 64 >50  B Elbow    5.6  13.1  A Elbow B Elbow 1.7 10.0 59 >50  A Elbow    7.3  12.9          Comparison Summary Table   Site NR Peak (ms) Norm  Peak (ms) P-T Amp (V) Site1 Site2 Delta-P (ms) Norm Delta (ms)  Left Median/Ulnar Palm Comparison (Wrist - 8cm)  37.4C  Median Palm    1.6 <2.2 50.5 Median Palm Ulnar Palm 0.1   Ulnar Palm    1.5 <2.2 16.3       EMG   Side Muscle Ins Act Fibs Psw Fasc Number Recrt Dur Dur. Amp Amp. Poly Poly. Comment  Left 1stDorInt Nml Nml Nml Nml Nml Nml Nml Nml Nml Nml Nml Nml N/A  Left Ext Indicis Nml Nml Nml Nml Nml Nml Nml Nml Nml Nml Nml Nml N/A  Left PronatorTeres Nml Nml Nml Nml Nml Nml Nml Nml Nml Nml Nml Nml N/A  Left Biceps Nml Nml Nml Nml Nml Nml Nml Nml Nml Nml Nml Nml N/A  Left Triceps Nml Nml Nml Nml Nml Nml Nml Nml Nml Nml Nml Nml N/A  Left Deltoid Nml Nml Nml Nml Nml Nml Nml Nml Nml Nml Nml Nml N/A      Waveforms:

## 2018-05-02 ENCOUNTER — Other Ambulatory Visit: Payer: Self-pay

## 2018-05-02 ENCOUNTER — Ambulatory Visit: Payer: BLUE CROSS/BLUE SHIELD | Admitting: Internal Medicine

## 2018-05-02 DIAGNOSIS — J45991 Cough variant asthma: Secondary | ICD-10-CM

## 2018-05-02 NOTE — Progress Notes (Signed)
Pt did not answer, tied twice on 05/02/2018 and again 05/04/2018    Christinia Gully, MD Pulmonary and [place] [phone] After 5:30 PM or weekends, use [facility] [phone]

## 2018-05-04 NOTE — Assessment & Plan Note (Signed)
Not able to do televisit week of 05/02/2018 > LMOM to call back

## 2018-05-30 ENCOUNTER — Telehealth: Payer: Self-pay | Admitting: *Deleted

## 2018-05-30 ENCOUNTER — Ambulatory Visit (INDEPENDENT_AMBULATORY_CARE_PROVIDER_SITE_OTHER): Payer: BLUE CROSS/BLUE SHIELD | Admitting: Family Medicine

## 2018-05-30 ENCOUNTER — Other Ambulatory Visit: Payer: Self-pay

## 2018-05-30 DIAGNOSIS — J069 Acute upper respiratory infection, unspecified: Secondary | ICD-10-CM

## 2018-05-30 DIAGNOSIS — Z20822 Contact with and (suspected) exposure to covid-19: Secondary | ICD-10-CM

## 2018-05-30 NOTE — Telephone Encounter (Signed)
Message received from [facility] to request pt to be scheduled for COVID-19 testing. Noted in office note on 05/30/18 that Dr. Elease Hashimoto request for pt to be tested for COVID-19.  Patient contacted and scheduled for testing on 05/31/18 at [facility] location. Pt advised to remain in the car and to wear a mask at the time of the appt. Understanding verbalized.

## 2018-05-30 NOTE — Progress Notes (Signed)
Patient ID: [name], female   DOB: 29-May-1975, 43 y.o.   MRN: [id number]  This visit type was conducted due to national recommendations for restrictions regarding the COVID-19 pandemic in an effort to limit this patient's exposure and mitigate transmission in our community.   Virtual Visit via Video Note  I connected with [name] on 05/30/18 at  4:00 PM EDT by a video enabled telemedicine application and verified that I am speaking with the correct person using two identifiers.  Location patient: home Location provider:work or home office Persons participating in the virtual visit: patient, provider  I discussed the limitations of evaluation and management by telemedicine and the availability of in person appointments. The patient expressed understanding and agreed to proceed.   HPI: Patient has acute respiratory illness.  She noted last Saturday night she had some sore throat.  By Sunday she had diffuse body aches, fatigue, headache, chills and fever 101.3.  By Monday she developed some cough.  No dyspnea.  No loss of taste or smell.  No nausea or vomiting.    She had taken her children to a couple of appointments late last week at specialty clinics but otherwise has been fairly quarantined.  Her husband has no symptoms.  She has 3 children who are asymptomatic.  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  She is on Symbicort and takes albuterol as needed.   ROS: See pertinent positives and negatives per HPI.  Past Medical History:  Diagnosis Date  . Allergy   . Anal fissure   . Arthritis   . Asthma   . Blood in stool   . Cholelithiasis   . Depression   . GERD (gastroesophageal reflux disease)   . Hypertension   . Liver hemangioma   . Pneumonia    as a child  . Pre-diabetes   . SVD (spontaneous vaginal delivery) 07/15/2016  . Ulcer     Past Surgical History:  Procedure Laterality Date  . CERVICAL DISCECTOMY  03-2008  . CHOLECYSTECTOMY N/A 04/21/2017   Procedure:  LAPAROSCOPIC CHOLECYSTECTOMY;  Surgeon: Clovis Riley, MD;  Location: [place];  Service: General;  Laterality: N/A;  . COLONOSCOPY  2010  . WISDOM TOOTH EXTRACTION      Family History  Problem Relation Age of Onset  . Breast cancer Maternal Grandmother   . Ovarian cancer Mother   . Diabetes Paternal Grandmother   . Hypertension Father   . Seizures Father   . Colon cancer Neg Hx   . Esophageal cancer Neg Hx   . Stomach cancer Neg Hx   . Kidney disease Neg Hx   . Liver disease Neg Hx     SOCIAL HX: Non-smoker   Current Outpatient Medications:  .  albuterol (PROVENTIL HFA;VENTOLIN HFA) 108 (90 Base) MCG/ACT inhaler, Inhale 1-2 puffs into the lungs every 6 (six) hours as needed for wheezing or shortness of breath., Disp: , Rfl:  .  B Complex-C (SUPER B COMPLEX PO), Take 1 capsule by mouth daily., Disp: , Rfl:  .  budesonide-formoterol (SYMBICORT) 80-4.5 MCG/ACT inhaler, Take 2 puffs first thing in am and then another 2 puffs about 12 hours later., Disp: 1 Inhaler, Rfl: 11 .  diclofenac sodium (VOLTAREN) 1 % GEL, Apply 2-4 g topically 4 (four) times daily as needed (for pain.). , Disp: , Rfl:  .  famotidine (PEPCID) 20 MG tablet, One at bedtime, Disp: 30 tablet, Rfl: 11 .  gabapentin (NEURONTIN) 600 MG tablet, Take 600 mg by mouth  2 (two) times daily as needed (for pain.). , Disp: , Rfl:  .  montelukast (SINGULAIR) 10 MG tablet, Take 1 tablet (10 mg total) by mouth daily., Disp: 30 tablet, Rfl: 2 .  pantoprazole (PROTONIX) 40 MG tablet, TAKE 1 TABLET (40 MG TOTAL) BY MOUTH DAILY. TAKE 30-60 MIN BEFORE FIRST MEAL OF THE DAY, Disp: 30 tablet, Rfl: 2 .  terbinafine (LAMISIL) 250 MG tablet, Take 250 mg by mouth daily., Disp: , Rfl: 3 .  triamcinolone ointment (KENALOG) 0.1 %, Apply 1 application topically daily. , Disp: , Rfl:   EXAM:  VITALS per patient if applicable:  GENERAL: alert, oriented, appears well and in no acute distress  HEENT: atraumatic, conjunttiva clear, no obvious  abnormalities on inspection of external nose and ears  NECK: normal movements of the head and neck  LUNGS: on inspection no signs of respiratory distress, breathing rate appears normal, no obvious gross SOB, gasping or wheezing  CV: no obvious cyanosis  MS: moves all visible extremities without noticeable abnormality  PSYCH/NEURO: pleasant and cooperative, no obvious depression or anxiety, speech and thought processing grossly intact  ASSESSMENT AND PLAN:  Discussed the following assessment and plan:  Acute upper respiratory illness.  Patient relates multiple symptoms as above including sore throat, chills, fever, body aches, fatigue, headache.  No dyspnea.  No respiratory distress.  She does have risk factor of cough variant asthma  -Refer through [facility] for possible coronavirus screening -Continue her usual inhalers for now and follow-up immediately for any increased shortness of breath or other concerns.  Otherwise treat symptomatically     I discussed the assessment and treatment plan with the patient. The patient was provided an opportunity to ask questions and all were answered. The patient agreed with the plan and demonstrated an understanding of the instructions.   The patient was advised to call back or seek an in-person evaluation if the symptoms worsen or if the condition fails to improve as anticipated.   Carolann Littler, MD

## 2018-05-31 ENCOUNTER — Other Ambulatory Visit: Payer: BLUE CROSS/BLUE SHIELD

## 2018-05-31 DIAGNOSIS — Z20822 Contact with and (suspected) exposure to covid-19: Secondary | ICD-10-CM

## 2018-05-31 DIAGNOSIS — J45909 Unspecified asthma, uncomplicated: Secondary | ICD-10-CM | POA: Insufficient documentation

## 2018-05-31 DIAGNOSIS — R6889 Other general symptoms and signs: Secondary | ICD-10-CM | POA: Diagnosis not present

## 2018-05-31 DIAGNOSIS — F418 Other specified anxiety disorders: Secondary | ICD-10-CM | POA: Insufficient documentation

## 2018-06-01 ENCOUNTER — Encounter: Payer: Self-pay | Admitting: Family Medicine

## 2018-06-01 ENCOUNTER — Other Ambulatory Visit: Payer: Self-pay

## 2018-06-01 ENCOUNTER — Ambulatory Visit (INDEPENDENT_AMBULATORY_CARE_PROVIDER_SITE_OTHER): Payer: BLUE CROSS/BLUE SHIELD | Admitting: Family Medicine

## 2018-06-01 DIAGNOSIS — J029 Acute pharyngitis, unspecified: Secondary | ICD-10-CM

## 2018-06-01 DIAGNOSIS — J0100 Acute maxillary sinusitis, unspecified: Secondary | ICD-10-CM | POA: Diagnosis not present

## 2018-06-01 DIAGNOSIS — R6889 Other general symptoms and signs: Secondary | ICD-10-CM

## 2018-06-01 DIAGNOSIS — Z20822 Contact with and (suspected) exposure to covid-19: Secondary | ICD-10-CM

## 2018-06-01 MED ORDER — PREDNISONE 20 MG PO TABS: 60.0000 mg | ORAL_TABLET | Freq: Every day | ORAL | 0 refills | Status: AC

## 2018-06-01 MED ORDER — AMOXICILLIN-POT CLAVULANATE 875-125 MG PO TABS: 1.0000 | ORAL_TABLET | Freq: Two times a day (BID) | ORAL | 0 refills | Status: AC

## 2018-06-01 NOTE — Progress Notes (Signed)
Virtual Visit via Video Note  I connected with [name] on 06/01/18 at  1:00 PM EDT by a video enabled telemedicine application and verified that I am speaking with the correct person using two identifiers.  Location patient: home Location provider:work or home office Persons participating in the virtual visit: patient, provider  I discussed the limitations of evaluation and management by telemedicine and the availability of in person appointments. The patient expressed understanding and agreed to proceed.   HPI: Pt is a 43 yo female with pmh sig for h/o cough variant asthma, HTN, GERD.  Seen 5/20 for URI symptoms that started last Saturday.  Pt tested for COVID-19, results pending.  Pt with worsening sore throat.  Endorses decreased energy, fever Tmax 101.3, decreased appetite, HA, sore throat, weak.  Today pt feels fine other than HA, drainage, States throat is red at the back tonsils are swollen, hurts to talk, b/l ear ache, neck tender to touch.  Unable to swallow, keeps spitting saliva.  Endorses dark green, red sputum, increased facial pressure.  Not coughing as much.  Using saline rinse.   ROS: See pertinent positives and negatives per HPI.  Past Medical History:  Diagnosis Date  . Allergy   . Anal fissure   . Arthritis   . Asthma   . Blood in stool   . Cholelithiasis   . Depression   . GERD (gastroesophageal reflux disease)   . Hypertension   . Liver hemangioma   . Pneumonia    as a child  . Pre-diabetes   . SVD (spontaneous vaginal delivery) 07/15/2016  . Ulcer     Past Surgical History:  Procedure Laterality Date  . CERVICAL DISCECTOMY  03-2008  . CHOLECYSTECTOMY N/A 04/21/2017   Procedure: LAPAROSCOPIC CHOLECYSTECTOMY;  Surgeon: Clovis Riley, MD;  Location: [place];  Service: General;  Laterality: N/A;  . COLONOSCOPY  2010  . WISDOM TOOTH EXTRACTION      Family History  Problem Relation Age of Onset  . Breast cancer Maternal Grandmother   . Ovarian  cancer Mother   . Diabetes Paternal Grandmother   . Hypertension Father   . Seizures Father   . Colon cancer Neg Hx   . Esophageal cancer Neg Hx   . Stomach cancer Neg Hx   . Kidney disease Neg Hx   . Liver disease Neg Hx     SOCIAL HX:    Current Outpatient Medications:  .  albuterol (PROVENTIL HFA;VENTOLIN HFA) 108 (90 Base) MCG/ACT inhaler, Inhale 1-2 puffs into the lungs every 6 (six) hours as needed for wheezing or shortness of breath., Disp: , Rfl:  .  B Complex-C (SUPER B COMPLEX PO), Take 1 capsule by mouth daily., Disp: , Rfl:  .  budesonide-formoterol (SYMBICORT) 80-4.5 MCG/ACT inhaler, Take 2 puffs first thing in am and then another 2 puffs about 12 hours later., Disp: 1 Inhaler, Rfl: 11 .  diclofenac sodium (VOLTAREN) 1 % GEL, Apply 2-4 g topically 4 (four) times daily as needed (for pain.). , Disp: , Rfl:  .  famotidine (PEPCID) 20 MG tablet, One at bedtime, Disp: 30 tablet, Rfl: 11 .  gabapentin (NEURONTIN) 600 MG tablet, Take 600 mg by mouth 2 (two) times daily as needed (for pain.). , Disp: , Rfl:  .  montelukast (SINGULAIR) 10 MG tablet, Take 1 tablet (10 mg total) by mouth daily., Disp: 30 tablet, Rfl: 2 .  pantoprazole (PROTONIX) 40 MG tablet, TAKE 1 TABLET (40 MG TOTAL) BY MOUTH  DAILY. TAKE 30-60 MIN BEFORE FIRST MEAL OF THE DAY, Disp: 30 tablet, Rfl: 2 .  terbinafine (LAMISIL) 250 MG tablet, Take 250 mg by mouth daily., Disp: , Rfl: 3 .  triamcinolone ointment (KENALOG) 0.1 %, Apply 1 application topically daily. , Disp: , Rfl:   EXAM:  VITALS per patient if applicable:  RR between 12-20 bpm  GENERAL: alert, oriented, appears well and in no acute distress  HEENT: atraumatic, conjunctiva clear, no obvious abnormalities on inspection of external nose and ears.  Unable to see pharynx.  NECK: normal movements of the head and neck  LUNGS: on inspection no signs of respiratory distress, breathing rate appears normal, no obvious gross SOB, gasping or wheezing  CV:  no obvious cyanosis  MS: moves all visible extremities without noticeable abnormality  PSYCH/NEURO: pleasant and cooperative, no obvious depression or anxiety, speech and thought processing grossly intact  ASSESSMENT AND PLAN:  Discussed the following assessment and plan:  Pharyngitis, unspecified etiology  -viral vs bacterial -will start prednisone as concerned for tonsillar edema -pt given strict ED precautions for worsening symptoms - Plan: predniSONE (DELTASONE) 20 MG tablet, amoxicillin-clavulanate (AUGMENTIN) 875-125 MG tablet  Acute non-recurrent maxillary sinusitis -continue nasal saline rinse and other supportive care  - Plan: amoxicillin-clavulanate (AUGMENTIN) 875-125 MG tablet  Suspected Covid-19 Virus Infection -results pending.   -given strict precautions for worsening symptoms  F/u prn  I discussed the assessment and treatment plan with the patient. The patient was provided an opportunity to ask questions and all were answered. The patient agreed with the plan and demonstrated an understanding of the instructions.   The patient was advised to call back or seek an in-person evaluation if the symptoms worsen or if the condition fails to improve as anticipated.   Billie Ruddy, MD

## 2018-06-03 LAB — NOVEL CORONAVIRUS, NAA: SARS-CoV-2, NAA: NOT DETECTED

## 2018-06-05 ENCOUNTER — Telehealth: Payer: Self-pay | Admitting: *Deleted

## 2018-06-05 NOTE — Telephone Encounter (Signed)
-----   Message from Marcello Moores, RN sent at 06/03/2018  1:25 PM EDT ----- Pt. Given results, verbalizes understanding.

## 2018-06-05 NOTE — Telephone Encounter (Signed)
COVID-19 results were negative

## 2018-06-12 IMAGING — US US ABDOMEN LIMITED
1 series · 13 of 25 positions shown · non-contrast
Comparison: Abdominal ultrasound October 16, 2013

CLINICAL DATA: Chronic epigastric pain

EXAM:
ULTRASOUND ABDOMEN LIMITED RIGHT UPPER QUADRANT

[Series 1: us abdomen limited · 0.18mm/px · 13 of 51 slices shown]
[im 1/51]
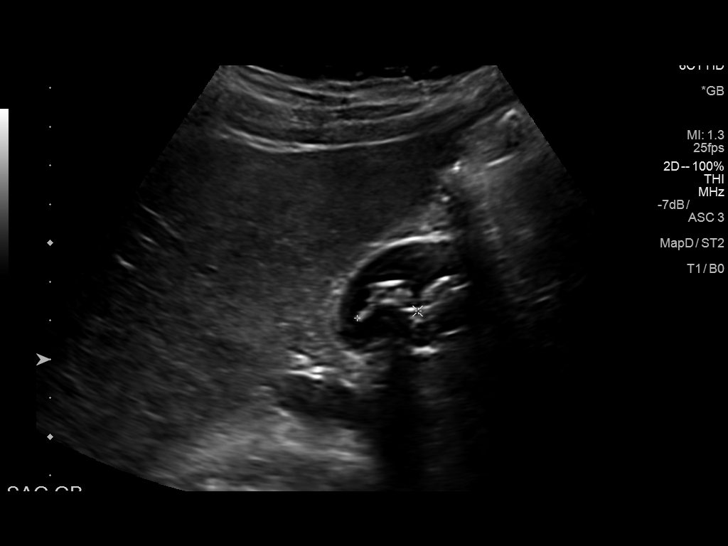
[im 5/51]
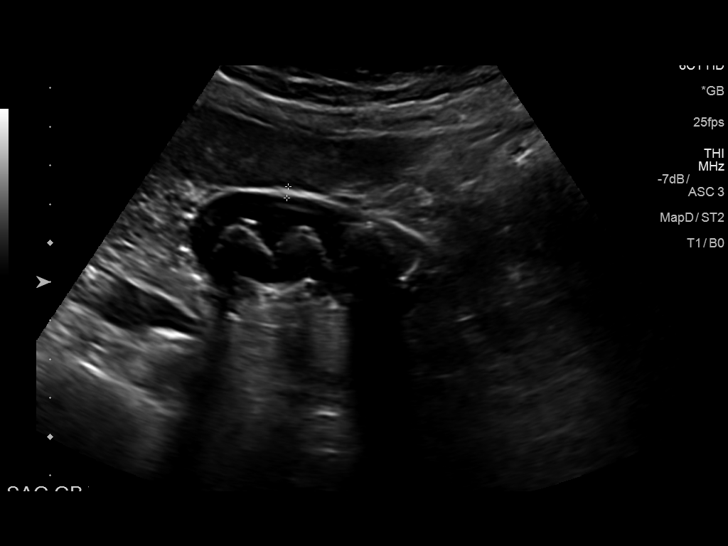
[im 9/51]
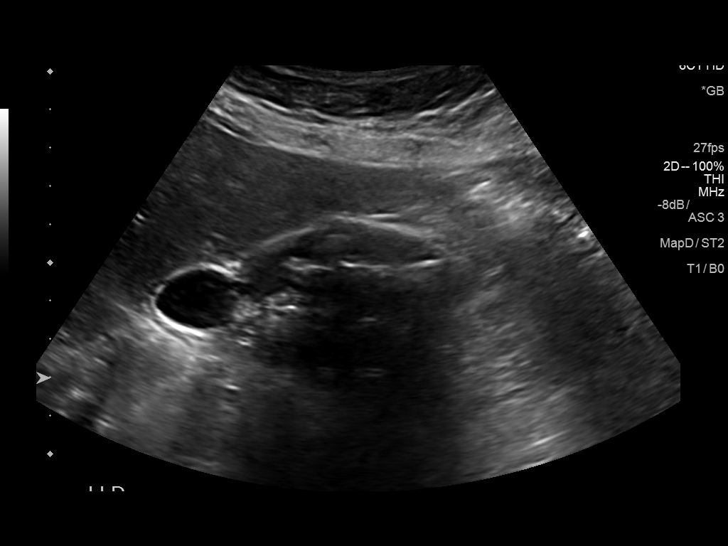
[im 13/51]
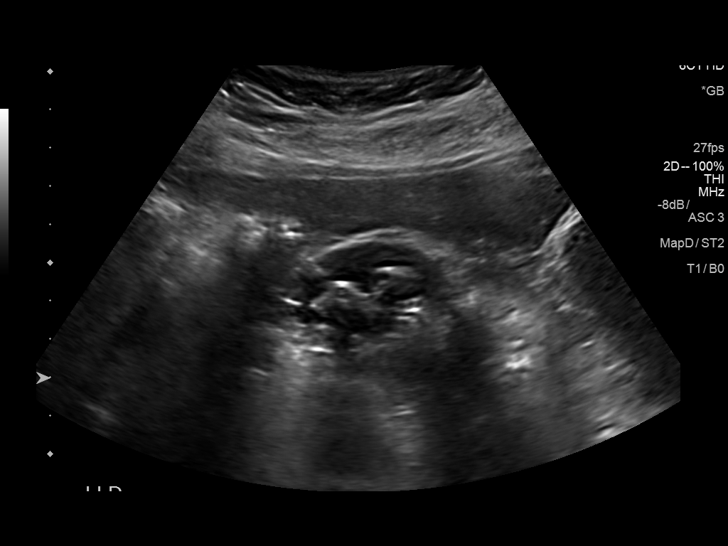
[im 17/51]
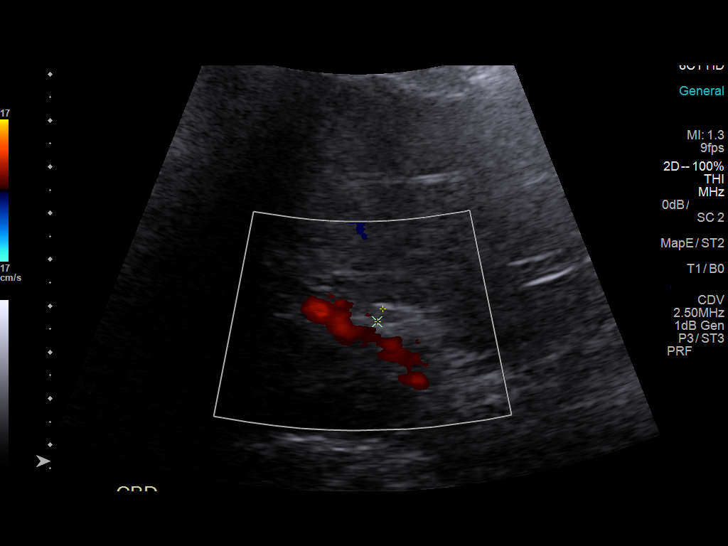
[im 21/51]
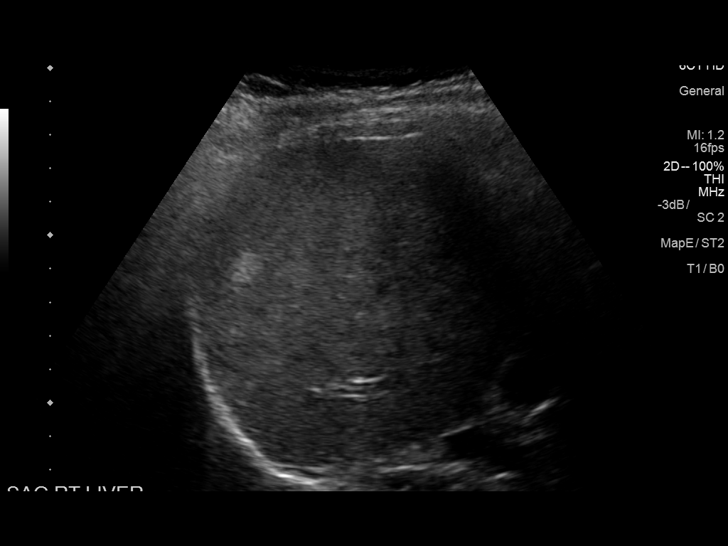
[im 26/51]
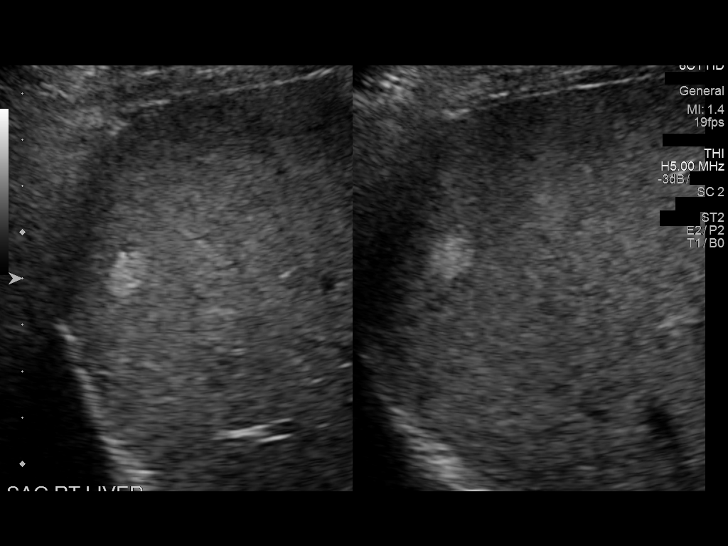
[im 30/51]
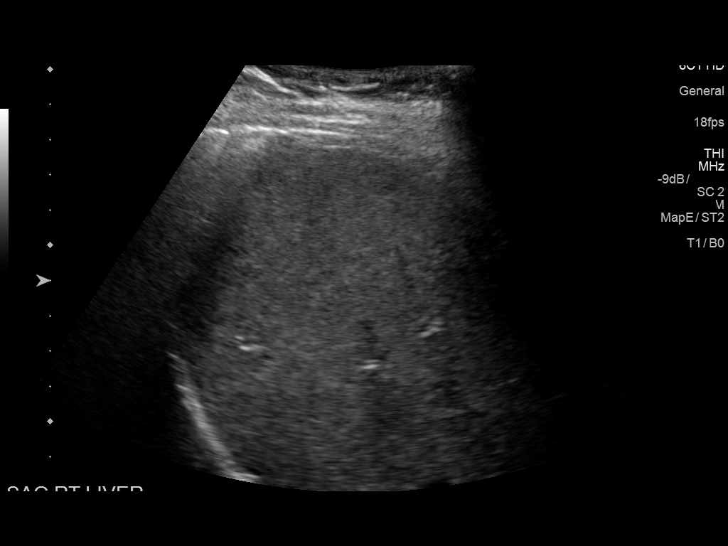
[im 34/51]
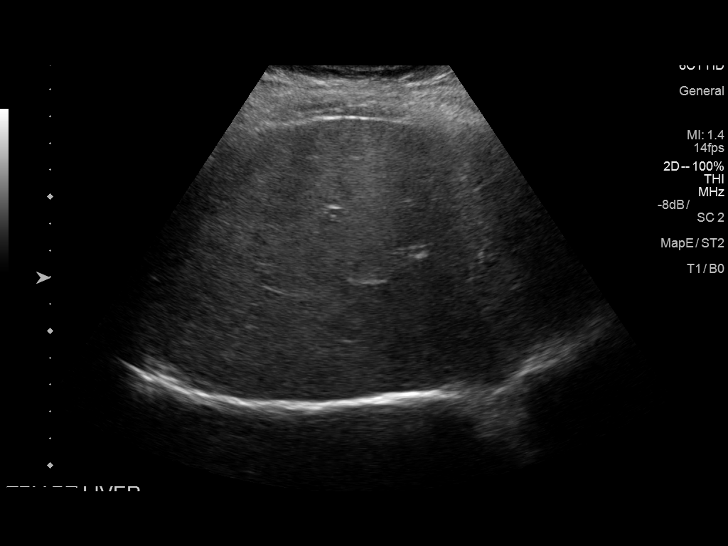
[im 38/51]
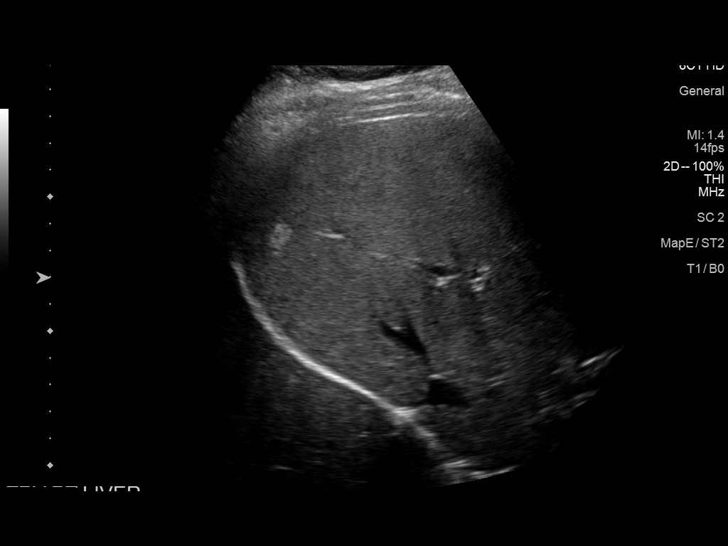
[im 42/51]
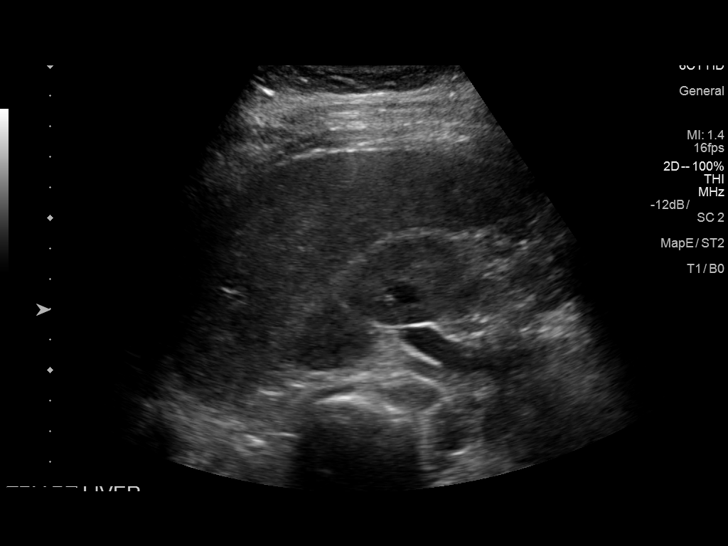
[im 46/51]
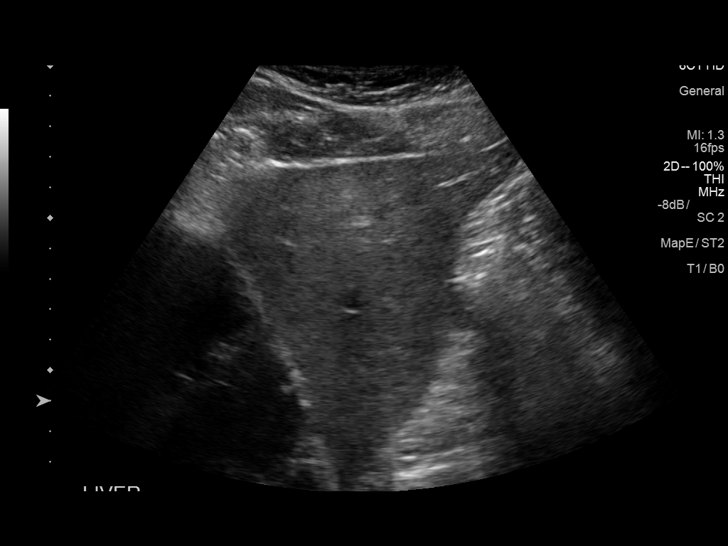
[im 51/51]
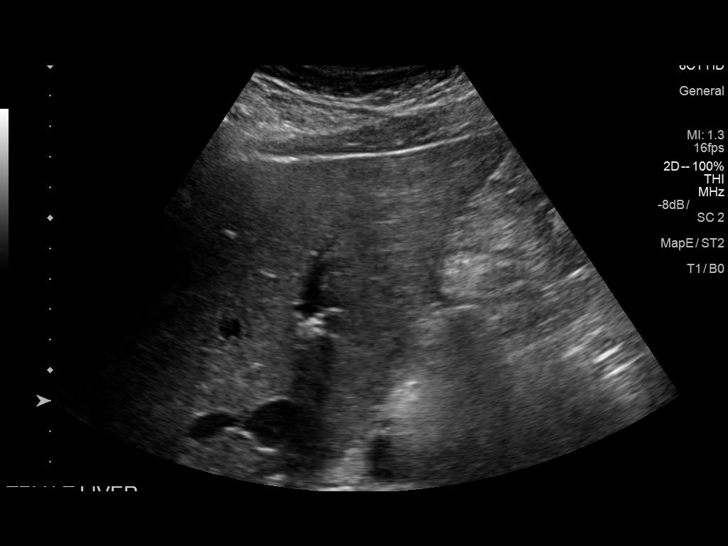

[13 of 25 positions shown; findings below may reference images not displayed]

FINDINGS: Gallbladder:

Within the gallbladder, there are multiple echogenic foci which move
and shadow consistent with cholelithiasis. Largest individual
gallstone measures 1.6 cm in length. There is no appreciable
gallbladder wall thickening. There is no pericholecystic fluid. No
sonographic Murphy sign noted by sonographer.

Common bile duct:

Diameter: 3 mm. No intrahepatic or extrahepatic biliary duct
dilatation.

Liver:

There is an echogenic focus in the anterior segment of the right
lobe of the liver measuring 0.8 x 1.0 x 1.0 cm. No other focal liver
lesion evident. Within normal limits in parenchymal echogenicity.
Portal vein is patent on color Doppler imaging with normal direction
of blood flow towards the liver.
IMPRESSION: 1. Cholelithiasis. No appreciable gallbladder wall thickening or
pericholecystic fluid.

2. Uniformly echogenic focus in the anterior segment right lobe of
the liver measuring 0.8 x 1.0 x 1.0 cm. Suspect hemangioma. As this
be prudent to consider a follow-up ultrasound of the liver in 1 year
to confirm stability. No other focal liver lesion evident. Overall
liver echogenicity within normal limits.

## 2018-08-07 DIAGNOSIS — J3089 Other allergic rhinitis: Secondary | ICD-10-CM | POA: Diagnosis not present

## 2018-08-07 DIAGNOSIS — J3081 Allergic rhinitis due to animal (cat) (dog) hair and dander: Secondary | ICD-10-CM | POA: Diagnosis not present

## 2018-08-07 DIAGNOSIS — L230 Allergic contact dermatitis due to metals: Secondary | ICD-10-CM | POA: Diagnosis not present

## 2018-08-07 DIAGNOSIS — J301 Allergic rhinitis due to pollen: Secondary | ICD-10-CM | POA: Diagnosis not present

## 2018-08-15 DIAGNOSIS — Z6837 Body mass index (BMI) 37.0-37.9, adult: Secondary | ICD-10-CM | POA: Diagnosis not present

## 2018-08-15 DIAGNOSIS — Z01419 Encounter for gynecological examination (general) (routine) without abnormal findings: Secondary | ICD-10-CM | POA: Diagnosis not present

## 2018-08-15 DIAGNOSIS — Z124 Encounter for screening for malignant neoplasm of cervix: Secondary | ICD-10-CM | POA: Diagnosis not present

## 2018-08-15 DIAGNOSIS — Z130 Encounter for screening for diseases of the blood and blood-forming organs and certain disorders involving the immune mechanism: Secondary | ICD-10-CM | POA: Diagnosis not present

## 2018-08-15 DIAGNOSIS — Z1231 Encounter for screening mammogram for malignant neoplasm of breast: Secondary | ICD-10-CM | POA: Diagnosis not present

## 2018-12-05 DIAGNOSIS — Z20828 Contact with and (suspected) exposure to other viral communicable diseases: Secondary | ICD-10-CM | POA: Diagnosis not present

## 2019-02-04 DIAGNOSIS — Z0000 Encounter for general adult medical examination without abnormal findings: Secondary | ICD-10-CM | POA: Diagnosis not present

## 2019-02-04 DIAGNOSIS — Z0189 Encounter for other specified special examinations: Secondary | ICD-10-CM | POA: Diagnosis not present

## 2019-03-07 DIAGNOSIS — M50322 Other cervical disc degeneration at C5-C6 level: Secondary | ICD-10-CM | POA: Diagnosis not present

## 2019-03-07 DIAGNOSIS — M2578 Osteophyte, vertebrae: Secondary | ICD-10-CM | POA: Diagnosis not present

## 2019-03-07 DIAGNOSIS — M47816 Spondylosis without myelopathy or radiculopathy, lumbar region: Secondary | ICD-10-CM | POA: Diagnosis not present

## 2019-03-07 DIAGNOSIS — M4802 Spinal stenosis, cervical region: Secondary | ICD-10-CM | POA: Diagnosis not present

## 2019-03-07 DIAGNOSIS — M47812 Spondylosis without myelopathy or radiculopathy, cervical region: Secondary | ICD-10-CM | POA: Diagnosis not present

## 2019-05-16 DIAGNOSIS — Z03818 Encounter for observation for suspected exposure to other biological agents ruled out: Secondary | ICD-10-CM | POA: Diagnosis not present

## 2019-07-01 ENCOUNTER — Ambulatory Visit: Payer: Self-pay | Admitting: Podiatrist

## 2019-07-01 ENCOUNTER — Other Ambulatory Visit: Payer: Self-pay

## 2019-07-01 ENCOUNTER — Encounter: Payer: Self-pay | Admitting: Podiatrist

## 2019-07-01 ENCOUNTER — Ambulatory Visit (INDEPENDENT_AMBULATORY_CARE_PROVIDER_SITE_OTHER): Payer: Self-pay

## 2019-07-01 VITALS — Temp 96.1°F

## 2019-07-01 DIAGNOSIS — M79671 Pain in right foot: Secondary | ICD-10-CM

## 2019-07-01 DIAGNOSIS — M7741 Metatarsalgia, right foot: Secondary | ICD-10-CM

## 2019-07-01 NOTE — Progress Notes (Signed)
    Chief Complaint  Patient presents with  . Foot Pain    R plantar midfoot x1 yr. Pt stated, "Walking causes the pain, and it increases the more I walk. It can be up to 10/10. Wearing a night splint has helped, and I don't have pain when I rest".  . Foot Orthotics    Pt stated, "I had therapy for my foot, and I was told that my R arch had fallen. I had orthotics, but I think I donated them".  . Foot Problem    R medial forefoot. ? bunion. Pt stated, "It sticks out. There's pressure and pain. It's hard to wear certain shoes".     HPI: Patient is 44 y.o. female who presents today for the concerns as listed above.  She relates a pulling and stabbing type of pain on the right foot near the ball of the foot.  Patient relates her foot is more comfortable in a shoe vs. Barefoot.    Review of Systems No fevers, chills, nausea, muscle aches, no difficulty breathing, no calf pain, no chest pain or shortness of breath.   Physical Exam  GENERAL APPEARANCE: Alert, conversant. Appropriately groomed. No acute distress.   VASCULAR: Pedal pulses palpable DP and PT bilateral.  Capillary refill time is immediate to all digits,  Proximal to distal cooling it warm to warm.  Digital perfusion adequate.   NEUROLOGIC: sensation is intact epicritically and protectively to 5.07 monofilament at 5/5 sites bilateral.  Light touch is intact bilateral, vibratory sensation intact bilateral, achilles tendon reflex is intact bilateral.   MUSCULOSKELETAL: acceptable muscle strength, tone and stability bilateral.  Mild bunion deformity is present No gross boney pedal deformities noted.  No pain, crepitus or limitation noted with foot and ankle range of motion bilateral.  Diffuse pain on palpation submetatarsal 2 area of the right foot.    DERMATOLOGIC: skin is warm, supple, and dry.  No open lesions noted.  No rash, no pre ulcerative lesions. Digital nails are asymptomatic.     xrays show a mild bunion deformity  bilateral, no acute osseous abnormalities are present, no dislocation seen.    Assessment     ICD-10-CM   1. Right foot pain  M79.671 DG Foot Complete Right  2. Metatarsalgia of right foot  M77.41      Plan  Discussed the positive benefits of orthotics and will have her set up for an orthotic  Measurement and casting at her convenience.

## 2019-07-01 NOTE — Patient Instructions (Signed)
We will have you see one of our orthotic specialists for your orthotics- they will measure and mold your feet and will get the devices ordered for you.

## 2019-07-08 ENCOUNTER — Other Ambulatory Visit: Payer: Self-pay

## 2019-07-08 ENCOUNTER — Ambulatory Visit (INDEPENDENT_AMBULATORY_CARE_PROVIDER_SITE_OTHER): Payer: Self-pay | Admitting: Orthotics

## 2019-07-08 DIAGNOSIS — M79671 Pain in right foot: Secondary | ICD-10-CM

## 2019-07-08 DIAGNOSIS — M7741 Metatarsalgia, right foot: Secondary | ICD-10-CM

## 2019-07-08 DIAGNOSIS — M79672 Pain in left foot: Secondary | ICD-10-CM

## 2019-07-08 NOTE — Progress Notes (Signed)
Patient came into today to be cast for Custom Foot Orthotics. Upon recommendation of Dr. Valentina Lucks Patient presents with foot pain, metatarsalgia Goals are offload forefoot Patient came into today to be cast for Custom [employer] Orthotics. Upon recommendation of Dr.  Patient presents with Goals are Plan vendor  Plan vendor

## 2019-07-29 ENCOUNTER — Other Ambulatory Visit: Payer: Self-pay

## 2019-07-29 ENCOUNTER — Encounter: Payer: BC Managed Care – PPO | Admitting: Orthotics

## 2019-07-30 ENCOUNTER — Other Ambulatory Visit: Payer: Self-pay | Admitting: Podiatrist

## 2019-07-30 DIAGNOSIS — M7741 Metatarsalgia, right foot: Secondary | ICD-10-CM

## 2019-08-08 DIAGNOSIS — J3089 Other allergic rhinitis: Secondary | ICD-10-CM | POA: Diagnosis not present

## 2019-08-08 DIAGNOSIS — J3081 Allergic rhinitis due to animal (cat) (dog) hair and dander: Secondary | ICD-10-CM | POA: Diagnosis not present

## 2019-08-08 DIAGNOSIS — J301 Allergic rhinitis due to pollen: Secondary | ICD-10-CM | POA: Diagnosis not present

## 2019-08-08 DIAGNOSIS — L230 Allergic contact dermatitis due to metals: Secondary | ICD-10-CM | POA: Diagnosis not present

## 2019-09-17 DIAGNOSIS — H5203 Hypermetropia, bilateral: Secondary | ICD-10-CM | POA: Diagnosis not present

## 2020-01-20 DIAGNOSIS — Z130 Encounter for screening for diseases of the blood and blood-forming organs and certain disorders involving the immune mechanism: Secondary | ICD-10-CM | POA: Diagnosis not present

## 2020-01-20 DIAGNOSIS — Z124 Encounter for screening for malignant neoplasm of cervix: Secondary | ICD-10-CM | POA: Diagnosis not present

## 2020-01-20 DIAGNOSIS — Z1389 Encounter for screening for other disorder: Secondary | ICD-10-CM | POA: Diagnosis not present

## 2020-01-20 DIAGNOSIS — Z01419 Encounter for gynecological examination (general) (routine) without abnormal findings: Secondary | ICD-10-CM | POA: Diagnosis not present

## 2020-01-20 DIAGNOSIS — Z1231 Encounter for screening mammogram for malignant neoplasm of breast: Secondary | ICD-10-CM | POA: Diagnosis not present

## 2020-01-21 DIAGNOSIS — Z1152 Encounter for screening for COVID-19: Secondary | ICD-10-CM | POA: Diagnosis not present

## 2020-02-05 ENCOUNTER — Encounter: Payer: Self-pay | Admitting: Family Medicine

## 2020-02-05 ENCOUNTER — Telehealth (INDEPENDENT_AMBULATORY_CARE_PROVIDER_SITE_OTHER): Payer: BC Managed Care – PPO | Admitting: Family Medicine

## 2020-02-05 DIAGNOSIS — J018 Other acute sinusitis: Secondary | ICD-10-CM

## 2020-02-05 MED ORDER — FLUTICASONE PROPIONATE 50 MCG/ACT NA SUSP: 1.0000 | Freq: Every day | NASAL | 0 refills | Status: AC

## 2020-02-05 MED ORDER — AMOXICILLIN-POT CLAVULANATE 500-125 MG PO TABS: 1.0000 | ORAL_TABLET | Freq: Two times a day (BID) | ORAL | 0 refills | Status: AC

## 2020-02-05 NOTE — Progress Notes (Signed)
Virtual Visit via Video Note  I connected with [name] on 02/05/20 at  2:30 PM EST by a video enabled telemedicine application 2/2 [id number] pandemic and verified that I am speaking with the correct person using two identifiers.  Location patient: home Location provider:work or home office Persons participating in the virtual visit: patient, provider  I discussed the limitations of evaluation and management by telemedicine and the availability of in person appointments. The patient expressed understanding and agreed to proceed.   HPI: Pt is a 45 yo female with pmh sig for asthma, allergies, GERD, h/o depression, pre DM, liver hemangioma who was seen for ongoing concern.  Pt notes sinus drainage x 2 wks.  Now developing a cough and L ear pain.  Had some SOB, but improved with inhaler use.  Had some streaks of blood in mucus.  Pt denies sore throat, fever, .  Pt had a sharp pain under L breast x a few days.  Feeling has improved today.  Pt's husband was sick last wk, but COVID testing was negative.  Pt had 2 dose Moderna COVID vaccine over the summer.  ROS: See pertinent positives and negatives per HPI.  Past Medical History:  Diagnosis Date  . Allergy   . Anal fissure   . Arthritis   . Asthma   . Blood in stool   . Cholelithiasis   . Depression   . GERD (gastroesophageal reflux disease)   . Hypertension   . Liver hemangioma   . Pneumonia    as a child  . Pre-diabetes   . SVD (spontaneous vaginal delivery) 07/15/2016  . Ulcer     Past Surgical History:  Procedure Laterality Date  . CERVICAL DISCECTOMY  03-2008  . CHOLECYSTECTOMY N/A 04/21/2017   Procedure: LAPAROSCOPIC CHOLECYSTECTOMY;  Surgeon: Clovis Riley, MD;  Location: [place];  Service: General;  Laterality: N/A;  . COLONOSCOPY  2010  . WISDOM TOOTH EXTRACTION      Family History  Problem Relation Age of Onset  . Breast cancer Maternal Grandmother   . Ovarian cancer Mother   . Diabetes Paternal Grandmother    . Hypertension Father   . Seizures Father   . Colon cancer Neg Hx   . Esophageal cancer Neg Hx   . Stomach cancer Neg Hx   . Kidney disease Neg Hx   . Liver disease Neg Hx      Current Outpatient Medications:  .  albuterol (PROVENTIL HFA;VENTOLIN HFA) 108 (90 Base) MCG/ACT inhaler, Inhale 1-2 puffs into the lungs every 6 (six) hours as needed for wheezing or shortness of breath., Disp: , Rfl:  .  B Complex-C (SUPER B COMPLEX PO), Take 1 capsule by mouth daily., Disp: , Rfl:  .  budesonide-formoterol (SYMBICORT) 80-4.5 MCG/ACT inhaler, Take 2 puffs first thing in am and then another 2 puffs about 12 hours later., Disp: 1 Inhaler, Rfl: 11 .  diclofenac sodium (VOLTAREN) 1 % GEL, Apply 2-4 g topically 4 (four) times daily as needed (for pain.). , Disp: , Rfl:  .  gabapentin (NEURONTIN) 600 MG tablet, Take 600 mg by mouth 2 (two) times daily as needed (for pain.). , Disp: , Rfl:  .  montelukast (SINGULAIR) 10 MG tablet, Take 1 tablet (10 mg total) by mouth daily., Disp: 30 tablet, Rfl: 2 .  pantoprazole (PROTONIX) 40 MG tablet, TAKE 1 TABLET (40 MG TOTAL) BY MOUTH DAILY. TAKE 30-60 MIN BEFORE FIRST MEAL OF THE DAY, Disp: 30 tablet, Rfl: 2 .  triamcinolone ointment (KENALOG) 0.1 %, Apply 1 application topically daily. , Disp: , Rfl:   EXAM:  VITALS per patient if applicable:  RR between 12-20 bom  GENERAL: alert, oriented, appears well and in no acute distress  HEENT: atraumatic, conjunctiva clear, no obvious abnormalities on inspection of external nose and ears  NECK: normal movements of the head and neck  LUNGS: on inspection no signs of respiratory distress, breathing rate appears normal, no obvious gross SOB, gasping or wheezing  CV: no obvious cyanosis  MS: moves all visible extremities without noticeable abnormality  PSYCH/NEURO: pleasant and cooperative, no obvious depression or anxiety, speech and thought processing grossly intact  ASSESSMENT AND PLAN:  Discussed the  following assessment and plan:  Other subacute sinusitis  -supportive care -for continued symptoms consider COVID testing - Plan: amoxicillin-clavulanate (AUGMENTIN) 500-125 MG tablet, fluticasone (FLONASE) 50 MCG/ACT nasal spray  F/u prn   I discussed the assessment and treatment plan with the patient. The patient was provided an opportunity to ask questions and all were answered. The patient agreed with the plan and demonstrated an understanding of the instructions.   The patient was advised to call back or seek an in-person evaluation if the symptoms worsen or if the condition fails to improve as anticipated.   Billie Ruddy, MD

## 2020-08-19 DIAGNOSIS — J3089 Other allergic rhinitis: Secondary | ICD-10-CM | POA: Diagnosis not present

## 2020-08-19 DIAGNOSIS — J3081 Allergic rhinitis due to animal (cat) (dog) hair and dander: Secondary | ICD-10-CM | POA: Diagnosis not present

## 2020-08-19 DIAGNOSIS — L230 Allergic contact dermatitis due to metals: Secondary | ICD-10-CM | POA: Diagnosis not present

## 2020-08-19 DIAGNOSIS — J301 Allergic rhinitis due to pollen: Secondary | ICD-10-CM | POA: Diagnosis not present

## 2020-08-31 ENCOUNTER — Ambulatory Visit: Payer: BC Managed Care – PPO | Admitting: Family Medicine

## 2020-09-03 ENCOUNTER — Ambulatory Visit: Payer: BC Managed Care – PPO | Admitting: Family Medicine

## 2020-10-14 DIAGNOSIS — H02883 Meibomian gland dysfunction of right eye, unspecified eyelid: Secondary | ICD-10-CM | POA: Diagnosis not present

## 2020-10-14 DIAGNOSIS — H524 Presbyopia: Secondary | ICD-10-CM | POA: Diagnosis not present

## 2020-10-14 DIAGNOSIS — H02886 Meibomian gland dysfunction of left eye, unspecified eyelid: Secondary | ICD-10-CM | POA: Diagnosis not present

## 2020-10-14 DIAGNOSIS — H5203 Hypermetropia, bilateral: Secondary | ICD-10-CM | POA: Diagnosis not present

## 2020-10-28 DIAGNOSIS — H524 Presbyopia: Secondary | ICD-10-CM | POA: Diagnosis not present

## 2020-11-09 DIAGNOSIS — N912 Amenorrhea, unspecified: Secondary | ICD-10-CM | POA: Diagnosis not present

## 2021-02-25 DIAGNOSIS — M542 Cervicalgia: Secondary | ICD-10-CM | POA: Diagnosis not present

## 2021-02-25 DIAGNOSIS — M549 Dorsalgia, unspecified: Secondary | ICD-10-CM | POA: Diagnosis not present

## 2021-02-25 DIAGNOSIS — R7303 Prediabetes: Secondary | ICD-10-CM | POA: Diagnosis not present

## 2021-04-29 ENCOUNTER — Ambulatory Visit: Payer: BC Managed Care – PPO | Admitting: Podiatry

## 2021-05-03 ENCOUNTER — Ambulatory Visit: Payer: BC Managed Care – PPO | Admitting: Podiatry

## 2021-05-03 ENCOUNTER — Ambulatory Visit (INDEPENDENT_AMBULATORY_CARE_PROVIDER_SITE_OTHER): Payer: BC Managed Care – PPO

## 2021-05-03 ENCOUNTER — Encounter: Payer: Self-pay | Admitting: Podiatry

## 2021-05-03 DIAGNOSIS — M7672 Peroneal tendinitis, left leg: Secondary | ICD-10-CM

## 2021-05-03 DIAGNOSIS — S99922A Unspecified injury of left foot, initial encounter: Secondary | ICD-10-CM

## 2021-05-03 MED ORDER — TRIAMCINOLONE ACETONIDE 10 MG/ML IJ SUSP
10.0000 mg | Freq: Once | INTRAMUSCULAR | Status: AC
  Administered 2021-05-03: 10 mg

## 2021-05-04 DIAGNOSIS — H5712 Ocular pain, left eye: Secondary | ICD-10-CM | POA: Diagnosis not present

## 2021-05-05 NOTE — Progress Notes (Signed)
Subjective:  ? ?Patient ID: [name], female   DOB: 46 y.o.   MRN: [id number]  ? ?HPI ?Patient presents stating she is having a lot of pain in the outside of her left foot and states that its been sore and making it hard to walk.  Does not remember specific injury ? ? ?ROS ? ? ?   ?Objective:  ?Physical Exam  ?Neurovascular status intact with inflammation of the lateral side left foot which has come up over the more recent period of time with a lot of tenderness associated with it ? ?   ?Assessment:  ?Appears to be acute peroneal tendinitis left cannot rule out bony injury ? ?   ?Plan:  ?H&P x-ray reviewed today I went ahead did sterile prep and injected the sheath peroneal tendon after explaining chances for rupture 3 mg dexamethasone Kenalog 5 mg Xylocaine advised on ice therapy support therapy and reappoint if symptoms indicate ? ?X-rays were negative for signs that there is bone injury or bone pathology associated with this condition ?   ? ? ?

## 2021-05-10 ENCOUNTER — Other Ambulatory Visit: Payer: Self-pay | Admitting: Podiatry

## 2021-05-10 DIAGNOSIS — M7672 Peroneal tendinitis, left leg: Secondary | ICD-10-CM

## 2021-08-14 ENCOUNTER — Other Ambulatory Visit: Payer: Self-pay

## 2021-08-14 ENCOUNTER — Emergency Department (HOSPITAL_COMMUNITY)
Admission: EM | Admit: 2021-08-14 | Discharge: 2021-08-15 | Disposition: A | Discharge status: Home / Self Care | Payer: BC Managed Care – PPO | Attending: Emergency Medicine | Admitting: Emergency Medicine

## 2021-08-14 ENCOUNTER — Encounter (HOSPITAL_COMMUNITY): Payer: Self-pay | Admitting: Emergency Medicine

## 2021-08-14 DIAGNOSIS — S79922A Unspecified injury of left thigh, initial encounter: Secondary | ICD-10-CM | POA: Diagnosis not present

## 2021-08-14 DIAGNOSIS — Z23 Encounter for immunization: Secondary | ICD-10-CM | POA: Diagnosis not present

## 2021-08-14 DIAGNOSIS — S81851A Open bite, right lower leg, initial encounter: Secondary | ICD-10-CM | POA: Diagnosis not present

## 2021-08-14 DIAGNOSIS — S71132A Puncture wound without foreign body, left thigh, initial encounter: Secondary | ICD-10-CM | POA: Insufficient documentation

## 2021-08-14 DIAGNOSIS — W540XXA Bitten by dog, initial encounter: Secondary | ICD-10-CM | POA: Insufficient documentation

## 2021-08-14 DIAGNOSIS — Y92009 Unspecified place in unspecified non-institutional (private) residence as the place of occurrence of the external cause: Secondary | ICD-10-CM | POA: Insufficient documentation

## 2021-08-14 DIAGNOSIS — Y990 Civilian activity done for income or pay: Secondary | ICD-10-CM | POA: Diagnosis not present

## 2021-08-14 MED ORDER — AMOXICILLIN-POT CLAVULANATE 875-125 MG PO TABS: 1.0000 | ORAL_TABLET | Freq: Two times a day (BID) | ORAL | 0 refills | Status: DC

## 2021-08-14 MED ORDER — AMOXICILLIN-POT CLAVULANATE 875-125 MG PO TABS
1.0000 | ORAL_TABLET | Freq: Once | ORAL | Status: AC
  Administered 2021-08-15: 1 via ORAL
  Filled 2021-08-14: qty 1

## 2021-08-14 MED ORDER — TETANUS-DIPHTH-ACELL PERTUSSIS 5-2.5-18.5 LF-MCG/0.5 IM SUSY
0.5000 mL | PREFILLED_SYRINGE | Freq: Once | INTRAMUSCULAR | Status: AC
  Administered 2021-08-15: 0.5 mL via INTRAMUSCULAR
  Filled 2021-08-14: qty 0.5

## 2021-08-14 NOTE — ED Provider Notes (Signed)
[facility] EMERGENCY DEPARTMENT Provider Note   CSN: [id number] Arrival date & time: 08/14/21  2048     History  Chief Complaint  Patient presents with   Animal Bite    [name] is a 46 y.o. female.  46 y/o female presents to the emergency department for evaluation of dog bite.  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  Incident occurred at approximately 1745.  Patient obtained the dog's vaccination records and noted the dog to be up-to-date on its rabies vaccine.  She is unaware of her last tetanus shot.  No medications taken prior to arrival.  The history is provided by the patient. No language interpreter was used.  Animal Bite      Home Medications Prior to Admission medications   Medication Sig Start Date End Date Taking? Authorizing Provider  amoxicillin-clavulanate (AUGMENTIN) 875-125 MG tablet Take 1 tablet by mouth every 12 (twelve) hours. 08/14/21  Yes Antonietta Breach, PA-C  albuterol (PROVENTIL HFA;VENTOLIN HFA) 108 (90 Base) MCG/ACT inhaler Inhale 1-2 puffs into the lungs every 6 (six) hours as needed for wheezing or shortness of breath.    [provider]  B Complex-C (SUPER B COMPLEX PO) Take 1 capsule by mouth daily.    [provider]  budesonide-formoterol (SYMBICORT) 80-4.5 MCG/ACT inhaler Take 2 puffs first thing in am and then another 2 puffs about 12 hours later. 10/25/17   Tanda Rockers, MD  diclofenac sodium (VOLTAREN) 1 % GEL Apply 2-4 g topically 4 (four) times daily as needed (for pain.).     [provider]  fluticasone (FLONASE) 50 MCG/ACT nasal spray Place 1 spray into both nostrils daily. 02/05/20   Billie Ruddy, MD  gabapentin (NEURONTIN) 600 MG tablet Take 600 mg by mouth 2 (two) times daily as needed (for pain.).     [provider]  montelukast (SINGULAIR) 10 MG tablet Take 1 tablet (10 mg total) by mouth daily. 09/25/17   Tanda Rockers, MD   pantoprazole (PROTONIX) 40 MG tablet TAKE 1 TABLET (40 MG TOTAL) BY MOUTH DAILY. TAKE 30-60 MIN BEFORE FIRST MEAL OF THE DAY 02/26/18   Tanda Rockers, MD  triamcinolone ointment (KENALOG) 0.1 % Apply 1 application topically daily.     [provider]      Allergies    Hydrocodone, Acetaminophen, Guaifenesin, Pseudoephedrine, and Shellfish allergy    Review of Systems   Review of Systems Ten systems reviewed and are negative for acute change, except as noted in the HPI.    Physical Exam Updated Vital Signs BP (!) 138/94 (BP Location: Left Arm)   Pulse 83   Temp 98.5 F (36.9 C) (Oral)   Resp 18   SpO2 97%   Physical Exam Vitals and nursing note reviewed.  Constitutional:      General: She is not in acute distress.    Appearance: She is well-developed. She is not diaphoretic.     Comments: Nontoxic appearing and in NAD  HENT:     Head: Normocephalic and atraumatic.  Eyes:     General: No scleral icterus.    Conjunctiva/sclera: Conjunctivae normal.  Cardiovascular:     Rate and Rhythm: Normal rate and regular rhythm.     Pulses: Normal pulses.  Pulmonary:     Effort: Pulmonary effort is normal. No respiratory distress.     Comments: Respirations even and unlabored Musculoskeletal:  General: Normal range of motion.     Cervical back: Normal range of motion.  Skin:    General: Skin is warm and dry.     Coloration: Skin is not pale.     Findings: No rash.     Comments: Puncture wound to posterior L thigh with associated abrasion. No active bleeding.  Neurological:     Mental Status: She is alert and oriented to person, place, and time.     Coordination: Coordination normal.  Psychiatric:        Behavior: Behavior normal.     ED Results / Procedures / Treatments   Labs (all labs ordered are listed, but only abnormal results are displayed) Labs Reviewed - No data to display  EKG None  Radiology No results found.  Procedures Procedures     Medications Ordered in ED Medications  Tdap (BOOSTRIX) injection 0.5 mL (has no administration in time range)  amoxicillin-clavulanate (AUGMENTIN) 875-125 MG per tablet 1 tablet (has no administration in time range)    ED Course/ Medical Decision Making/ A&P                           Medical Decision Making Risk Prescription drug management.   This patient presents to the ED for concern of dog bite, this involves an extensive number of treatment options, and is a complaint that carries with it a high risk of complications and morbidity.  The differential diagnosis includes laceration vs abrasion vs cellulitis vs abscess vs tendon injury vs bony injury or deformity   Co morbidities that complicate the patient evaluation  Obesity    Medicines ordered and prescription drug management:  I ordered medication including Augmentin for animal bite  Reevaluation of the patient after these medicines showed that the patient stayed the same I have reviewed the patients home medicines and have made adjustments as needed   Problem List / ED Course:  Dog bite w/o need for wound approximation or repair.  Return precautions discussed Tdap updated and course of Augmentin started prior to discharge   Reevaluation:  After the interventions noted above, I reevaluated the patient and found that they have :improved   Social Determinants of Health:  Insured patient    Dispostion:  After consideration of the diagnostic results and the patients response to treatment, I feel that the patent would benefit from discharge on course of Augmentin. Able to follow up with PCP for wound recheck PRN.          Final Clinical Impression(s) / ED Diagnoses Final diagnoses:  Dog bite, initial encounter    Rx / DC Orders ED Discharge Orders          Ordered    amoxicillin-clavulanate (AUGMENTIN) 875-125 MG tablet  Every 12 hours        08/14/21 2353              Antonietta Breach,  PA-C 08/15/21 0009    Fatima Blank, MD 08/16/21 [phone]

## 2021-08-14 NOTE — Discharge Instructions (Signed)
Take Augmentin as prescribed until finished. Use tylenol or ibuprofen for management of pain or soreness. Return for new or concerning symptoms.

## 2021-08-14 NOTE — ED Triage Notes (Signed)
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  Happened about 545 tonight.  Pt states owners were present and she has record of rabies vaccination  Unknown last tetanus.

## 2021-08-14 NOTE — ED Provider Notes (Incomplete)
[facility] EMERGENCY DEPARTMENT Provider Note   CSN: [id number] Arrival date & time: 08/14/21  2048     History {Add pertinent medical, surgical, social history, OB history to HPI:1} Chief Complaint  Patient presents with  . Animal Bite    [name] is a 46 y.o. female.  The history is provided by the patient. No language interpreter was used.  Animal Bite      Home Medications Prior to Admission medications   Medication Sig Start Date End Date Taking? Authorizing Provider  amoxicillin-clavulanate (AUGMENTIN) 875-125 MG tablet Take 1 tablet by mouth every 12 (twelve) hours. 08/14/21  Yes Antonietta Breach, PA-C  albuterol (PROVENTIL HFA;VENTOLIN HFA) 108 (90 Base) MCG/ACT inhaler Inhale 1-2 puffs into the lungs every 6 (six) hours as needed for wheezing or shortness of breath.    [provider]  B Complex-C (SUPER B COMPLEX PO) Take 1 capsule by mouth daily.    [provider]  budesonide-formoterol (SYMBICORT) 80-4.5 MCG/ACT inhaler Take 2 puffs first thing in am and then another 2 puffs about 12 hours later. 10/25/17   Tanda Rockers, MD  diclofenac sodium (VOLTAREN) 1 % GEL Apply 2-4 g topically 4 (four) times daily as needed (for pain.).     [provider]  fluticasone (FLONASE) 50 MCG/ACT nasal spray Place 1 spray into both nostrils daily. 02/05/20   Billie Ruddy, MD  gabapentin (NEURONTIN) 600 MG tablet Take 600 mg by mouth 2 (two) times daily as needed (for pain.).     [provider]  montelukast (SINGULAIR) 10 MG tablet Take 1 tablet (10 mg total) by mouth daily. 09/25/17   Tanda Rockers, MD  pantoprazole (PROTONIX) 40 MG tablet TAKE 1 TABLET (40 MG TOTAL) BY MOUTH DAILY. TAKE 30-60 MIN BEFORE FIRST MEAL OF THE DAY 02/26/18   Tanda Rockers, MD  triamcinolone ointment (KENALOG) 0.1 % Apply 1 application topically daily.     [provider]      Allergies    Hydrocodone, Acetaminophen, Guaifenesin,  Pseudoephedrine, and Shellfish allergy    Review of Systems   Review of Systems Ten systems reviewed and are negative for acute change, except as noted in the HPI.    Physical Exam Updated Vital Signs BP (!) 138/94 (BP Location: Left Arm)   Pulse 83   Temp 98.5 F (36.9 C) (Oral)   Resp 18   SpO2 97%   Physical Exam Vitals and nursing note reviewed.  Constitutional:      General: She is not in acute distress.    Appearance: She is well-developed. She is not diaphoretic.  HENT:     Head: Normocephalic and atraumatic.  Eyes:     General: No scleral icterus.    Conjunctiva/sclera: Conjunctivae normal.  Pulmonary:     Effort: Pulmonary effort is normal. No respiratory distress.  Musculoskeletal:        General: Normal range of motion.     Cervical back: Normal range of motion.  Skin:    General: Skin is warm and dry.     Coloration: Skin is not pale.     Findings: No rash.  Neurological:     Mental Status: She is alert and oriented to person, place, and time.  Psychiatric:        Behavior: Behavior normal.     ED Results / Procedures / Treatments   Labs (all labs ordered are listed, but only abnormal results are displayed) Labs Reviewed -  No data to display  EKG None  Radiology No results found.  Procedures Procedures  {Document cardiac monitor, telemetry assessment procedure when appropriate:1}  Medications Ordered in ED Medications  Tdap (BOOSTRIX) injection 0.5 mL (has no administration in time range)  amoxicillin-clavulanate (AUGMENTIN) 875-125 MG per tablet 1 tablet (has no administration in time range)    ED Course/ Medical Decision Making/ A&P                           Medical Decision Making Risk Prescription drug management.   ***  {Document critical care time when appropriate:1} {Document review of labs and clinical decision tools ie heart score, Chads2Vasc2 etc:1}  {Document your independent review of radiology images, and any outside  records:1} {Document your discussion with family members, caretakers, and with consultants:1} {Document social determinants of health affecting pt's care:1} {Document your decision making why or why not admission, treatments were needed:1} Final Clinical Impression(s) / ED Diagnoses Final diagnoses:  Dog bite, initial encounter    Rx / DC Orders ED Discharge Orders          Ordered    amoxicillin-clavulanate (AUGMENTIN) 875-125 MG tablet  Every 12 hours        08/14/21 2353

## 2021-08-15 ENCOUNTER — Encounter (HOSPITAL_COMMUNITY): Payer: Self-pay | Admitting: Emergency Medicine

## 2021-08-18 ENCOUNTER — Encounter: Payer: Self-pay | Admitting: Family Medicine

## 2021-08-18 ENCOUNTER — Ambulatory Visit: Payer: BC Managed Care – PPO | Admitting: Family Medicine

## 2021-08-18 VITALS — BP 124/96 | HR 85 | Temp 98.4°F | Wt 227.6 lb

## 2021-08-18 DIAGNOSIS — M79605 Pain in left leg: Secondary | ICD-10-CM

## 2021-08-18 DIAGNOSIS — R202 Paresthesia of skin: Secondary | ICD-10-CM | POA: Diagnosis not present

## 2021-08-18 DIAGNOSIS — R030 Elevated blood-pressure reading, without diagnosis of hypertension: Secondary | ICD-10-CM

## 2021-08-18 DIAGNOSIS — W540XXD Bitten by dog, subsequent encounter: Secondary | ICD-10-CM

## 2021-08-18 MED ORDER — DOXYCYCLINE HYCLATE 100 MG PO TABS: 100.0000 mg | ORAL_TABLET | Freq: Two times a day (BID) | ORAL | 0 refills | Status: AC

## 2021-08-18 NOTE — Progress Notes (Signed)
Subjective:    Patient ID: [name], female    DOB: 1975-08-06, 46 y.o.   MRN: [id number]  Chief Complaint  Patient presents with   Follow-up    Dog bite is still swollen, and painful. On back of left thigh    HPI Patient was seen today for follow-up on dog bite.  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  Dog was up-to-date on vaccines.  Given Tdap and Rx for Augmentin in ED.  Pt states area is still tender, swollen.  Denies drainage, fever, chills, nausea, vomiting.  Drinking ginger Kambucha to prevent GI side effects from abx..  Pt states bp is not typically elevated.  Did not drink any water today.  Pt states paresthesias in 3rd and 4th digits of R hand have become constant.   Pt wearing a wrist support wrap.  Pt had EMG/NCS on L wrist which was negative.  Pt had surgery on neck for cervical spinal stenosis causing symptoms.  Pt also followed by the [facility].   Past Medical History:  Diagnosis Date   Allergy    Anal fissure    Arthritis    Asthma    Blood in stool    Cholelithiasis    Depression    GERD (gastroesophageal reflux disease)    Hypertension    Liver hemangioma    Pneumonia    as a child   Pre-diabetes    SVD (spontaneous vaginal delivery) 07/15/2016   Ulcer     Allergies  Allergen Reactions   Hydrocodone Itching   Acetaminophen Hives and Itching   Guaifenesin Hives and Itching   Pseudoephedrine Hives, Itching and Other (See Comments)    Burning   Shellfish Allergy Hives and Itching    ROS General: Denies fever, chills, night sweats, changes in weight, changes in appetite HEENT: Denies headaches, ear pain, changes in vision, rhinorrhea, sore throat CV: Denies CP, palpitations, SOB, orthopnea Pulm: Denies SOB, cough, wheezing GI: Denies abdominal pain, nausea, vomiting, diarrhea, constipation GU: Denies dysuria, hematuria, frequency, vaginal discharge Msk: Denies muscle cramps, joint pains  + right  wrist pain Neuro: Denies weakness, numbness, tingling   + numbness and tingling in right third and fourth digits Skin: Denies rashes, bruising  + ecchymosis and erythema at the site of dog bite on posterior left thigh Psych: Denies depression, anxiety, hallucinations     Objective:    Blood pressure (!) 124/96, pulse 85, temperature 98.4 F (36.9 C), temperature source Oral, weight 227 lb 9.6 oz (103.2 kg), SpO2 98 %.  Gen. Pleasant, well-nourished, in no distress, normal affect   HEENT: [place]/AT, face symmetric, conjunctiva clear, no scleral icterus, PERRLA, EOMI, nares patent without drainage Lungs: no accessory muscle use, CTAB, no wheezes or rales Cardiovascular: RRR, no m/r/g, no peripheral edema Musculoskeletal: Wearing wrist brace.  Positive Tinel's.  No deformities, no cyanosis or clubbing, normal tone Neuro:  A&Ox3, CN II-XII intact, normal gait Skin:  Warm, dry.  Posterior left middle thigh with circular area of ecchymosis with 2 healing puncture wounds centrally located.  No induration or drainage.  Mildly increased warmth.  BP Readings from Last 3 Encounters:  08/18/21 (!) 124/96  08/15/21 119/84  03/08/18 118/80     Wt Readings from Last 3 Encounters:  08/18/21 227 lb 9.6 oz (103.2 kg)  08/15/21 227 lb 1.2 oz (103 kg)  03/08/18 225 lb (102.1 kg)    Lab Results  Component Value Date  WBC 6.5 10/25/2017   HGB 11.9 (L) 10/25/2017   HCT 35.6 (L) 10/25/2017   PLT 328.0 10/25/2017   GLUCOSE 101 (H) 04/13/2017   CHOL 208 (H) 07/05/2011   TRIG 83.0 07/05/2011   HDL 55.70 07/05/2011   LDLDIRECT 131.2 07/05/2011   ALT 40 04/13/2017   AST 22 04/13/2017   NA 138 04/13/2017   K 4.2 04/13/2017   CL 105 04/13/2017   CREATININE 0.88 04/13/2017   BUN 5 (L) 04/13/2017   CO2 24 04/13/2017   TSH 2.02 07/05/2011   HGBA1C 5.6 04/13/2017    Assessment/Plan:  Dog bite, subsequent encounter  - Plan: doxycycline (VIBRA-TABS) 100 MG tablet  Left leg pain -2/2 recent dog  bite -Given continued symptoms discussed changing antibiotic from Augmentin to doxycycline. -Patient advised limit sun exposure. -Give precautions  Elevated blood pressure reading -Possibly 2/2 pain and dehydration -Patient encouraged to increase p.o. intake of water -Will have patient follow-up for BP recheck in the next few weeks  Right hand paresthesia -Discussed possible causes including carpal tunnel syndrome or other nerve entrapment -Continue supportive care including wrist brace, NSAIDs, ice, topical analgesics, stretching/exercises - Plan: Ambulatory referral to Hand Surgery  F/u in the next few weeks for BP recheck, sooner if needed for dog bite/leg pain.  Grier Mitts, MD

## 2021-11-08 DIAGNOSIS — H02886 Meibomian gland dysfunction of left eye, unspecified eyelid: Secondary | ICD-10-CM | POA: Diagnosis not present

## 2021-11-08 DIAGNOSIS — H04123 Dry eye syndrome of bilateral lacrimal glands: Secondary | ICD-10-CM | POA: Diagnosis not present

## 2021-11-08 DIAGNOSIS — H02883 Meibomian gland dysfunction of right eye, unspecified eyelid: Secondary | ICD-10-CM | POA: Diagnosis not present

## 2021-11-08 DIAGNOSIS — H527 Unspecified disorder of refraction: Secondary | ICD-10-CM | POA: Diagnosis not present

## 2021-11-12 ENCOUNTER — Other Ambulatory Visit: Payer: Self-pay | Admitting: Neurosurgery

## 2021-11-12 DIAGNOSIS — M5412 Radiculopathy, cervical region: Secondary | ICD-10-CM

## 2021-11-17 DIAGNOSIS — H527 Unspecified disorder of refraction: Secondary | ICD-10-CM | POA: Diagnosis not present

## 2021-11-24 ENCOUNTER — Other Ambulatory Visit: Payer: Self-pay

## 2021-11-24 DIAGNOSIS — H524 Presbyopia: Secondary | ICD-10-CM | POA: Diagnosis not present

## 2021-11-24 DIAGNOSIS — H5203 Hypermetropia, bilateral: Secondary | ICD-10-CM | POA: Diagnosis not present

## 2022-01-10 DIAGNOSIS — N719 Inflammatory disease of uterus, unspecified: Secondary | ICD-10-CM

## 2022-01-10 HISTORY — DX: Inflammatory disease of uterus, unspecified: N71.9

## 2022-02-21 DIAGNOSIS — R7303 Prediabetes: Secondary | ICD-10-CM | POA: Diagnosis not present

## 2022-05-05 DIAGNOSIS — K589 Irritable bowel syndrome without diarrhea: Secondary | ICD-10-CM | POA: Diagnosis not present

## 2022-05-05 DIAGNOSIS — K588 Other irritable bowel syndrome: Secondary | ICD-10-CM | POA: Diagnosis not present

## 2022-05-05 DIAGNOSIS — K219 Gastro-esophageal reflux disease without esophagitis: Secondary | ICD-10-CM | POA: Diagnosis not present

## 2022-08-04 ENCOUNTER — Other Ambulatory Visit: Payer: Self-pay | Admitting: Neurosurgery

## 2022-08-10 ENCOUNTER — Other Ambulatory Visit: Payer: Self-pay | Admitting: Neurosurgery

## 2022-08-30 DIAGNOSIS — K219 Gastro-esophageal reflux disease without esophagitis: Secondary | ICD-10-CM | POA: Diagnosis not present

## 2022-08-30 DIAGNOSIS — R194 Change in bowel habit: Secondary | ICD-10-CM | POA: Diagnosis not present

## 2022-08-30 DIAGNOSIS — Z8711 Personal history of peptic ulcer disease: Secondary | ICD-10-CM | POA: Diagnosis not present

## 2022-08-30 DIAGNOSIS — M5431 Sciatica, right side: Secondary | ICD-10-CM | POA: Diagnosis not present

## 2022-09-27 DIAGNOSIS — Z1231 Encounter for screening mammogram for malignant neoplasm of breast: Secondary | ICD-10-CM | POA: Diagnosis not present

## 2022-09-27 DIAGNOSIS — Z803 Family history of malignant neoplasm of breast: Secondary | ICD-10-CM | POA: Diagnosis not present

## 2022-09-27 DIAGNOSIS — Z01419 Encounter for gynecological examination (general) (routine) without abnormal findings: Secondary | ICD-10-CM | POA: Diagnosis not present

## 2022-09-27 DIAGNOSIS — Z8041 Family history of malignant neoplasm of ovary: Secondary | ICD-10-CM | POA: Diagnosis not present

## 2022-09-27 DIAGNOSIS — N939 Abnormal uterine and vaginal bleeding, unspecified: Secondary | ICD-10-CM | POA: Diagnosis not present

## 2022-10-05 NOTE — Progress Notes (Signed)
Surgical Instructions   Your procedure is scheduled on Thursday October 20, 2022. Report to [facility] Main Entrance "A" at 5:30 A.M., then check in with the Admitting office. Any questions or running late day of surgery: call [phone]  Questions prior to your surgery date: call [phone], Monday-Friday, 8am-4pm. If you experience any cold or flu symptoms such as cough, fever, chills, shortness of breath, etc. between now and your scheduled surgery, please notify us at the above number.     Remember:  Do not eat or drink after midnight the night before your surgery  Take these medicines the morning of surgery with A SIP OF WATER  budesonide-formoterol (SYMBICORT) fluticasone (FLONASE)  montelukast (SINGULAIR) pantoprazole (PROTONIX)    May take these medicines IF NEEDED: albuterol (PROVENTIL HFA;VENTOLIN HFA) Please bring inhaler with you to the hospital  gabapentin (NEURONTIN)    One week prior to surgery, STOP taking any Aspirin (unless otherwise instructed by your surgeon) Aleve, Naproxen, Ibuprofen, Motrin, Advil, Goody's, BC's, all herbal medications, fish oil, and non-prescription vitamins.  This includes your diclofenac sodium (VOLTAREN) 1 % GEL.                      Do NOT Smoke (Tobacco/Vaping) for 24 hours prior to your procedure.  If you use a CPAP at night, you may bring your mask/headgear for your overnight stay.   You will be asked to remove any contacts, glasses, piercing's, hearing aid's, dentures/partials prior to surgery. Please bring cases for these items if needed.    Patients discharged the day of surgery will not be allowed to drive home, and someone needs to stay with them for 24 hours.  SURGICAL WAITING ROOM VISITATION Patients may have no more than 2 support people in the waiting area - these visitors may rotate.   Pre-op nurse will coordinate an appropriate time for 1 ADULT support person, who may not rotate, to accompany patient in pre-op.   Children under the age of 62 must have an adult with them who is not the patient and must remain in the main waiting area with an adult.  If the patient needs to stay at the hospital during part of their recovery, the visitor guidelines for inpatient rooms apply.  Please refer to the [facility] website for the visitor guidelines for any additional information.   If you received a COVID test during your pre-op visit  it is requested that you wear a mask when out in public, stay away from anyone that may not be feeling well and notify your surgeon if you develop symptoms. If you have been in contact with anyone that has tested positive in the last 10 days please notify you surgeon.      Pre-operative 5 CHG Bathing Instructions   You can play a key role in reducing the risk of infection after surgery. Your skin needs to be as free of germs as possible. You can reduce the number of germs on your skin by washing with CHG (chlorhexidine gluconate) soap before surgery. CHG is an antiseptic soap that kills germs and continues to kill germs even after washing.   DO NOT use if you have an allergy to chlorhexidine/CHG or antibacterial soaps. If your skin becomes reddened or irritated, stop using the CHG and notify one of our RNs at [phone].   Please shower with the CHG soap starting 4 days before surgery using the following schedule:     Please keep in mind the  following:  DO NOT shave, including legs and underarms, starting the day of your first shower.   You may shave your face at any point before/day of surgery.  Place clean sheets on your bed the day you start using CHG soap. Use a clean washcloth (not used since being washed) for each shower. DO NOT sleep with pets once you start using the CHG.   CHG Shower Instructions:  Wash your face and private area with normal soap. If you choose to wash your hair, wash first with your normal shampoo.  After you use shampoo/soap, rinse your hair  and body thoroughly to remove shampoo/soap residue.  Turn the water OFF and apply about 3 tablespoons (45 ml) of CHG soap to a CLEAN washcloth.  Apply CHG soap ONLY FROM YOUR NECK DOWN TO YOUR TOES (washing for 3-5 minutes)  DO NOT use CHG soap on face, private areas, open wounds, or sores.  Pay special attention to the area where your surgery is being performed.  If you are having back surgery, having someone wash your back for you may be helpful. Wait 2 minutes after CHG soap is applied, then you may rinse off the CHG soap.  Pat dry with a clean towel  Put on clean clothes/pajamas   If you choose to wear lotion, please use ONLY the CHG-compatible lotions on the back of this paper.   Additional instructions for the day of surgery: DO NOT APPLY any lotions, deodorants or perfumes.   Do not bring valuables to the hospital. [facility] is not responsible for any belongings/valuables. Do not wear nail polish, gel polish, artificial nails, or any other type of covering on natural nails (fingers and toes) Do not wear jewelry or makeup Put on clean/comfortable clothes.  Please brush your teeth.  Ask your nurse before applying any prescription medications to the skin.     CHG Compatible Lotions   Aveeno Moisturizing lotion  Cetaphil Moisturizing Cream  Cetaphil Moisturizing Lotion  Clairol Herbal Essence Moisturizing Lotion, Dry Skin  Clairol Herbal Essence Moisturizing Lotion, Extra Dry Skin  Clairol Herbal Essence Moisturizing Lotion, Normal Skin  Curel Age Defying Therapeutic Moisturizing Lotion with Alpha Hydroxy  Curel Extreme Care Body Lotion  Curel Soothing Hands Moisturizing Hand Lotion  Curel Therapeutic Moisturizing Cream, Fragrance-Free  Curel Therapeutic Moisturizing Lotion, Fragrance-Free  Curel Therapeutic Moisturizing Lotion, Original Formula  Eucerin Daily Replenishing Lotion  Eucerin Dry Skin Therapy Plus Alpha Hydroxy Crme  Eucerin Dry Skin Therapy Plus Alpha  Hydroxy Lotion  Eucerin Original Crme  Eucerin Original Lotion  Eucerin Plus Crme Eucerin Plus Lotion  Eucerin TriLipid Replenishing Lotion  Keri Anti-Bacterial Hand Lotion  Keri Deep Conditioning Original Lotion Dry Skin Formula Softly Scented  Keri Deep Conditioning Original Lotion, Fragrance Free Sensitive Skin Formula  Keri Lotion Fast Absorbing Fragrance Free Sensitive Skin Formula  Keri Lotion Fast Absorbing Softly Scented Dry Skin Formula  Keri Original Lotion  Keri Skin Renewal Lotion Keri Silky Smooth Lotion  Keri Silky Smooth Sensitive Skin Lotion  Nivea Body Creamy Conditioning Oil  Nivea Body Extra Enriched Lotion  Nivea Body Original Lotion  Nivea Body Sheer Moisturizing Lotion Nivea Crme  Nivea Skin Firming Lotion  NutraDerm 30 Skin Lotion  NutraDerm Skin Lotion  NutraDerm Therapeutic Skin Cream  NutraDerm Therapeutic Skin Lotion  ProShield Protective Hand Cream  Provon moisturizing lotion  Please read over the following fact sheets that you were given.

## 2022-10-06 ENCOUNTER — Encounter (HOSPITAL_COMMUNITY): Payer: Self-pay

## 2022-10-06 ENCOUNTER — Other Ambulatory Visit: Payer: Self-pay

## 2022-10-06 ENCOUNTER — Encounter (HOSPITAL_COMMUNITY)
Admission: RE | Admit: 2022-10-06 | Discharge: 2022-10-06 | Disposition: A | Discharge status: Home / Self Care | Payer: No Typology Code available for payment source | Source: Ambulatory Visit | Attending: Neurosurgery | Admitting: Neurosurgery

## 2022-10-06 VITALS — BP 127/86 | HR 74 | Temp 98.3°F | Resp 17 | Ht 65.75 in | Wt 228.0 lb

## 2022-10-06 DIAGNOSIS — Z01818 Encounter for other preprocedural examination: Secondary | ICD-10-CM | POA: Diagnosis present

## 2022-10-06 DIAGNOSIS — K769 Liver disease, unspecified: Secondary | ICD-10-CM | POA: Diagnosis not present

## 2022-10-06 DIAGNOSIS — Z01812 Encounter for preprocedural laboratory examination: Secondary | ICD-10-CM | POA: Insufficient documentation

## 2022-10-06 DIAGNOSIS — Z01810 Encounter for preprocedural cardiovascular examination: Secondary | ICD-10-CM | POA: Diagnosis not present

## 2022-10-06 DIAGNOSIS — I10 Essential (primary) hypertension: Secondary | ICD-10-CM | POA: Diagnosis not present

## 2022-10-06 LAB — COMPREHENSIVE METABOLIC PANEL
ALT: 42 U/L (ref 0–44)
AST: 26 U/L (ref 15–41)
Albumin: 3.8 g/dL (ref 3.5–5.0)
Alkaline Phosphatase: 75 U/L (ref 38–126)
Anion gap: 8 (ref 5–15)
BUN: 7 mg/dL (ref 6–20)
CO2: 24 mmol/L (ref 22–32)
Calcium: 9.3 mg/dL (ref 8.9–10.3)
Chloride: 107 mmol/L (ref 98–111)
Creatinine, Ser: 0.83 mg/dL (ref 0.44–1.00)
GFR, Estimated: >60 mL/min (ref >60)
Glucose, Bld: 105 mg/dL — ABNORMAL HIGH (ref 70–99)
Potassium: 4 mmol/L (ref 3.5–5.1)
Sodium: 139 mmol/L (ref 135–145)
Total Bilirubin: 1 mg/dL (ref 0.3–1.2)
Total Protein: 7.5 g/dL (ref 6.5–8.1)

## 2022-10-06 LAB — SURGICAL PCR SCREEN
MRSA, PCR: NEGATIVE
Staphylococcus aureus: NEGATIVE

## 2022-10-06 LAB — CBC
HCT: 37.5 % (ref 36.0–46.0)
Hemoglobin: 11.8 g/dL — ABNORMAL LOW (ref 12.0–15.0)
MCH: 29 pg (ref 26.0–34.0)
MCHC: 31.5 g/dL (ref 30.0–36.0)
MCV: 92.1 fL (ref 80.0–100.0)
Platelets: 316 10*3/uL (ref 150–400)
RBC: 4.07 MIL/uL (ref 3.87–5.11)
RDW: 13.2 % (ref 11.5–15.5)
WBC: 4.4 10*3/uL (ref 4.0–10.5)
nRBC: 0 % (ref 0.0–0.2)

## 2022-10-06 LAB — TYPE AND SCREEN
ABO/RH(D): AB POS
Antibody Screen: NEGATIVE

## 2022-10-06 NOTE — Progress Notes (Signed)
Surgical Instructions   Your procedure is scheduled on Tuesday, October 8th, 2024. Report to [facility] Main Entrance "A" at 5:30 A.M., then check in with the Admitting office. Any questions or running late day of surgery: call [phone]  Questions prior to your surgery date: call [phone], Monday-Friday, 8am-4pm. If you experience any cold or flu symptoms such as cough, fever, chills, shortness of breath, etc. between now and your scheduled surgery, please notify us at the above number.     Remember:  Do not eat or drink after midnight the night before your surgery  Take these medicines the morning of surgery with A SIP OF WATER  budesonide-formoterol (SYMBICORT) montelukast (SINGULAIR)   May take these medicines IF NEEDED: albuterol (PROVENTIL HFA;VENTOLIN HFA) Please bring inhaler with you to the hospital  gabapentin (NEURONTIN)  baclofen (LIORESAL)   One week prior to surgery, STOP taking any Aspirin (unless otherwise instructed by your surgeon) Aleve, Naproxen, Ibuprofen, Motrin, Advil, Goody's, BC's, all herbal medications, fish oil, and non-prescription vitamins.  This includes your diclofenac sodium (VOLTAREN) 1 % GEL.                      Do NOT Smoke (Tobacco/Vaping) for 24 hours prior to your procedure.  If you use a CPAP at night, you may bring your mask/headgear for your overnight stay.   You will be asked to remove any contacts, glasses, piercing's, hearing aid's, dentures/partials prior to surgery. Please bring cases for these items if needed.    Patients discharged the day of surgery will not be allowed to drive home, and someone needs to stay with them for 24 hours.  SURGICAL WAITING ROOM VISITATION Patients may have no more than 2 support people in the waiting area - these visitors may rotate.   Pre-op nurse will coordinate an appropriate time for 1 ADULT support person, who may not rotate, to accompany patient in pre-op.  Children under the age of 58  must have an adult with them who is not the patient and must remain in the main waiting area with an adult.  If the patient needs to stay at the hospital during part of their recovery, the visitor guidelines for inpatient rooms apply.  Please refer to the [facility] website for the visitor guidelines for any additional information.   If you received a COVID test during your pre-op visit  it is requested that you wear a mask when out in public, stay away from anyone that may not be feeling well and notify your surgeon if you develop symptoms. If you have been in contact with anyone that has tested positive in the last 10 days please notify you surgeon.      Pre-operative 5 CHG Bathing Instructions   You can play a key role in reducing the risk of infection after surgery. Your skin needs to be as free of germs as possible. You can reduce the number of germs on your skin by washing with CHG (chlorhexidine gluconate) soap before surgery. CHG is an antiseptic soap that kills germs and continues to kill germs even after washing.   DO NOT use if you have an allergy to chlorhexidine/CHG or antibacterial soaps. If your skin becomes reddened or irritated, stop using the CHG and notify one of our RNs at [phone].   Please shower with the CHG soap starting 4 days before surgery using the following schedule:     Please keep in mind the following:  DO NOT  shave, including legs and underarms, starting the day of your first shower.   You may shave your face at any point before/day of surgery.  Place clean sheets on your bed the day you start using CHG soap. Use a clean washcloth (not used since being washed) for each shower. DO NOT sleep with pets once you start using the CHG.   CHG Shower Instructions:  Wash your face and private area with normal soap. If you choose to wash your hair, wash first with your normal shampoo.  After you use shampoo/soap, rinse your hair and body thoroughly to remove  shampoo/soap residue.  Turn the water OFF and apply about 3 tablespoons (45 ml) of CHG soap to a CLEAN washcloth.  Apply CHG soap ONLY FROM YOUR NECK DOWN TO YOUR TOES (washing for 3-5 minutes)  DO NOT use CHG soap on face, private areas, open wounds, or sores.  Pay special attention to the area where your surgery is being performed.  If you are having back surgery, having someone wash your back for you may be helpful. Wait 2 minutes after CHG soap is applied, then you may rinse off the CHG soap.  Pat dry with a clean towel  Put on clean clothes/pajamas   If you choose to wear lotion, please use ONLY the CHG-compatible lotions on the back of this paper.   Additional instructions for the day of surgery: DO NOT APPLY any lotions, deodorants or perfumes.   Do not bring valuables to the hospital. [facility] is not responsible for any belongings/valuables. Do not wear nail polish, gel polish, artificial nails, or any other type of covering on natural nails (fingers and toes) Do not wear jewelry or makeup Put on clean/comfortable clothes.  Please brush your teeth.  Ask your nurse before applying any prescription medications to the skin.     CHG Compatible Lotions   Aveeno Moisturizing lotion  Cetaphil Moisturizing Cream  Cetaphil Moisturizing Lotion  Clairol Herbal Essence Moisturizing Lotion, Dry Skin  Clairol Herbal Essence Moisturizing Lotion, Extra Dry Skin  Clairol Herbal Essence Moisturizing Lotion, Normal Skin  Curel Age Defying Therapeutic Moisturizing Lotion with Alpha Hydroxy  Curel Extreme Care Body Lotion  Curel Soothing Hands Moisturizing Hand Lotion  Curel Therapeutic Moisturizing Cream, Fragrance-Free  Curel Therapeutic Moisturizing Lotion, Fragrance-Free  Curel Therapeutic Moisturizing Lotion, Original Formula  Eucerin Daily Replenishing Lotion  Eucerin Dry Skin Therapy Plus Alpha Hydroxy Crme  Eucerin Dry Skin Therapy Plus Alpha Hydroxy Lotion  Eucerin Original  Crme  Eucerin Original Lotion  Eucerin Plus Crme Eucerin Plus Lotion  Eucerin TriLipid Replenishing Lotion  Keri Anti-Bacterial Hand Lotion  Keri Deep Conditioning Original Lotion Dry Skin Formula Softly Scented  Keri Deep Conditioning Original Lotion, Fragrance Free Sensitive Skin Formula  Keri Lotion Fast Absorbing Fragrance Free Sensitive Skin Formula  Keri Lotion Fast Absorbing Softly Scented Dry Skin Formula  Keri Original Lotion  Keri Skin Renewal Lotion Keri Silky Smooth Lotion  Keri Silky Smooth Sensitive Skin Lotion  Nivea Body Creamy Conditioning Oil  Nivea Body Extra Enriched Lotion  Nivea Body Original Lotion  Nivea Body Sheer Moisturizing Lotion Nivea Crme  Nivea Skin Firming Lotion  NutraDerm 30 Skin Lotion  NutraDerm Skin Lotion  NutraDerm Therapeutic Skin Cream  NutraDerm Therapeutic Skin Lotion  ProShield Protective Hand Cream  Provon moisturizing lotion  Please read over the following fact sheets that you were given.

## 2022-10-06 NOTE — Progress Notes (Signed)
PCP - Dr. Abbe [place] Cardiologist - denies  PPM/ICD - n/a  Chest x-ray - n/a EKG - 10/06/22 Stress Test - denies ECHO - denies Cardiac Cath - denies  Sleep Study - Stop bang =6, results routed to PCP  CPAP - denies  Blood Thinner Instructions: n/a Aspirin Instructions: n/a  NPO at MD  COVID TEST- n/a   Anesthesia review: Yes, EKG review.   Patient denies shortness of breath, fever, cough and chest pain at PAT appointment   All instructions explained to the patient, with a verbal understanding of the material. Patient agrees to go over the instructions while at home for a better understanding. Patient also instructed to self quarantine after being tested for COVID-19. The opportunity to ask questions was provided.

## 2022-10-06 NOTE — Progress Notes (Signed)
   10/06/22 0831  OBSTRUCTIVE SLEEP APNEA  Have you ever been diagnosed with sleep apnea through a sleep study? No  Do you snore loudly (loud enough to be heard through closed doors)?  1  Do you often feel tired, fatigued, or sleepy during the daytime (such as falling asleep during driving or talking to someone)? 1  Has anyone observed you stop breathing during your sleep? 1  Do you have, or are you being treated for high blood pressure? 1  BMI more than 35 kg/m2? 1  Age > 50 (1-yes) 0  Neck circumference greater than:Female 16 inches or larger, Female 17inches or larger? 1  Female Gender (Yes=1) 0  Obstructive Sleep Apnea Score 6  Score 5 or greater  Results sent to PCP

## 2022-10-13 DIAGNOSIS — H04123 Dry eye syndrome of bilateral lacrimal glands: Secondary | ICD-10-CM | POA: Diagnosis not present

## 2022-10-13 DIAGNOSIS — H02883 Meibomian gland dysfunction of right eye, unspecified eyelid: Secondary | ICD-10-CM | POA: Diagnosis not present

## 2022-10-13 DIAGNOSIS — H527 Unspecified disorder of refraction: Secondary | ICD-10-CM | POA: Diagnosis not present

## 2022-10-13 DIAGNOSIS — H02886 Meibomian gland dysfunction of left eye, unspecified eyelid: Secondary | ICD-10-CM | POA: Diagnosis not present

## 2022-10-17 NOTE — Anesthesia Preprocedure Evaluation (Signed)
Anesthesia Evaluation  Patient identified by MRN, date of birth, ID band Patient awake    Reviewed: Allergy & Precautions, NPO status , Patient's Chart, lab work & pertinent test results  History of Anesthesia Complications Negative for: history of anesthetic complications  Airway Mallampati: III  TM Distance: >3 FB Neck ROM: Full    Dental  (+) Caps,    Pulmonary asthma    Pulmonary exam normal        Cardiovascular negative cardio ROS Normal cardiovascular exam     Neuro/Psych   Anxiety Depression       GI/Hepatic Neg liver ROS,GERD  Medicated and Controlled,,  Endo/Other  negative endocrine ROS    Renal/GU negative Renal ROS  negative genitourinary   Musculoskeletal  (+) Arthritis ,    Abdominal   Peds  Hematology negative hematology ROS (+)   Anesthesia Other Findings Day of surgery medications reviewed with patient.  Reproductive/Obstetrics negative OB ROS                              Anesthesia Physical Anesthesia Plan  ASA: 2  Anesthesia Plan: General   Post-op Pain Management: Tylenol PO (pre-op)*   Induction: Intravenous  PONV Risk Score and Plan: 3 and Treatment may vary due to age or medical condition, Ondansetron, Dexamethasone, Midazolam and Scopolamine patch - Pre-op  Airway Management Planned: Oral ETT and Video Laryngoscope Planned  Additional Equipment: None  Intra-op Plan:   Post-operative Plan: Extubation in OR  Informed Consent: I have reviewed the patients History and Physical, chart, labs and discussed the procedure including the risks, benefits and alternatives for the proposed anesthesia with the patient or authorized representative who has indicated his/her understanding and acceptance.     Dental advisory given  Plan Discussed with: CRNA  Anesthesia Plan Comments:         Anesthesia Quick Evaluation

## 2022-10-18 ENCOUNTER — Inpatient Hospital Stay (HOSPITAL_COMMUNITY): Payer: No Typology Code available for payment source | Admitting: Physician Assistant

## 2022-10-18 ENCOUNTER — Inpatient Hospital Stay (HOSPITAL_COMMUNITY): Payer: No Typology Code available for payment source

## 2022-10-18 ENCOUNTER — Encounter (HOSPITAL_COMMUNITY)
Admission: RE | Disposition: A | Discharge status: Home / Self Care | Payer: Self-pay | Source: Home / Self Care | Attending: Neurosurgery

## 2022-10-18 ENCOUNTER — Inpatient Hospital Stay (HOSPITAL_COMMUNITY): Payer: No Typology Code available for payment source | Admitting: Certified Registered"

## 2022-10-18 ENCOUNTER — Other Ambulatory Visit: Payer: Self-pay

## 2022-10-18 ENCOUNTER — Inpatient Hospital Stay (HOSPITAL_COMMUNITY)
Admission: RE | Admit: 2022-10-18 | Discharge: 2022-10-19 | DRG: 030 | Disposition: A | Discharge status: Home / Self Care | Payer: No Typology Code available for payment source | Attending: Neurosurgery | Admitting: Neurosurgery

## 2022-10-18 ENCOUNTER — Encounter (HOSPITAL_COMMUNITY): Payer: Self-pay

## 2022-10-18 DIAGNOSIS — Z820 Family history of epilepsy and other diseases of the nervous system: Secondary | ICD-10-CM | POA: Diagnosis not present

## 2022-10-18 DIAGNOSIS — Z888 Allergy status to other drugs, medicaments and biological substances status: Secondary | ICD-10-CM | POA: Diagnosis not present

## 2022-10-18 DIAGNOSIS — Z91041 Radiographic dye allergy status: Secondary | ICD-10-CM | POA: Diagnosis not present

## 2022-10-18 DIAGNOSIS — Z7951 Long term (current) use of inhaled steroids: Secondary | ICD-10-CM

## 2022-10-18 DIAGNOSIS — Z885 Allergy status to narcotic agent status: Secondary | ICD-10-CM | POA: Diagnosis not present

## 2022-10-18 DIAGNOSIS — Z6837 Body mass index (BMI) 37.0-37.9, adult: Secondary | ICD-10-CM | POA: Diagnosis not present

## 2022-10-18 DIAGNOSIS — Z8041 Family history of malignant neoplasm of ovary: Secondary | ICD-10-CM

## 2022-10-18 DIAGNOSIS — Z91013 Allergy to seafood: Secondary | ICD-10-CM | POA: Diagnosis not present

## 2022-10-18 DIAGNOSIS — Z833 Family history of diabetes mellitus: Secondary | ICD-10-CM | POA: Diagnosis not present

## 2022-10-18 DIAGNOSIS — Z79899 Other long term (current) drug therapy: Secondary | ICD-10-CM | POA: Diagnosis not present

## 2022-10-18 DIAGNOSIS — Z8249 Family history of ischemic heart disease and other diseases of the circulatory system: Secondary | ICD-10-CM

## 2022-10-18 DIAGNOSIS — Z803 Family history of malignant neoplasm of breast: Secondary | ICD-10-CM | POA: Diagnosis not present

## 2022-10-18 DIAGNOSIS — Z7722 Contact with and (suspected) exposure to environmental tobacco smoke (acute) (chronic): Secondary | ICD-10-CM | POA: Diagnosis present

## 2022-10-18 DIAGNOSIS — E669 Obesity, unspecified: Secondary | ICD-10-CM | POA: Diagnosis present

## 2022-10-18 DIAGNOSIS — Z886 Allergy status to analgesic agent status: Secondary | ICD-10-CM

## 2022-10-18 DIAGNOSIS — M5412 Radiculopathy, cervical region: Secondary | ICD-10-CM | POA: Diagnosis present

## 2022-10-18 DIAGNOSIS — M4802 Spinal stenosis, cervical region: Secondary | ICD-10-CM | POA: Diagnosis present

## 2022-10-18 DIAGNOSIS — R7303 Prediabetes: Secondary | ICD-10-CM | POA: Diagnosis present

## 2022-10-18 DIAGNOSIS — J45909 Unspecified asthma, uncomplicated: Secondary | ICD-10-CM | POA: Diagnosis present

## 2022-10-18 DIAGNOSIS — I10 Essential (primary) hypertension: Secondary | ICD-10-CM | POA: Diagnosis present

## 2022-10-18 DIAGNOSIS — K219 Gastro-esophageal reflux disease without esophagitis: Secondary | ICD-10-CM | POA: Diagnosis present

## 2022-10-18 HISTORY — PX: ANTERIOR CERVICAL DECOMP/DISCECTOMY FUSION: SHX1161

## 2022-10-18 SURGERY — ANTERIOR CERVICAL DECOMPRESSION/DISCECTOMY FUSION 2 LEVEL/HARDWARE REMOVAL
Anesthesia: General | Site: Spine Cervical

## 2022-10-18 MED ORDER — SODIUM CHLORIDE 0.9% FLUSH: 3.0000 mL | INTRAVENOUS | Status: DC | PRN

## 2022-10-18 MED ORDER — PROPOFOL 10 MG/ML IV BOLUS
INTRAVENOUS | Status: AC
  Filled 2022-10-18: qty 20

## 2022-10-18 MED ORDER — ACETAMINOPHEN 650 MG RE SUPP: 650.0000 mg | RECTAL | Status: DC | PRN

## 2022-10-18 MED ORDER — MENTHOL 3 MG MT LOZG
1.0000 | LOZENGE | OROMUCOSAL | Status: DC | PRN
  Filled 2022-10-18: qty 9

## 2022-10-18 MED ORDER — 0.9 % SODIUM CHLORIDE (POUR BTL) OPTIME
TOPICAL | Status: DC | PRN
  Administered 2022-10-18: 1000 mL

## 2022-10-18 MED ORDER — POLYETHYLENE GLYCOL 3350 17 G PO PACK: 17.0000 g | PACK | Freq: Every day | ORAL | Status: DC | PRN

## 2022-10-18 MED ORDER — KETAMINE HCL 50 MG/5ML IJ SOSY
PREFILLED_SYRINGE | INTRAMUSCULAR | Status: AC
  Filled 2022-10-18: qty 5

## 2022-10-18 MED ORDER — SODIUM CHLORIDE 0.9% FLUSH
3.0000 mL | Freq: Two times a day (BID) | INTRAVENOUS | Status: DC
  Administered 2022-10-18 (×2): 3 mL via INTRAVENOUS

## 2022-10-18 MED ORDER — EPHEDRINE 5 MG/ML INJ
INTRAVENOUS | Status: AC
  Filled 2022-10-18: qty 5

## 2022-10-18 MED ORDER — DIPHENHYDRAMINE HCL 50 MG/ML IJ SOLN
INTRAMUSCULAR | Status: DC | PRN
Start: 2022-10-18 — End: 2022-10-18
  Administered 2022-10-18: 12.5 mg via INTRAVENOUS

## 2022-10-18 MED ORDER — PHENYLEPHRINE HCL-NACL 20-0.9 MG/250ML-% IV SOLN
INTRAVENOUS | Status: AC
  Filled 2022-10-18: qty 500

## 2022-10-18 MED ORDER — CHLORHEXIDINE GLUCONATE CLOTH 2 % EX PADS: 6.0000 | MEDICATED_PAD | Freq: Once | CUTANEOUS | Status: DC

## 2022-10-18 MED ORDER — CEFAZOLIN SODIUM-DEXTROSE 2-4 GM/100ML-% IV SOLN
2.0000 g | Freq: Three times a day (TID) | INTRAVENOUS | Status: AC
  Administered 2022-10-18 (×2): 2 g via INTRAVENOUS
  Filled 2022-10-18 (×2): qty 100

## 2022-10-18 MED ORDER — ACETAMINOPHEN 325 MG PO TABS
ORAL_TABLET | ORAL | Status: DC | PRN
Start: 2022-10-18 — End: 2022-10-18
  Administered 2022-10-18: 975 mg via ORAL

## 2022-10-18 MED ORDER — CEFAZOLIN SODIUM-DEXTROSE 2-4 GM/100ML-% IV SOLN
2.0000 g | INTRAVENOUS | Status: AC
  Administered 2022-10-18: 2 g via INTRAVENOUS
  Filled 2022-10-18: qty 100

## 2022-10-18 MED ORDER — DROPERIDOL 2.5 MG/ML IJ SOLN
0.6250 mg | Freq: Once | INTRAMUSCULAR | Status: AC | PRN
  Administered 2022-10-18: 0.625 mg via INTRAVENOUS

## 2022-10-18 MED ORDER — ORAL CARE MOUTH RINSE: 15.0000 mL | Freq: Once | OROMUCOSAL | Status: AC

## 2022-10-18 MED ORDER — ACETAMINOPHEN 325 MG PO TABS
650.0000 mg | ORAL_TABLET | ORAL | Status: DC | PRN
  Administered 2022-10-19: 650 mg via ORAL
  Filled 2022-10-18: qty 2

## 2022-10-18 MED ORDER — ONDANSETRON HCL 4 MG PO TABS: 4.0000 mg | ORAL_TABLET | Freq: Four times a day (QID) | ORAL | Status: DC | PRN

## 2022-10-18 MED ORDER — ONDANSETRON HCL 4 MG/2ML IJ SOLN
INTRAMUSCULAR | Status: DC | PRN
  Administered 2022-10-18: 4 mg via INTRAVENOUS

## 2022-10-18 MED ORDER — ONDANSETRON HCL 4 MG/2ML IJ SOLN
INTRAMUSCULAR | Status: AC
  Filled 2022-10-18: qty 2

## 2022-10-18 MED ORDER — HYDROMORPHONE HCL 1 MG/ML IJ SOLN: 0.5000 mg | INTRAMUSCULAR | Status: DC | PRN

## 2022-10-18 MED ORDER — ALBUTEROL SULFATE HFA 108 (90 BASE) MCG/ACT IN AERS: 1.0000 | INHALATION_SPRAY | Freq: Four times a day (QID) | RESPIRATORY_TRACT | Status: DC | PRN

## 2022-10-18 MED ORDER — DOCUSATE SODIUM 100 MG PO CAPS
100.0000 mg | ORAL_CAPSULE | Freq: Two times a day (BID) | ORAL | Status: DC
  Administered 2022-10-18 – 2022-10-19 (×2): 100 mg via ORAL
  Filled 2022-10-18 (×2): qty 1

## 2022-10-18 MED ORDER — METHOCARBAMOL 1000 MG/10ML IJ SOLN: 500.0000 mg | Freq: Four times a day (QID) | INTRAVENOUS | Status: DC | PRN

## 2022-10-18 MED ORDER — CHLORHEXIDINE GLUCONATE 0.12 % MT SOLN
15.0000 mL | Freq: Once | OROMUCOSAL | Status: AC
  Administered 2022-10-18: 15 mL via OROMUCOSAL
  Filled 2022-10-18: qty 15

## 2022-10-18 MED ORDER — PHENOL 1.4 % MT LIQD: 1.0000 | OROMUCOSAL | Status: DC | PRN

## 2022-10-18 MED ORDER — BUPIVACAINE HCL (PF) 0.5 % IJ SOLN
INTRAMUSCULAR | Status: AC
  Filled 2022-10-18: qty 30

## 2022-10-18 MED ORDER — LACTATED RINGERS IV SOLN
INTRAVENOUS | Status: DC | PRN
Start: 2022-10-18 — End: 2022-10-18

## 2022-10-18 MED ORDER — SODIUM CHLORIDE 0.9 % IV SOLN: 250.0000 mL | INTRAVENOUS | Status: DC

## 2022-10-18 MED ORDER — MIDAZOLAM HCL 2 MG/2ML IJ SOLN
INTRAMUSCULAR | Status: AC
  Filled 2022-10-18: qty 2

## 2022-10-18 MED ORDER — ROCURONIUM BROMIDE 10 MG/ML (PF) SYRINGE
PREFILLED_SYRINGE | INTRAVENOUS | Status: AC
  Filled 2022-10-18: qty 10

## 2022-10-18 MED ORDER — HYDROMORPHONE HCL 1 MG/ML IJ SOLN
INTRAMUSCULAR | Status: AC
  Filled 2022-10-18: qty 1

## 2022-10-18 MED ORDER — GABAPENTIN 300 MG PO CAPS: 600.0000 mg | ORAL_CAPSULE | Freq: Two times a day (BID) | ORAL | Status: DC | PRN

## 2022-10-18 MED ORDER — FENTANYL CITRATE (PF) 250 MCG/5ML IJ SOLN
INTRAMUSCULAR | Status: AC
  Filled 2022-10-18: qty 5

## 2022-10-18 MED ORDER — DEXAMETHASONE SODIUM PHOSPHATE 10 MG/ML IJ SOLN
INTRAMUSCULAR | Status: DC | PRN
  Administered 2022-10-18: 10 mg via INTRAVENOUS

## 2022-10-18 MED ORDER — SCOPOLAMINE 1 MG/3DAYS TD PT72
MEDICATED_PATCH | TRANSDERMAL | Status: AC
  Filled 2022-10-18: qty 1

## 2022-10-18 MED ORDER — PHENYLEPHRINE 80 MCG/ML (10ML) SYRINGE FOR IV PUSH (FOR BLOOD PRESSURE SUPPORT)
PREFILLED_SYRINGE | INTRAVENOUS | Status: AC
  Filled 2022-10-18: qty 10

## 2022-10-18 MED ORDER — MOMETASONE FURO-FORMOTEROL FUM 100-5 MCG/ACT IN AERO
2.0000 | INHALATION_SPRAY | Freq: Two times a day (BID) | RESPIRATORY_TRACT | Status: DC
  Administered 2022-10-18 – 2022-10-19 (×2): 2 via RESPIRATORY_TRACT
  Filled 2022-10-18: qty 8.8

## 2022-10-18 MED ORDER — FLEET ENEMA RE ENEM: 1.0000 | ENEMA | Freq: Once | RECTAL | Status: DC | PRN

## 2022-10-18 MED ORDER — HYDROMORPHONE HCL 1 MG/ML IJ SOLN
0.2500 mg | INTRAMUSCULAR | Status: DC | PRN
  Administered 2022-10-18: 0.5 mg via INTRAVENOUS

## 2022-10-18 MED ORDER — METHOCARBAMOL 500 MG PO TABS
500.0000 mg | ORAL_TABLET | Freq: Four times a day (QID) | ORAL | Status: DC | PRN
  Administered 2022-10-18 – 2022-10-19 (×3): 500 mg via ORAL
  Filled 2022-10-18 (×3): qty 1

## 2022-10-18 MED ORDER — THROMBIN 5000 UNITS EX SOLR
OROMUCOSAL | Status: DC | PRN
  Administered 2022-10-18: 5 mL via TOPICAL

## 2022-10-18 MED ORDER — ONDANSETRON HCL 4 MG/2ML IJ SOLN: 4.0000 mg | Freq: Four times a day (QID) | INTRAMUSCULAR | Status: DC | PRN

## 2022-10-18 MED ORDER — LIDOCAINE 2% (20 MG/ML) 5 ML SYRINGE
INTRAMUSCULAR | Status: DC | PRN
  Administered 2022-10-18: 60 mg via INTRAVENOUS

## 2022-10-18 MED ORDER — FENTANYL CITRATE (PF) 250 MCG/5ML IJ SOLN
INTRAMUSCULAR | Status: DC | PRN
  Administered 2022-10-18 (×5): 50 ug via INTRAVENOUS

## 2022-10-18 MED ORDER — LACTATED RINGERS IV SOLN: INTRAVENOUS | Status: DC

## 2022-10-18 MED ORDER — DROPERIDOL 2.5 MG/ML IJ SOLN
INTRAMUSCULAR | Status: AC
  Filled 2022-10-18: qty 2

## 2022-10-18 MED ORDER — DEXAMETHASONE SODIUM PHOSPHATE 10 MG/ML IJ SOLN
INTRAMUSCULAR | Status: AC
  Filled 2022-10-18: qty 1

## 2022-10-18 MED ORDER — SCOPOLAMINE 1 MG/3DAYS TD PT72
MEDICATED_PATCH | TRANSDERMAL | Status: DC | PRN
  Administered 2022-10-18: 1 via TRANSDERMAL

## 2022-10-18 MED ORDER — ACETAMINOPHEN 325 MG PO TABS
ORAL_TABLET | ORAL | Status: AC
  Filled 2022-10-18: qty 3

## 2022-10-18 MED ORDER — OXYCODONE HCL 5 MG PO TABS
10.0000 mg | ORAL_TABLET | ORAL | Status: DC | PRN
  Administered 2022-10-18 – 2022-10-19 (×3): 10 mg via ORAL
  Filled 2022-10-18 (×3): qty 2

## 2022-10-18 MED ORDER — THROMBIN 5000 UNITS EX SOLR
CUTANEOUS | Status: AC
  Filled 2022-10-18: qty 5000

## 2022-10-18 MED ORDER — SUGAMMADEX SODIUM 200 MG/2ML IV SOLN
INTRAVENOUS | Status: DC | PRN
  Administered 2022-10-18: 200 mg via INTRAVENOUS

## 2022-10-18 MED ORDER — OXYCODONE HCL 5 MG PO TABS
5.0000 mg | ORAL_TABLET | ORAL | Status: DC | PRN
  Administered 2022-10-19: 5 mg via ORAL
  Filled 2022-10-18: qty 1

## 2022-10-18 MED ORDER — KETAMINE HCL 10 MG/ML IJ SOLN
INTRAMUSCULAR | Status: DC | PRN
Start: 2022-10-18 — End: 2022-10-18
  Administered 2022-10-18: 30 mg via INTRAVENOUS

## 2022-10-18 MED ORDER — PHENYLEPHRINE HCL-NACL 20-0.9 MG/250ML-% IV SOLN
INTRAVENOUS | Status: DC | PRN
Start: 2022-10-18 — End: 2022-10-18
  Administered 2022-10-18: 25 ug/min via INTRAVENOUS

## 2022-10-18 MED ORDER — LIDOCAINE-EPINEPHRINE 1 %-1:100000 IJ SOLN
INTRAMUSCULAR | Status: AC
  Filled 2022-10-18: qty 1

## 2022-10-18 MED ORDER — ROCURONIUM BROMIDE 10 MG/ML (PF) SYRINGE
PREFILLED_SYRINGE | INTRAVENOUS | Status: DC | PRN
  Administered 2022-10-18: 40 mg via INTRAVENOUS
  Administered 2022-10-18: 60 mg via INTRAVENOUS
  Administered 2022-10-18: 20 mg via INTRAVENOUS

## 2022-10-18 MED ORDER — MONTELUKAST SODIUM 10 MG PO TABS
10.0000 mg | ORAL_TABLET | Freq: Every day | ORAL | Status: DC
  Administered 2022-10-19: 10 mg via ORAL
  Filled 2022-10-18: qty 1

## 2022-10-18 MED ORDER — PROPOFOL 10 MG/ML IV BOLUS
INTRAVENOUS | Status: DC | PRN
  Administered 2022-10-18: 170 mg via INTRAVENOUS

## 2022-10-18 MED ORDER — LIDOCAINE 2% (20 MG/ML) 5 ML SYRINGE
INTRAMUSCULAR | Status: AC
  Filled 2022-10-18: qty 5

## 2022-10-18 SURGICAL SUPPLY — 65 items
APL SKNCLS STERI-STRIP NONHPOA (GAUZE/BANDAGES/DRESSINGS) ×1
BAG COUNTER SPONGE SURGICOUNT (BAG) ×1 IMPLANT
BAG SPNG CNTER NS LX DISP (BAG) ×1
BASKET BONE COLLECTION (BASKET) IMPLANT
BENZOIN TINCTURE PRP APPL 2/3 (GAUZE/BANDAGES/DRESSINGS) ×1 IMPLANT
BIT DRILL 13 (BIT) IMPLANT
BIT DRILL NEURO 2X3.1 SFT TUCH (MISCELLANEOUS) ×1 IMPLANT
BLADE CLIPPER SURG (BLADE) IMPLANT
BUR MATCHSTICK NEURO 3.0 LAGG (BURR) ×1 IMPLANT
CANISTER SUCT 3000ML PPV (MISCELLANEOUS) ×1 IMPLANT
CLIP TI WIDE RED SMALL 6 (CLIP) IMPLANT
DEVICE ENDSKLTN IMPL 16X14X7X6 (Cage) IMPLANT
DRAPE C-ARM 42X72 X-RAY (DRAPES) ×2 IMPLANT
DRAPE HALF SHEET 40X57 (DRAPES) IMPLANT
DRAPE LAPAROTOMY 100X72 PEDS (DRAPES) ×1 IMPLANT
DRAPE MICROSCOPE SLANT 54X150 (MISCELLANEOUS) ×1 IMPLANT
DRESSING MEPILEX FLEX 4X4 (GAUZE/BANDAGES/DRESSINGS) ×1 IMPLANT
DRILL NEURO 2X3.1 SOFT TOUCH (MISCELLANEOUS) ×1
DRSG MEPILEX FLEX 4X4 (GAUZE/BANDAGES/DRESSINGS) ×1
DRSG OPSITE 4X5.5 SM (GAUZE/BANDAGES/DRESSINGS) ×2 IMPLANT
DRSG OPSITE POSTOP 4X6 (GAUZE/BANDAGES/DRESSINGS) IMPLANT
DURAPREP 26ML APPLICATOR (WOUND CARE) ×1 IMPLANT
ELECT COATED BLADE 2.86 ST (ELECTRODE) ×1 IMPLANT
ELECT REM PT RETURN 9FT ADLT (ELECTROSURGICAL) ×1
ELECTRODE REM PT RTRN 9FT ADLT (ELECTROSURGICAL) ×1 IMPLANT
ENDOSKELETON IMPLANT 16X14X7X6 (Cage) ×2 IMPLANT
GAUZE 4X4 16PLY ~~LOC~~+RFID DBL (SPONGE) IMPLANT
GLOVE BIO SURGEON STRL SZ7 (GLOVE) ×2 IMPLANT
GLOVE BIOGEL PI IND STRL 7.5 (GLOVE) ×2 IMPLANT
GLOVE ECLIPSE 7.5 STRL STRAW (GLOVE) ×1 IMPLANT
GOWN STRL REUS W/ TWL LRG LVL3 (GOWN DISPOSABLE) ×2 IMPLANT
GOWN STRL REUS W/ TWL XL LVL3 (GOWN DISPOSABLE) ×1 IMPLANT
GOWN STRL REUS W/TWL 2XL LVL3 (GOWN DISPOSABLE) IMPLANT
GOWN STRL REUS W/TWL LRG LVL3 (GOWN DISPOSABLE) ×2
GOWN STRL REUS W/TWL XL LVL3 (GOWN DISPOSABLE) ×1
HEMOSTAT POWDER KIT SURGIFOAM (HEMOSTASIS) ×1 IMPLANT
KIT BASIN OR (CUSTOM PROCEDURE TRAY) ×1 IMPLANT
KIT TURNOVER KIT B (KITS) ×1 IMPLANT
NDL HYPO 22X1.5 SAFETY MO (MISCELLANEOUS) ×1 IMPLANT
NDL SPNL 22GX3.5 QUINCKE BK (NEEDLE) ×1 IMPLANT
NEEDLE HYPO 22X1.5 SAFETY MO (MISCELLANEOUS) ×1 IMPLANT
NEEDLE SPNL 22GX3.5 QUINCKE BK (NEEDLE) ×1 IMPLANT
NS IRRIG 1000ML POUR BTL (IV SOLUTION) ×1 IMPLANT
PACK LAMINECTOMY NEURO (CUSTOM PROCEDURE TRAY) ×1 IMPLANT
PAD ARMBOARD 7.5X6 YLW CONV (MISCELLANEOUS) ×3 IMPLANT
PENCIL BUTTON HOLSTER BLD 10FT (ELECTRODE) IMPLANT
PIN DISTRACTION 14MM (PIN) IMPLANT
PLATE ZEVO 2LVL 37MM (Plate) IMPLANT
PUTTY DBF 1CC CORTICAL FIBERS (Putty) IMPLANT
RASP HELIOCORDIAL MED (MISCELLANEOUS) IMPLANT
SCREW VA SD 3.5X16 (Screw) IMPLANT
SPIKE FLUID TRANSFER (MISCELLANEOUS) ×1 IMPLANT
SPONGE INTESTINAL PEANUT (DISPOSABLE) ×1 IMPLANT
STAPLER VISISTAT 35W (STAPLE) IMPLANT
STRIP CLOSURE SKIN 1/2X4 (GAUZE/BANDAGES/DRESSINGS) ×1 IMPLANT
SUT MNCRL AB 4-0 PS2 18 (SUTURE) ×1 IMPLANT
SUT SILK 2 0 TIES 10X30 (SUTURE) IMPLANT
SUT VIC AB 0 CT1 18XCR BRD8 (SUTURE) IMPLANT
SUT VIC AB 0 CT1 8-18 (SUTURE) ×1
SUT VIC AB 3-0 SH 8-18 (SUTURE) ×1 IMPLANT
TAPE CLOTH 3X10 TAN LF (GAUZE/BANDAGES/DRESSINGS) ×1 IMPLANT
TIP KERRISON THIN FOOTPLATE 2M (MISCELLANEOUS) ×1 IMPLANT
TOWEL GREEN STERILE (TOWEL DISPOSABLE) ×1 IMPLANT
TOWEL GREEN STERILE FF (TOWEL DISPOSABLE) ×1 IMPLANT
WATER STERILE IRR 1000ML POUR (IV SOLUTION) ×1 IMPLANT

## 2022-10-18 NOTE — H&P (Signed)
CC: neck pain  HPI:     Patient is a 47 y.o. female presents with hx of ACDF, progressive neck and arm pain refractory to PT, medical and injection therapy.    Patient Active Problem List   Diagnosis Date Noted   Mixed anxiety and depressive disorder 05/31/2018   Moderate asthma 05/31/2018   Cough variant asthma vs UACS 09/25/2017   Pregnancy 07/15/2016   SVD (spontaneous vaginal delivery) 07/15/2016   Maternal age 70+, multigravida, antepartum 01/12/2016   Sciatica 01/12/2016   Gallstone 01/01/2016   Nonspecific abnormal finding in stool contents 10/15/2013   Abdominal pain, epigastric 10/15/2013   Bowel habit changes 10/15/2013   Internal hemorrhoids 10/15/2013   Active labor 02/05/2013   OBESITY 09/02/2008   GERD 09/02/2008   Constipation 09/02/2008   ANAL FISSURE 09/02/2008   Arthropathy 09/02/2008   Past Medical History:  Diagnosis Date   Allergy    Anal fissure    Arthritis    Asthma    Blood in stool    Cholelithiasis    Depression    GERD (gastroesophageal reflux disease)    Hypertension    Liver hemangioma    Pneumonia    as a child   Pre-diabetes    SVD (spontaneous vaginal delivery) 07/15/2016   Ulcer     Past Surgical History:  Procedure Laterality Date   CERVICAL DISCECTOMY  03-2008   CHOLECYSTECTOMY N/A 04/21/2017   Procedure: LAPAROSCOPIC CHOLECYSTECTOMY;  Surgeon: Berna Bue, MD;  Location: [facility] OR;  Service: General;  Laterality: N/A;   COLONOSCOPY  2010   WISDOM TOOTH EXTRACTION      Medications Prior to Admission  Medication Sig Dispense Refill Last Dose   albuterol (PROVENTIL HFA;VENTOLIN HFA) 108 (90 Base) MCG/ACT inhaler Inhale 1-2 puffs into the lungs every 6 (six) hours as needed for wheezing or shortness of breath.   Past Month   B Complex-C (SUPER B COMPLEX PO) Take 1 capsule by mouth daily.   Past Month   baclofen (LIORESAL) 10 MG tablet Take 10 mg by mouth daily as needed for muscle spasms.   Past Month   budesonide-formoterol  (SYMBICORT) 80-4.5 MCG/ACT inhaler Take 2 puffs first thing in am and then another 2 puffs about 12 hours later. 1 Inhaler 11 Past Week   diclofenac sodium (VOLTAREN) 1 % GEL Apply 2-4 g topically 4 (four) times daily as needed (for pain.).    Past Month   gabapentin (NEURONTIN) 600 MG tablet Take 600 mg by mouth 2 (two) times daily as needed (for pain.).    Past Month   montelukast (SINGULAIR) 10 MG tablet Take 1 tablet (10 mg total) by mouth daily. 30 tablet 2 10/18/2022 at 0515   Multiple Vitamin (MULTIVITAMIN) tablet Take 1 tablet by mouth daily.   Past Month   naproxen (EC NAPROSYN) 500 MG EC tablet Take 500 mg by mouth daily as needed (pain).   Past Month   triamcinolone ointment (KENALOG) 0.1 % Apply 1 application topically daily.    Past Week   fluticasone (FLONASE) 50 MCG/ACT nasal spray Place 1 spray into both nostrils daily. (Patient not taking: Reported on 10/06/2022) 16 g 0 Not Taking   pantoprazole (PROTONIX) 40 MG tablet TAKE 1 TABLET (40 MG TOTAL) BY MOUTH DAILY. TAKE 30-60 MIN BEFORE FIRST MEAL OF THE DAY (Patient not taking: Reported on 10/06/2022) 30 tablet 2 Not Taking   Allergies  Allergen Reactions   Hydrocodone Itching   Acetaminophen Hives and Itching  Guaifenesin Hives and Itching   Hydrocodone-Acetaminophen Itching   Iodinated Contrast Media Dermatitis   Pseudoephedrine Hives, Itching and Other (See Comments)    Burning   Shellfish Allergy Hives and Itching   Antihistamines, Chlorpheniramine-Type Anxiety    Social History   Tobacco Use   Smoking status: Passive Smoke Exposure - Never Smoker   Smokeless tobacco: Never   Tobacco comments:    grandmother smoked in home as a child  Substance Use Topics   Alcohol use: Yes    Alcohol/week: 3.0 standard drinks of alcohol    Types: 3 Shots of liquor per week    Comment: 3 drinks a weeks    Family History  Problem Relation Age of Onset   Breast cancer Maternal Grandmother    Ovarian cancer Mother    Diabetes  Paternal Grandmother    Hypertension Father    Seizures Father    Colon cancer Neg Hx    Esophageal cancer Neg Hx    Stomach cancer Neg Hx    Kidney disease Neg Hx    Liver disease Neg Hx      Review of Systems Pertinent items noted in HPI and remainder of comprehensive ROS otherwise negative.  Objective:   Patient Vitals for the past 8 hrs:  BP Temp Temp src Pulse Resp SpO2 Height Weight  10/18/22 0611 129/81 98.6 F (37 C) Oral 77 17 95 % 5\' 5"  (1.651 m) 101.6 kg   No intake/output data recorded. No intake/output data recorded.      General : Alert, cooperative, no distress, appears stated age   Head:  Normocephalic/atraumatic    Eyes: PERRL, conjunctiva/corneas clear, EOM's intact. Fundi could not be visualized Neck: Supple Chest:  Respirations unlabored Chest wall: no tenderness or deformity Heart: Regular rate and rhythm Abdomen: Soft, nontender and nondistended Extremities: warm and well-perfused Skin: normal turgor, color and texture Neurologic:  Alert, oriented x 3.  Eyes open spontaneously. PERRL, EOMI, VFC, no facial droop. V1-3 intact.  No dysarthria, tongue protrusion symmetric.  CNII-XII intact. Normal strength, sensation and reflexes throughout.  No pronator drift, full strength in legs.  + Spurling's b/l       Data ReviewCBC:  Lab Results  Component Value Date   WBC 4.4 10/06/2022   RBC 4.07 10/06/2022   BMP:  Lab Results  Component Value Date   GLUCOSE 105 (H) 10/06/2022   CO2 24 10/06/2022   BUN 7 10/06/2022   CREATININE 0.83 10/06/2022   CALCIUM 9.3 10/06/2022   Radiology review:  See clinic note  Assessment:   Active Problems:   * No active hospital problems. * Cervical stenosis  Plan:   - plan for ACDF today

## 2022-10-18 NOTE — Anesthesia Procedure Notes (Signed)
Procedure Name: Intubation Date/Time: 10/18/2022 7:58 AM  Performed by: Alwyn Ren, CRNAPre-anesthesia Checklist: Patient identified, Emergency Drugs available, Suction available and Patient being monitored Patient Re-evaluated:Patient Re-evaluated prior to induction Oxygen Delivery Method: Circle system utilized Preoxygenation: Pre-oxygenation with 100% oxygen Induction Type: IV induction Ventilation: Mask ventilation without difficulty Laryngoscope Size: Glidescope and 3 Grade View: Grade I Tube type: Oral Tube size: 7.0 mm Number of attempts: 1 Airway Equipment and Method: Stylet and Oral airway Placement Confirmation: ETT inserted through vocal cords under direct vision, positive ETCO2 and breath sounds checked- equal and bilateral Secured at: 20 cm Tube secured with: Tape Dental Injury: Teeth and Oropharynx as per pre-operative assessment  Comments: Head and neck maintained in position of comfort prior to induction.

## 2022-10-18 NOTE — Transfer of Care (Signed)
Immediate Anesthesia Transfer of Care Note  Patient: [name]  Procedure(s) Performed: Anterior Cervical Decompression Fusion Cervical five-six, Cervical six-seven, Removal CERVICAL PLATE (Spine Cervical)  Patient Location: PACU  Anesthesia Type:General  Level of Consciousness: awake and alert   Airway & Oxygen Therapy: Patient Spontanous Breathing  Post-op Assessment: Report given to RN and Post -op Vital signs reviewed and stable  Post vital signs: Reviewed and stable  Last Vitals:  Vitals Value Taken Time  BP 145/90 10/18/22 1115  Temp    Pulse 89 10/18/22 1118  Resp 26 10/18/22 1118  SpO2 96 % 10/18/22 1118  Vitals shown include unfiled device data.  Last Pain:  Vitals:   10/18/22 0628  TempSrc:   PainSc: 6          Complications: No notable events documented.

## 2022-10-18 NOTE — Op Note (Signed)
PREOP DIAGNOSIS: Cervical radiculopathy  POSTOP DIAGNOSIS: Cervical radiculopathy   PROCEDURE: 1. Arthrodesis C5-6, anterior interbody technique, including Discectomy for decompression of spinal cord and exiting nerve roots with foraminotomies  2. Arthrodesis, additional level C6-7 anterior interbody technique, including Discectomy for  decompression of spinal cord and exiting nerve roots with foraminotomies  3. Removal of previous cervical plate 4. Placement of intervertebral biomechanical device C5-6 5. Placement of intervertebral biomechanical device C6-7 6. Placement of anterior instrumentation consisting of interbody plate and screws C5-6-7 7. Use of morselized bone allograft  8. Use of intraoperative microscope  SURGEON: Dr. Hoyt Koch, MD  ASSISTANT: Patrici Ranks, PA.  Please note, no qualified trainees were available to assist with the procedure.  Assistance was required for retraction of the visceral structures to safely allow for instrumentation.  ANESTHESIA: General Endotracheal  EBL: 25 ml  IMPLANTS: Medtronic 7 mm Titan-C interbody, medium x 2 37 mm plate 16 mm screws x 6  SPECIMENS: None  DRAINS: None  COMPLICATIONS: None immediate  CONDITION: Hemodynamically stable to PACU  HISTORY: This is a 47 yo F with hx of C3-4, C4-5 ACDF who developed radiculopathy refractory to medical, physical and injection therapy.  .  Risks, benefits, alternatives, and expected convalescence were discussed with the patient.  Risks discussed included but were not limited to bleeding, pain, infection, dysphagia, dysphonia, pseudoarthrosis, hardware failure, adjacent segment disease, CSF leak, neurologic deficits, weakness, numbness, paralysis, coma, and death. After all questions were answered, informed consent was obtained and she wished to proceed with C5-6 and C6-7 ACDF  PROCEDURE IN DETAIL: The patient was brought to the operating room and transferred to the operative  table. After induction of general anesthesia, the patient was positioned on the operative table in the supine position with all pressure points meticulously padded. The skin of the neck was then prepped and draped in the usual sterile fashion.  After timeout was conducted, the skin was infiltrated with local anesthetic. Previous left anterior skin incision was then opened sharply and Bovie electrocautery was used to dissect the subcutaneous tissue until the platysma was identified. The platysma was then divided and undermined. .The sternocleidomastoid muscle was then identified and deep investing cervical scar was sharply dissected , the prevertebral fascia was identified and the carotid sheath was retracted laterally and the trachea and esophagus retracted medially. Prevertebral scar was opened sharply and the inferior portion of the previous plate was identified, covered in bone.   Bovie electrocautery was used to dissect in the subperiosteal plane and elevate the bilateral longus coli muscles. Self-retaining retractors were then placed. Bone overgrown over the plate was drilled away and the screw and locking mechanism were uncovered.  Metal-cutting burr was used to cut across the place over the C4-5 segement and locking mechanism was released and screws were removed.  Inferior portion of plate was then removed.  The C5-6 disc space was identified.  Caspar distraction pins were placed in the C5 and C6 bodies to allow for gentle distraction.  At this point, the microscope was draped and brought into the field, and the remainder of the case was done under the microscope using microdissecting technique.  The disc space was incised sharply and combination of high speed drill, curettes, and rongeurs were use to initially complete a discectomy. The high-speed drill was then used to complete discectomy until the posterior annulus was identified and removed and the posterior longitudinal ligament was identified. Using  a nerve hook, the PLL was elevated, and Kerrison rongeurs  were used to remove the posterior longitudinal ligament and the ventral thecal sac was identified. Using a combination of curettes and rongeurs, complete decompression of the thecal sac and exiting nerve roots at this level was completed, and verified with easy passage of micro-nerve hook centrally and in the bilateral foramina.  Having completed our decompression, attention was turned to placement of the intervertebral device. Trial spacers were used to select a size 7 mm graft. This graft was then filled with morcellized allograft, and inserted under live fluoroscopy.  Attention was then turned to the C6-7 level. Caspar distraction pin was placed in the adjacent body to allow for gentle distraction of the disc space.  In a similar fashion, discectomy was completed initially with curettes and rongeurs, and completed with the drill. The PLL was again identified, elevated and incised. Using Kerrison rongeurs, decompression of the spinal cord and exiting roots was completed and confirmed with a dissector. Trial spacers were used to select a 7 mm graft. This graft was then filled with morcellized allograft, and inserted under live fluoroscopy.   After placement of the intervertebral devices, the caspar pins were removed.  An anterior cervical plate was placed across the interspaces for anterior fixation.  Using a high-speed drill, the cortex of the cervical vertebral bodies was punctured, and screws inserted in the vertebral bodies. Final fluoroscopic images in AP and lateral projections were taken to confirm good hardware placement.  At this point, after all counts were verified to be correct, meticulous hemostasis was secured using a combination of bipolar electrocautery and passive hemostatics. The platysma muscle was then closed using interrupted 3-0 Vicryl sutures, and the skin was closed with a 4-0 monocryl in subcutical fashion. Sterile dressings  were then applied and the drapes removed.  The patient tolerated the procedure well and was extubated in the room and taken to the postanesthesia care unit in stable condition.  All counts were correct at the end of the procedure.

## 2022-10-18 NOTE — Anesthesia Postprocedure Evaluation (Signed)
Anesthesia Post Note  Patient: [name]  Procedure(s) Performed: Anterior Cervical Decompression Fusion Cervical five-six, Cervical six-seven, Removal CERVICAL PLATE (Spine Cervical)     Patient location during evaluation: PACU Anesthesia Type: General Level of consciousness: awake and alert Pain management: pain level controlled Vital Signs Assessment: post-procedure vital signs reviewed and stable Respiratory status: spontaneous breathing, nonlabored ventilation and respiratory function stable Cardiovascular status: blood pressure returned to baseline Postop Assessment: no apparent nausea or vomiting Anesthetic complications: no   No notable events documented.  Last Vitals:  Vitals:   10/18/22 1215 10/18/22 1230  BP: (!) 137/94 (!) 134/95  Pulse: 71 87  Resp: 12 14  Temp:  37.1 C  SpO2: 98% 93%    Last Pain:  Vitals:   10/18/22 1230  TempSrc:   PainSc: Asleep                 Shanda Howells

## 2022-10-18 NOTE — Progress Notes (Signed)
Orthopedic Tech Progress Note Patient Details:  [name] 1975/05/29 [id number] Dropped off aspen cervical collar per order.  Ortho Devices Type of Ortho Device: Aspen cervical collar Ortho Device/Splint Interventions: Desmond Dike 10/18/2022, 1:03 PM

## 2022-10-19 ENCOUNTER — Encounter (HOSPITAL_COMMUNITY): Payer: Self-pay | Admitting: Neurosurgery

## 2022-10-19 MED ORDER — OXYCODONE HCL 5 MG PO TABS: 5.0000 mg | ORAL_TABLET | ORAL | 0 refills | Status: DC | PRN

## 2022-10-19 MED ORDER — DOCUSATE SODIUM 100 MG PO CAPS: 100.0000 mg | ORAL_CAPSULE | Freq: Two times a day (BID) | ORAL | 0 refills | Status: DC

## 2022-10-19 NOTE — Evaluation (Addendum)
Occupational Therapy Evaluation Patient Details Name: [name] MRN: [id number] DOB: 03-20-1975 Today's Date: 10/19/2022   History of Present Illness Pt is a 47 y/o F presenting with cervical radiculopathy s/p ACDF C5-6, C6-7 on 10/8. PMH includes arthritis, cholelithaisis, depression, GERD, HTN, pre-diabetes, cervical discectomy.   Clinical Impression   Pt ind at baseline with ADLs/functional mobility, was working for [employer], lives with family who can assist at d/c. Pt currently needing set up - min A for ADLs, mod I for bed mobility and CGA for transfers without AD. Pt educated on collar wear, cervical precautions, and compensatory strategies for ADLs, pt verbalized and demo'd understanding. Discussed use of 3in1 as shower seat for home. Pt presenting with impairments listed below, will follow acutely. Anticipate no OT follow up needs at d/c.        If plan is discharge home, recommend the following: A little help with walking and/or transfers;A lot of help with bathing/dressing/bathroom;Assistance with cooking/housework;Help with stairs or ramp for entrance;Assist for transportation    Functional Status Assessment  Patient has had a recent decline in their functional status and demonstrates the ability to make significant improvements in function in a reasonable and predictable amount of time.  Equipment Recommendations  BSC/3in1 (as shower seat)    Recommendations for Other Services       Precautions / Restrictions Precautions Precautions: Cervical Precaution Booklet Issued: Yes (comment) Precaution Comments: educated on cervical precautions Required Braces or Orthoses: Cervical Brace Cervical Brace: Hard collar;At all times Restrictions Weight Bearing Restrictions: No      Mobility Bed Mobility Overal bed mobility: Modified Independent             General bed mobility comments: educated on log roll, pt pivoting due to having adjustable bed at home     Transfers Overall transfer level: Needs assistance Equipment used: None Transfers: Sit to/from Stand Sit to Stand: Contact guard assist                  Balance Overall balance assessment: Mild deficits observed, not formally tested                                         ADL either performed or assessed with clinical judgement   ADL Overall ADL's : Needs assistance/impaired Eating/Feeding: Set up;Sitting   Grooming: Contact guard assist;Sitting   Upper Body Bathing: Minimal assistance   Lower Body Bathing: Minimal assistance   Upper Body Dressing : Contact guard assist   Lower Body Dressing: Contact guard assist   Toilet Transfer: Contact guard assist;Ambulation;Regular Toilet   Toileting- Clothing Manipulation and Hygiene: Contact guard assist       Functional mobility during ADLs: Contact guard assist       Vision Baseline Vision/History: 1 Wears glasses Vision Assessment?: No apparent visual deficits     Perception Perception: Not tested       Praxis Praxis: Not tested       Pertinent Vitals/Pain Pain Assessment Pain Assessment: No/denies pain     Extremity/Trunk Assessment Upper Extremity Assessment Upper Extremity Assessment: Overall WFL for tasks assessed   Lower Extremity Assessment Lower Extremity Assessment: Overall WFL for tasks assessed   Cervical / Trunk Assessment Cervical / Trunk Assessment: Neck Surgery   Communication Communication Communication: No apparent difficulties   Cognition Arousal: Alert Behavior During Therapy: WFL for tasks assessed/performed Overall Cognitive Status: Within  Functional Limits for tasks assessed                                       General Comments  VSS    Exercises     Shoulder Instructions      Home Living Family/patient expects to be discharged to:: Private residence Living Arrangements: Spouse/significant other;Children Available Help at  Discharge: Family;Available 24 hours/day Type of Home: House Home Access: Stairs to enter [employer] of Steps: 1 Entrance Stairs-Rails: Left Home Layout: Two level;Bed/bath upstairs     Bathroom Shower/Tub: Producer, television/film/video: Handicapped height     Home Equipment: None          Prior Functioning/Environment Prior Level of Function : Independent/Modified Independent;Driving;Working/employed             Mobility Comments: no AD use ADLs Comments: was working for [employer]        OT Problem List: Decreased range of motion;Decreased activity tolerance;Decreased knowledge of precautions;Impaired balance (sitting and/or standing)      OT Treatment/Interventions: Self-care/ADL training;Therapeutic exercise;Energy conservation;DME and/or AE instruction;Therapeutic activities;Patient/family education;Balance training    OT Goals(Current goals can be found in the care plan section) Acute Rehab OT Goals Patient Stated Goal: none stated OT Goal Formulation: With patient Time For Goal Achievement: 11/02/22 Potential to Achieve Goals: Good  OT Frequency: Min 1X/week    Co-evaluation              AM-PAC OT "6 Clicks" Daily Activity     Outcome Measure Help from another person eating meals?: None Help from another person taking care of personal grooming?: A Little Help from another person toileting, which includes using toliet, bedpan, or urinal?: A Little Help from another person bathing (including washing, rinsing, drying)?: A Little Help from another person to put on and taking off regular upper body clothing?: A Little Help from another person to put on and taking off regular lower body clothing?: A Little 6 Click Score: 19   End of Session Equipment Utilized During Treatment: Cervical collar Nurse Communication: Mobility status  Activity Tolerance: Patient tolerated treatment well Patient left: in bed;with call bell/phone within reach  OT  Visit Diagnosis: Unsteadiness on feet (R26.81);Muscle weakness (generalized) (M62.81)                Time: 1610-9604 OT Time Calculation (min): 24 min Charges:  OT General Charges $OT Visit: 1 Visit OT Evaluation $OT Eval Low Complexity: 1 Low OT Treatments $Self Care/Home Management : 8-22 mins  Carver Fila, OTD, OTR/L SecureChat Preferred Acute Rehab [phone]   [name] 10/19/2022, 9:08 AM

## 2022-10-19 NOTE — Discharge Instructions (Signed)
  Wound Care Keep incision covered and dry until post op day 3. You may remove the Honeycomb dressing on post op day 3. Leave steri-strips on neck.  They will fall off by themselves. Do not put any creams, lotions, or ointments on incision. You are fine to shower. Let water run over incision and pat dry.  Activity Walk each and every day, increasing distance each day. No lifting greater than 8 lbs.  Avoid excessive back motion. No driving, or riding a car unless coming back and forth to see the doctor  Diet Resume your normal diet.   Return to Work Will be discussed at your follow up appointment.  Call Your Doctor If Any of These Occur Redness, drainage, or swelling at the wound.  Temperature greater than 101 degrees. Severe pain not relieved by pain medication. Incision starts to come apart.  Follow Up Appt Call [phone] if you have one or any problem.

## 2022-10-19 NOTE — Progress Notes (Signed)
PT Cancellation Note and Discharge  Patient Details Name: [name] MRN: [id number] DOB: [birth year]   Cancelled Treatment:    Reason Eval/Treat Not Completed: PT screened, no needs identified, will sign off. Discussed pt case with OT who reports pt is currently mobilizing without assistance level and does not require a formal PT evaluation at this time. PT signing off. If needs change, please reconsult.     Marylynn Pearson 10/19/2022, 10:03 AM  Conni Slipper, PT, DPT Acute Rehabilitation Services Secure Chat Preferred Office: [phone]

## 2022-10-19 NOTE — Progress Notes (Signed)
Patient alert and oriented, mae's well, voiding adequate amount of urine, swallowing without difficulty, no c/o pain at time of discharge. Patient discharged home with family. Script and discharged instructions given to patient. Patient and family stated understanding of instructions given. Patient has an appointment with Dr. Thomas in 2 weeks 

## 2022-10-19 NOTE — Discharge Summary (Signed)
Patient ID: [name] MRN: [id number] DOB/AGE: 1975-11-06 47 y.o.  Admit date: 10/18/2022 Discharge date: 10/19/2022  Admission Diagnoses: Cervical radiculopathy   Discharge Diagnoses: Cervical radiculopathy    Discharged Condition: Stable  Hospital Course:  [name] is a 47 y.o. female who was admitted following an uncomplicated ACDF C5-6, C6-7, hardware removal. They were recovered in PACU and transferred to [facility]. Hospital course was uncomplicated. Pt stable for discharge today. Pt to f/u in office for routine post op visit. Pt is in agreement w/ plan.    Discharge Exam: Blood pressure 110/68, pulse (!) 51, temperature 98.2 F (36.8 C), temperature source Oral, resp. rate 16, height 5\' 5"  (1.651 m), weight 101.6 kg, last menstrual period 04/24/2017, SpO2 100%. A&O x3 Speech fluent, appropriate Strength 5/5 x4.  SILTx4.  Dressing c/d/I.   Disposition: Discharge disposition: 01-Home or Self Care       Discharge Instructions     Incentive spirometry RT   Complete by: As directed       Allergies as of 10/19/2022       Reactions   Hydrocodone Itching   Acetaminophen Hives, Itching   Guaifenesin Hives, Itching   Hydrocodone-acetaminophen Itching   Iodinated Contrast Media Dermatitis   Pseudoephedrine Hives, Itching, Other (See Comments)   Burning   Shellfish Allergy Hives, Itching   Antihistamines, Chlorpheniramine-type Anxiety        Medication List     STOP taking these medications    naproxen 500 MG EC tablet Commonly known as: EC NAPROSYN       TAKE these medications    albuterol 108 (90 Base) MCG/ACT inhaler Commonly known as: VENTOLIN HFA Inhale 1-2 puffs into the lungs every 6 (six) hours as needed for wheezing or shortness of breath.   baclofen 10 MG tablet Commonly known as: LIORESAL Take 10 mg by mouth daily as needed for muscle spasms.   budesonide-formoterol 80-4.5 MCG/ACT inhaler Commonly known as: Symbicort Take 2  puffs first thing in am and then another 2 puffs about 12 hours later.   diclofenac sodium 1 % Gel Commonly known as: VOLTAREN Apply 2-4 g topically 4 (four) times daily as needed (for pain.).   docusate sodium 100 MG capsule Commonly known as: COLACE Take 1 capsule (100 mg total) by mouth 2 (two) times daily.   fluticasone 50 MCG/ACT nasal spray Commonly known as: FLONASE Place 1 spray into both nostrils daily.   gabapentin 600 MG tablet Commonly known as: NEURONTIN Take 600 mg by mouth 2 (two) times daily as needed (for pain.).   montelukast 10 MG tablet Commonly known as: SINGULAIR Take 1 tablet (10 mg total) by mouth daily.   multivitamin tablet Take 1 tablet by mouth daily.   oxyCODONE 5 MG immediate release tablet Commonly known as: Oxy IR/ROXICODONE Take 1 tablet (5 mg total) by mouth every 4 (four) hours as needed for moderate pain ((score 4 to 6)).   pantoprazole 40 MG tablet Commonly known as: PROTONIX TAKE 1 TABLET (40 MG TOTAL) BY MOUTH DAILY. TAKE 30-60 MIN BEFORE FIRST MEAL OF THE DAY   SUPER B COMPLEX PO Take 1 capsule by mouth daily.   triamcinolone ointment 0.1 % Commonly known as: KENALOG Apply 1 application topically daily.        Follow-up Information     Bedelia Person, MD. Call.   Specialty: Neurosurgery Why: As needed, If symptoms worsen Contact information: [address] [place] [address] [phone]  Signed: Clovis Riley 10/19/2022, 11:55 AM

## 2022-10-20 ENCOUNTER — Telehealth: Payer: Self-pay

## 2022-10-20 NOTE — Transitions of Care (Post Inpatient/ED Visit) (Signed)
10/20/2022  Name: [name] MRN: [id number] DOB: 11-03-1975  Today's TOC FU Call Status: Today's TOC FU Call Status:: Successful TOC FU Call Completed TOC FU Call Complete Date: 10/20/22 Patient's Name and Date of Birth confirmed.  Transition Care Management Follow-up Telephone Call Date of Discharge: 10/19/22 Discharge Facility: [facility]) Type of Discharge: Inpatient Admission Primary Inpatient Discharge Diagnosis:: "cervical radiculopathy" How have you been since you were released from the hospital?: Better (Pt states she is doing okay-current pain 4/10-has not taken any pain med since 4pm yest-Appetite fair-throat still sore-eating soft foods and warm drinks, LBM today, Drsg intact) Any questions or concerns?: Yes Patient Questions/Concerns:: Pt voices he does not like Oxycodone doesn't feel like med really managing pain-and doesn't like the way it makes her feels-got better pain relief from msucle spasm med Patient Questions/Concerns Addressed: Other: (Pain mgmt measures discussed- pt will call surgeon office to see if she can get script for muscle spasm med to take more than 1x/day and to discuss what other pain meds she may be able to try taking)  Items Reviewed: Did you receive and understand the discharge instructions provided?: Yes Medications obtained,verified, and reconciled?: Partial Review Completed Reason for Partial Mediation Review: reviewed sx mgmt meds with pt Any new allergies since your discharge?: No Dietary orders reviewed?: Yes Type of Diet Ordered:: low salt/heart healthy Do you have support at home?: Yes People in Home: spouse Name of Support/Comfort Primary Source: Kevin Fenton  Medications Reviewed Today: Medications Reviewed Today     Reviewed by Charlyn Minerva, RN (Registered Nurse) on 10/20/22 at 1304  Med List Status: <None>   Medication Order Taking? Sig Documenting Provider Last Dose Status Informant  albuterol (PROVENTIL  HFA;VENTOLIN HFA) 108 (90 Base) MCG/ACT inhaler [id number]  Inhale 1-2 puffs into the lungs every 6 (six) hours as needed for wheezing or shortness of breath. [provider]  Active Self, Pharmacy Records  B Complex-C (SUPER B COMPLEX PO) [id number]  Take 1 capsule by mouth daily. [provider]  Active Self, Pharmacy Records  baclofen (LIORESAL) 10 MG tablet [id number] Yes Take 10 mg by mouth daily as needed for muscle spasms. [provider] Taking Active Self, Pharmacy Records  budesonide-formoterol [facility]) 80-4.5 MCG/ACT inhaler [id number]  Take 2 puffs first thing in am and then another 2 puffs about 12 hours later. Nyoka Cowden, MD  Active Self, Pharmacy Records  diclofenac sodium (VOLTAREN) 1 % GEL [id number]  Apply 2-4 g topically 4 (four) times daily as needed (for pain.).  [provider]  Active Self, Pharmacy Records  docusate sodium (COLACE) 100 MG capsule [id number] Yes Take 1 capsule (100 mg total) by mouth 2 (two) times daily. Patrici Ranks [place], PA-C Taking Active   fluticasone (FLONASE) 50 MCG/ACT nasal spray [id number]  Place 1 spray into both nostrils daily.  Patient not taking: Reported on 10/06/2022   Deeann Saint, MD  Active Self, Pharmacy Records  gabapentin (NEURONTIN) 600 MG tablet [id number]  Take 600 mg by mouth 2 (two) times daily as needed (for pain.).  [provider]  Active Self, Pharmacy Records  montelukast (SINGULAIR) 10 MG tablet [id number] Yes Take 1 tablet (10 mg total) by mouth daily. Nyoka Cowden, MD Taking Active Self, Pharmacy Records  Multiple Vitamin (MULTIVITAMIN) tablet [id number] Yes Take 1 tablet by mouth daily. [provider] Taking Active Self, Pharmacy Records  oxyCODONE (OXY IR/ROXICODONE) 5 MG immediate release tablet [id number] Yes Take 1 tablet (5 mg total)  by mouth every 4 (four) hours as needed for moderate pain ((score 4 to 6)). Patrici Ranks [place], PA-C Taking Active    pantoprazole (PROTONIX) 40 MG tablet [id number]  TAKE 1 TABLET (40 MG TOTAL) BY MOUTH DAILY. TAKE 30-60 MIN BEFORE FIRST MEAL OF THE DAY  Patient not taking: Reported on 10/06/2022   Nyoka Cowden, MD  Active Self, Pharmacy Records  triamcinolone ointment (KENALOG) 0.1 % [id number]  Apply 1 application topically daily.  [provider]  Active Self, Pharmacy Records            Home Care and Equipment/Supplies: Were Home Health Services Ordered?: NA Any new equipment or medical supplies ordered?: NA  Functional Questionnaire: Do you need assistance with bathing/showering or dressing?: No Do you need assistance with meal preparation?: No Do you need assistance with eating?: No Do you have difficulty maintaining continence: No Do you need assistance with getting out of bed/getting out of a chair/moving?: No Do you have difficulty managing or taking your medications?: No  Follow up appointments reviewed: PCP Follow-up appointment confirmed?: No MD Provider Line Number:[phone] Given: No Specialist Hospital Follow-up appointment confirmed?: Yes Date of Specialist follow-up appointment?: 11/02/22 Follow-Up Specialty Provider:: Dr. Lelon Mast Do you need transportation to your follow-up appointment?: No (pt aware to not resume driving until medically cleared by doctor-cofnirms spouse will take her to appt) Do you understand care options if your condition(s) worsen?: Yes-patient verbalized understanding  SDOH Interventions Today    Flowsheet Row Most Recent Value  SDOH Interventions   Transportation Interventions Intervention Not Indicated      TOC Interventions Today    Flowsheet Row Most Recent Value  TOC Interventions   TOC Interventions Discussed/Reviewed TOC Interventions Discussed, S/S of infection, Post op wound/incision care, Post discharge activity limitations per provider      Interventions Today    Flowsheet Row Most Recent Value  General  Interventions   General Interventions Discussed/Reviewed General Interventions Discussed, Doctor Visits  Doctor Visits Discussed/Reviewed Doctor Visits Discussed, PCP, Specialist  PCP/Specialist Visits Compliance with follow-up visit  Education Interventions   Education Provided Provided Education  Provided Verbal Education On Nutrition, Medication, When to see the doctor, Other  [pain mgmt, bowel mgmt]  Nutrition Interventions   Nutrition Discussed/Reviewed Nutrition Discussed, Fluid intake  Pharmacy Interventions   Pharmacy Dicussed/Reviewed Pharmacy Topics Discussed, Medications and their functions  Safety Interventions   Safety Discussed/Reviewed Safety Discussed       TOC Interventions Today    Flowsheet Row Most Recent Value  TOC Interventions   TOC Interventions Discussed/Reviewed TOC Interventions Discussed, S/S of infection, Post op wound/incision care, Post discharge activity limitations per provider      Interventions Today    Flowsheet Row Most Recent Value  General Interventions   General Interventions Discussed/Reviewed General Interventions Discussed, Doctor Visits  Doctor Visits Discussed/Reviewed Doctor Visits Discussed, PCP, Specialist  PCP/Specialist Visits Compliance with follow-up visit  Education Interventions   Education Provided Provided Education  Provided Verbal Education On Nutrition, Medication, When to see the doctor, Other  [pain mgmt, bowel mgmt]  Nutrition Interventions   Nutrition Discussed/Reviewed Nutrition Discussed, Fluid intake  Pharmacy Interventions   Pharmacy Dicussed/Reviewed Pharmacy Topics Discussed, Medications and their functions  Safety Interventions   Safety Discussed/Reviewed Safety Discussed        Antionette Fairy, RN,BSN,CCM RN Care Manager Transitions of Care  [employer]  Direct Phone: [phone] Toll Free: [phone] Fax: [phone]

## 2022-10-26 DIAGNOSIS — H527 Unspecified disorder of refraction: Secondary | ICD-10-CM | POA: Diagnosis not present

## 2022-11-07 DIAGNOSIS — N939 Abnormal uterine and vaginal bleeding, unspecified: Secondary | ICD-10-CM | POA: Diagnosis not present

## 2022-11-07 DIAGNOSIS — Z3202 Encounter for pregnancy test, result negative: Secondary | ICD-10-CM | POA: Diagnosis not present

## 2022-12-30 ENCOUNTER — Telehealth: Payer: Self-pay | Admitting: Family Medicine

## 2022-12-30 DIAGNOSIS — J4541 Moderate persistent asthma with (acute) exacerbation: Secondary | ICD-10-CM

## 2022-12-30 MED ORDER — AZITHROMYCIN 250 MG PO TABS
ORAL_TABLET | ORAL | 0 refills | Status: DC
Start: 2022-12-30 — End: 2022-12-30

## 2022-12-30 MED ORDER — PREDNISONE 20 MG PO TABS: 20.0000 mg | ORAL_TABLET | Freq: Two times a day (BID) | ORAL | 0 refills | Status: DC

## 2022-12-30 MED ORDER — AZITHROMYCIN 250 MG PO TABS: ORAL_TABLET | ORAL | 0 refills | Status: AC

## 2022-12-30 MED ORDER — PREDNISONE 20 MG PO TABS: 20.0000 mg | ORAL_TABLET | Freq: Two times a day (BID) | ORAL | 0 refills | Status: AC

## 2022-12-30 MED ORDER — ALBUTEROL SULFATE HFA 108 (90 BASE) MCG/ACT IN AERS: 2.0000 | INHALATION_SPRAY | Freq: Four times a day (QID) | RESPIRATORY_TRACT | 0 refills | Status: DC | PRN

## 2022-12-30 NOTE — Progress Notes (Signed)

## 2023-01-12 ENCOUNTER — Ambulatory Visit: Payer: BC Managed Care – PPO | Admitting: Family Medicine

## 2023-01-12 ENCOUNTER — Ambulatory Visit: Payer: Self-pay | Admitting: Family Medicine

## 2023-01-12 ENCOUNTER — Encounter: Payer: Self-pay | Admitting: Family Medicine

## 2023-01-12 VITALS — BP 143/92 | HR 80 | Wt 230.4 lb

## 2023-01-12 DIAGNOSIS — N950 Postmenopausal bleeding: Secondary | ICD-10-CM

## 2023-01-12 DIAGNOSIS — N939 Abnormal uterine and vaginal bleeding, unspecified: Secondary | ICD-10-CM

## 2023-01-12 NOTE — Progress Notes (Signed)
 OFFICE VISIT  01/12/2023  CC:  Chief Complaint  Patient presents with   Vaginal Bleeding    Issue occurred a while back, US  was done and pt found out she has remaining placenta. Bleeding has came back a few days. Pt still bleeding, mild pain, nausea. Pt has GYN.     Patient is a 48 y.o. female who presents for vaginal bleeding.  HPI: Onset of vaginal bleeding 2 days ago.  Continuous slow flow. She has a little bit of lower abdominal cramping that she describes similar to menstrual cramps.  A little bit of nausea. Interestingly, she reports similar bleeding that occurred back in July 2024.  She had bleeding for about 5 days.  She was seen by her GYN a couple of months later and patient reports her Pap at that time was normal as was her pelvic exam.  A pelvic ultrasound was obtained and patient reports it was normal.  A endometrial biopsy was performed and patient states that she was told it showed placental tissue. She states she was given 2 weeks of antibiotics. She is not sure what the follow-up was supposed to be after that time.  She had no further bleeding until 2 days ago.  Her last child was in 2019.  She resumed normal menses after that for a few years.  She then went about 18 months without any vaginal bleeding at all until July 2024.  She has had 3 children.  She had 1 spontaneous abortion between her first and second children. Her current GYN MD is Dr. Rolan. Patient reports she is monogamous with her husband who has had a vasectomy.  Past Medical History:  Diagnosis Date   Allergy     Anal fissure    Arthritis    Asthma    Blood in stool    Cholelithiasis    Depression    GERD (gastroesophageal reflux disease)    Hypertension    Liver hemangioma    Pneumonia    as a child   Pre-diabetes    SVD (spontaneous vaginal delivery) 07/15/2016   Ulcer     Past Surgical History:  Procedure Laterality Date   ANTERIOR CERVICAL DECOMP/DISCECTOMY FUSION N/A 10/18/2022    Procedure: Anterior Cervical Decompression Fusion Cervical five-six, Cervical six-seven, Removal CERVICAL PLATE;  Surgeon: Debby Dorn MATSU, MD;  Location: [facility] OR;  Service: Neurosurgery;  Laterality: N/A;   CERVICAL DISCECTOMY  03-2008   CHOLECYSTECTOMY N/A 04/21/2017   Procedure: LAPAROSCOPIC CHOLECYSTECTOMY;  Surgeon: Signe Mitzie LABOR, MD;  Location: [facility] OR;  Service: General;  Laterality: N/A;   COLONOSCOPY  2010   WISDOM TOOTH EXTRACTION      Outpatient Medications Prior to Visit  Medication Sig Dispense Refill   albuterol  (PROVENTIL  HFA;VENTOLIN  HFA) 108 (90 Base) MCG/ACT inhaler Inhale 1-2 puffs into the lungs every 6 (six) hours as needed for wheezing or shortness of breath.     albuterol  (VENTOLIN  HFA) 108 (90 Base) MCG/ACT inhaler Inhale 2 puffs into the lungs every 6 (six) hours as needed for wheezing or shortness of breath. 8 g 0   B Complex-C (SUPER B COMPLEX PO) Take 1 capsule by mouth daily.     baclofen (LIORESAL) 10 MG tablet Take 10 mg by mouth daily as needed for muscle spasms.     budesonide -formoterol  (SYMBICORT ) 80-4.5 MCG/ACT inhaler Take 2 puffs first thing in am and then another 2 puffs about 12 hours later. 1 Inhaler 11   diclofenac  sodium (VOLTAREN ) 1 % GEL Apply 2-4  g topically 4 (four) times daily as needed (for pain.).      docusate sodium  (COLACE) 100 MG capsule Take 1 capsule (100 mg total) by mouth 2 (two) times daily. 30 capsule 0   gabapentin  (NEURONTIN ) 600 MG tablet Take 600 mg by mouth 2 (two) times daily as needed (for pain.).      montelukast  (SINGULAIR ) 10 MG tablet Take 1 tablet (10 mg total) by mouth daily. 30 tablet 2   Multiple Vitamin (MULTIVITAMIN) tablet Take 1 tablet by mouth daily.     oxyCODONE  (OXY IR/ROXICODONE ) 5 MG immediate release tablet Take 1 tablet (5 mg total) by mouth every 4 (four) hours as needed for moderate pain ((score 4 to 6)). 30 tablet 0   triamcinolone  ointment (KENALOG ) 0.1 % Apply 1 application topically daily.       fluticasone  (FLONASE ) 50 MCG/ACT nasal spray Place 1 spray into both nostrils daily. (Patient not taking: Reported on 10/06/2022) 16 g 0   pantoprazole  (PROTONIX ) 40 MG tablet TAKE 1 TABLET (40 MG TOTAL) BY MOUTH DAILY. TAKE 30-60 MIN BEFORE FIRST MEAL OF THE DAY (Patient not taking: Reported on 10/06/2022) 30 tablet 2   No facility-administered medications prior to visit.    Allergies  Allergen Reactions   Hydrocodone Itching   Acetaminophen  Hives and Itching   Guaifenesin Hives and Itching   Hydrocodone-Acetaminophen  Itching   Iodinated Contrast Media Dermatitis   Pseudoephedrine Hives, Itching and Other (See Comments)    Burning   Shellfish Allergy  Hives and Itching   Antihistamines, Chlorpheniramine-Type Anxiety    Review of Systems  As per HPI  PE:    01/12/2023    1:54 PM 10/19/2022    7:42 AM 10/19/2022    5:28 AM  Vitals with BMI  Weight 230 lbs 6 oz    Systolic 143 110 852  Diastolic 92 68 93  Pulse 80 51 65     Physical Exam  Gen: Alert, well appearing.  Patient is oriented to person, place, time, and situation. AFFECT: pleasant, lucid thought and speech. Vaginal exam: normal genitalia, blood in vaginal vault, cervix appears normal.  LABS:  Last CBC Lab Results  Component Value Date   WBC 4.4 10/06/2022   HGB 11.8 (L) 10/06/2022   HCT 37.5 10/06/2022   MCV 92.1 10/06/2022   MCH 29.0 10/06/2022   RDW 13.2 10/06/2022   PLT 316 10/06/2022   Last metabolic panel Lab Results  Component Value Date   GLUCOSE 105 (H) 10/06/2022   NA 139 10/06/2022   K 4.0 10/06/2022   CL 107 10/06/2022   CO2 24 10/06/2022   BUN 7 10/06/2022   CREATININE 0.83 10/06/2022   GFRNONAA >60 10/06/2022   CALCIUM 9.3 10/06/2022   PROT 7.5 10/06/2022   ALBUMIN 3.8 10/06/2022   BILITOT 1.0 10/06/2022   ALKPHOS 75 10/06/2022   AST 26 10/06/2022   ALT 42 10/06/2022   ANIONGAP 8 10/06/2022   IMPRESSION AND PLAN:  Postmenopausal vaginal bleeding. Unknown etiology. Patient  reports history of endometrial biopsy last year with the unusual result of placental tissue. Currently in the process of obtaining GYN records. Today we will check CBC, iron panel, and serum beta-hCG quant.  Spent 50 min with pt today reviewing HPI, reviewing relevant past history, doing exam, reviewing and discussing lab and imaging data, and formulating plans.  An After Visit Summary was printed and given to the patient.  FOLLOW UP: Return for f/u TBD based on labs.  Signed:  Gerlene  Willman Cuny, MD           01/12/2023

## 2023-01-12 NOTE — Telephone Encounter (Signed)
 Chief Complaint: vaginal bleeding Symptoms: fatigue, nausea, vaginal bleeding with some blood clots (size of a penny) Frequency: x 2 days Pertinent Negatives: Patient denies fever, chest pain, SOB, syncope  Disposition: [] ED /[] Urgent Care (no appt availability in office) / [x] Appointment(In office/virtual)/ []  [place] [facility]/ [] Home Care/ [] Refused Recommended Disposition /[] [facility]/ []  Follow-up with PCP Additional Notes: Patient states she feels her OBGYN is not appropriately handling the issue and would like to address with a PCP. Patient states she was told in October she had residual placenta and states her OBGYN attributes this bleeding to the placenta. Patient agreeable to office visit with other PCP location due to availability.   Copied from CRM [id number]. Topic: Clinical - Red Word Triage >> Jan 12, 2023 12:04 PM Joanell B wrote: Red Word that prompted transfer to Nurse Triage: Pt has been in menopause for two years and is having unexpected bleeding. It started for the second time on new new years eve and has not stopped. Reason for Disposition  [1] Constant abdominal pain AND [2] present > 2 hours  Answer Assessment - Initial Assessment Questions 1. AMOUNT: Describe the bleeding that you are having.    - SPOTTING: spotting, or pinkish / brownish mucous discharge; does not fill panty liner or pad    - MILD:  less than 1 pad / hour; less than patient's usual menstrual bleeding   - MODERATE: 1-2 pads / hour; 1 menstrual cup every 6 hours; small-medium blood clots (e.g., pea, grape, small coin)   - SEVERE: soaking 2 or more pads/hour for 2 or more hours; 1 menstrual cup every 2 hours; bleeding not contained by pads or continuous red blood from vagina; large blood clots (e.g., golf ball, large coin)      Patient states the blood on the pad is very dark. She states she has noticed blood clots in the toilet. She states when she wipes it is bright red. Patient states  she is changing her pad every 3 hours and about 30% of the pad has blood on it. Patient states the blood clots were the size of a penny.  2. ONSET: When did the bleeding begin? Is it continuing now?     Patient states bleeding started on the night of 01/10/23.  3. MENSTRUAL PERIOD: When was the last normal menstrual period? How is this different than your period?     Patient states she is going through menopause and does not have normal or regular menstrual cycles. Last episode of bleeding was in July.  4. REGULARITY: How regular are your periods?     Not regular, pt states she is menopausal.  5. ABDOMEN PAIN: Do you have any pain? How bad is the pain?  (e.g., Scale 1-10; mild, moderate, or severe)   - MILD (1-3): doesn't interfere with normal activities, abdomen soft and not tender to touch    - MODERATE (4-7): interferes with normal activities or awakens from sleep, abdomen tender to touch    - SEVERE (8-10): excruciating pain, doubled over, unable to do any normal activities      Lower right abdominal pain. 4-5/10.  6. PREGNANCY: Is there any chance you are pregnant? When was your last menstrual period?     Patient denies.  7. BREASTFEEDING: Are you breastfeeding?     No.  8. HORMONE MEDICINES: Are you taking any hormone medicines, prescription or over-the-counter? (e.g., birth control pills, estrogen)     Denies.  9. BLOOD THINNER MEDICINES:  Do you take any blood thinners? (e.g., Coumadin / warfarin, Pradaxa / dabigatran, aspirin)     Denies.  10. CAUSE: What do you think is causing the bleeding? (e.g., recent gyn surgery, recent gyn procedure; known bleeding disorder, cervical cancer, polycystic ovarian disease, fibroids)         Patient states she was told she had residual placenta in October. Pt was placed on antibiotics in Nov.  11. HEMODYNAMIC STATUS: Are you weak or feeling lightheaded? If Yes, ask: Can you stand and walk normally?         Patient denies feeling lightheaded but states she has felt more tired.  12. OTHER SYMPTOMS: What other symptoms are you having with the bleeding? (e.g., passed tissue, vaginal discharge, fever, menstrual-type cramps)       Right lower abdominal pain feels like menstrual cramp.  Protocols used: Vaginal Bleeding - Abnormal-A-AH

## 2023-01-13 ENCOUNTER — Encounter: Payer: Self-pay | Admitting: Family Medicine

## 2023-01-13 LAB — CBC WITH DIFFERENTIAL/PLATELET
Basophils Absolute: 0.1 10*3/uL (ref 0.0–0.1)
Basophils Relative: 1.1 % (ref 0.0–3.0)
Eosinophils Absolute: 0.2 10*3/uL (ref 0.0–0.7)
Eosinophils Relative: 3.9 % (ref 0.0–5.0)
HCT: 39 % (ref 36.0–46.0)
Hemoglobin: 12.5 g/dL (ref 12.0–15.0)
Lymphocytes Relative: 35.6 % (ref 12.0–46.0)
Lymphs Abs: 2 10*3/uL (ref 0.7–4.0)
MCHC: 32 g/dL (ref 30.0–36.0)
MCV: 94.6 fL (ref 78.0–100.0)
Monocytes Absolute: 0.5 10*3/uL (ref 0.1–1.0)
Monocytes Relative: 8.6 % (ref 3.0–12.0)
Neutro Abs: 2.9 10*3/uL (ref 1.4–7.7)
Neutrophils Relative %: 50.8 % (ref 43.0–77.0)
Platelets: 381 10*3/uL (ref 150.0–400.0)
RBC: 4.12 Mil/uL (ref 3.87–5.11)
RDW: 14.5 % (ref 11.5–15.5)
WBC: 5.7 10*3/uL (ref 4.0–10.5)

## 2023-01-13 LAB — IRON,TIBC AND FERRITIN PANEL
%SAT: 26 % (ref 16–45)
Ferritin: 53 ng/mL (ref 16–232)
Iron: 90 ug/dL (ref 40–190)
TIBC: 343 ug/dL (ref 250–450)

## 2023-01-13 LAB — HCG, QUANTITATIVE, PREGNANCY: Quantitative HCG: <0.6 m[IU]/mL

## 2023-02-20 DIAGNOSIS — M549 Dorsalgia, unspecified: Secondary | ICD-10-CM | POA: Diagnosis not present

## 2023-02-20 DIAGNOSIS — M542 Cervicalgia: Secondary | ICD-10-CM | POA: Diagnosis not present

## 2023-02-20 DIAGNOSIS — G8929 Other chronic pain: Secondary | ICD-10-CM | POA: Diagnosis not present

## 2023-02-20 DIAGNOSIS — R7303 Prediabetes: Secondary | ICD-10-CM | POA: Diagnosis not present

## 2023-03-15 ENCOUNTER — Encounter: Payer: Self-pay | Admitting: Family Medicine

## 2023-03-15 ENCOUNTER — Ambulatory Visit (INDEPENDENT_AMBULATORY_CARE_PROVIDER_SITE_OTHER): Admitting: Family Medicine

## 2023-03-15 VITALS — BP 154/86 | HR 85 | Temp 99.2°F | Ht 65.0 in | Wt 229.8 lb

## 2023-03-15 DIAGNOSIS — R899 Unspecified abnormal finding in specimens from other organs, systems and tissues: Secondary | ICD-10-CM

## 2023-03-15 DIAGNOSIS — R030 Elevated blood-pressure reading, without diagnosis of hypertension: Secondary | ICD-10-CM | POA: Diagnosis not present

## 2023-03-15 DIAGNOSIS — N950 Postmenopausal bleeding: Secondary | ICD-10-CM

## 2023-03-15 DIAGNOSIS — R160 Hepatomegaly, not elsewhere classified: Secondary | ICD-10-CM | POA: Diagnosis not present

## 2023-03-15 NOTE — Progress Notes (Signed)
 Established Patient Office Visit   Subjective  Patient ID: [name], female    DOB: December 22, 1975  Age: 48 y.o. MRN: [id number]  Chief Complaint  Patient presents with   Elevated Hepatic Enzymes    Patient was seen at a clinic and had labs done in Nov and liver enzymes were high (58) patient also had a mass seen on [place] in 2018 patient would like to discuss     Patient is a 48 year old female seen for follow-up.  Patient endorses having lab testing done at her husband's job for a discount on insurance in November 2024.  Patient states she was recently told by the nurse there that one of her levels related to liver was elevated at 58.  Patient has been unable to get a copy of the results.  Patient interested in rechecking value.  Patient also presents to follow-up on liver mass thought to have been a hemangioma noted on ultrasound from 2019.  Patient denies RUQ pain.  Does not have a gallbladder.  Patient also notes recent postmenopausal bleeding.  Patient went without menses x 18 months.  Bleeding returned around August 2024.  Seen by OB/GYN, Dr. Layne Benton.  Endometrial biopsy done.  Patient states she received a call stating there was placental tissue in the sample.  Patient started on antibiotics x 2 weeks.  Bleeding stopped but returned when New Year's Eve.  Patient states she contacted office but was not given clear answers regarding neck steps.  Patient had dental procedure this week to shave gum down.  States mouth is hurting which may be why her blood pressure is elevated.  BP typically in the 130s/70s-80.     Patient Active Problem List   Diagnosis Date Noted   Cervical radiculopathy 10/18/2022   Mixed anxiety and depressive disorder 05/31/2018   Moderate asthma 05/31/2018   Cough variant asthma vs UACS 09/25/2017   Pregnancy 07/15/2016   SVD (spontaneous vaginal delivery) 07/15/2016   Maternal age 47+, multigravida, antepartum 01/12/2016   Sciatica 01/12/2016   Gallstone  01/01/2016   Nonspecific abnormal finding in stool contents 10/15/2013   Abdominal pain, epigastric 10/15/2013   Bowel habit changes 10/15/2013   Internal hemorrhoids 10/15/2013   Active labor 02/05/2013   OBESITY 09/02/2008   GERD 09/02/2008   Constipation 09/02/2008   ANAL FISSURE 09/02/2008   Arthropathy 09/02/2008   Past Medical History:  Diagnosis Date   Allergy    Anal fissure    Arthritis    Asthma    Blood in stool    Cholelithiasis    Deciduitis 2024   Depression    GERD (gastroesophageal reflux disease)    Hypertension    Liver hemangioma    Pneumonia    as a child   Pre-diabetes    SVD (spontaneous vaginal delivery) 07/15/2016   Ulcer    Past Surgical History:  Procedure Laterality Date   ANTERIOR CERVICAL DECOMP/DISCECTOMY FUSION N/A 10/18/2022   Procedure: Anterior Cervical Decompression Fusion Cervical five-six, Cervical six-seven, Removal CERVICAL PLATE;  Surgeon: Bedelia Person, MD;  Location[facility] OR;  Service: Neurosurgery;  Laterality: N/A;   CERVICAL DISCECTOMY  03-2008   CHOLECYSTECTOMY N/A 04/21/2017   Procedure: LAPAROSCOPIC CHOLECYSTECTOMY;  Surgeon: Berna Bue, MD;  Location: [facility] OR;  Service: General;  Laterality: N/A;   COLONOSCOPY  2010   WISDOM TOOTH EXTRACTION     Social History   Tobacco Use   Smoking status: Never    Passive exposure: Yes  Smokeless tobacco: Never   Tobacco comments:    grandmother smoked in home as a child  Vaping Use   Vaping status: Never Used  Substance Use Topics   Alcohol use: Yes    Alcohol/week: 3.0 standard drinks of alcohol    Types: 3 Shots of liquor per week    Comment: 3 drinks a weeks   Drug use: No   Family History  Problem Relation Age of Onset   Breast cancer Maternal Grandmother    Ovarian cancer Mother    Diabetes Paternal Grandmother    Hypertension Father    Seizures Father    Colon cancer Neg Hx    Esophageal cancer Neg Hx    Stomach cancer Neg Hx    Kidney disease Neg Hx     Liver disease Neg Hx    Allergies  Allergen Reactions   Hydrocodone Itching   Acetaminophen Hives and Itching   Guaifenesin Hives and Itching   Hydrocodone-Acetaminophen Itching   Iodinated Contrast Media Dermatitis   Pseudoephedrine Hives, Itching and Other (See Comments)    Burning   Shellfish Allergy Hives and Itching   Antihistamines, Chlorpheniramine-Type Anxiety      ROS Negative unless stated above    Objective:     BP (!) 154/86 (BP Location: Left Arm, Patient Position: Sitting, Cuff Size: Large)   Pulse 85   Temp 99.2 F (37.3 C) (Oral)   Ht 5\' 5"  (1.651 m)   Wt 229 lb 12.8 oz (104.2 kg)   LMP 04/10/2020 (Approximate)   SpO2 98%   BMI 38.24 kg/m  BP Readings from Last 3 Encounters:  03/15/23 (!) 154/86  01/12/23 (!) 143/92  10/19/22 110/68   Wt Readings from Last 3 Encounters:  03/15/23 229 lb 12.8 oz (104.2 kg)  01/12/23 230 lb 6.4 oz (104.5 kg)  10/18/22 224 lb (101.6 kg)      Physical Exam Constitutional:      General: She is not in acute distress.    Appearance: Normal appearance.  HENT:     Head: Normocephalic and atraumatic.     Nose: Nose normal.     Mouth/Throat:     Mouth: Mucous membranes are moist.  Cardiovascular:     Rate and Rhythm: Normal rate and regular rhythm.     Heart sounds: Normal heart sounds. No murmur heard.    No gallop.  Pulmonary:     Effort: Pulmonary effort is normal. No respiratory distress.     Breath sounds: Normal breath sounds. No wheezing, rhonchi or rales.  Skin:    General: Skin is warm and dry.  Neurological:     Mental Status: She is alert and oriented to person, place, and time.   No results found for any visits on 03/15/23.    Assessment & Plan:  Liver mass -     US ABDOMEN LIMITED RUQ (LIVER/GB); Future  Abnormal laboratory test -     Comprehensive metabolic panel  Postmenopausal vaginal bleeding  Elevated blood pressure reading without diagnosis of hypertension  Pt currently  asymptomatic.  F/u u/s for possible hemangioma seen on u/s in 2019 ordered.  Recheck CMP.  Pt to monitor bp at home.  For elevations consistently > 140/90 start medication.  Lifestyle modifications encouraged.  Intermittent post menopausal bleeding.  Discussed contacting Ob/Gyn for bxp results and next steps. Records release form signed to obtain info.  Consider 2nd opinion.  Given precautions.  Pt to monitor bp at home and continue lifestyle modifications.  For bp consistently >140/90 start medication.  Return in about 6 weeks (around 04/26/2023), or if symptoms worsen or fail to improve.   Deeann Saint, MD

## 2023-03-16 ENCOUNTER — Ambulatory Visit
Admission: RE | Admit: 2023-03-16 | Discharge: 2023-03-16 | Disposition: A | Discharge status: Home / Self Care | Source: Ambulatory Visit | Attending: Family Medicine | Admitting: Family Medicine

## 2023-03-16 DIAGNOSIS — R160 Hepatomegaly, not elsewhere classified: Secondary | ICD-10-CM

## 2023-03-16 DIAGNOSIS — K760 Fatty (change of) liver, not elsewhere classified: Secondary | ICD-10-CM | POA: Diagnosis not present

## 2023-03-16 LAB — COMPREHENSIVE METABOLIC PANEL
ALT: 51 U/L — ABNORMAL HIGH (ref 0–35)
AST: 25 U/L (ref 0–37)
Albumin: 4.5 g/dL (ref 3.5–5.2)
Alkaline Phosphatase: 99 U/L (ref 39–117)
BUN: 11 mg/dL (ref 6–23)
CO2: 30 meq/L (ref 19–32)
Calcium: 10.1 mg/dL (ref 8.4–10.5)
Chloride: 102 meq/L (ref 96–112)
Creatinine, Ser: 0.87 mg/dL (ref 0.40–1.20)
GFR: 79.78 mL/min (ref >60.00)
Glucose, Bld: 91 mg/dL (ref 70–99)
Potassium: 3.9 meq/L (ref 3.5–5.1)
Sodium: 139 meq/L (ref 135–145)
Total Bilirubin: 0.7 mg/dL (ref 0.2–1.2)
Total Protein: 8.3 g/dL (ref 6.0–8.3)

## 2023-03-19 ENCOUNTER — Encounter: Payer: Self-pay | Admitting: Family Medicine

## 2023-06-14 ENCOUNTER — Emergency Department (HOSPITAL_COMMUNITY)
Admission: EM | Admit: 2023-06-14 | Discharge: 2023-06-15 | Disposition: A | Discharge status: Home / Self Care | Attending: Emergency Medicine | Admitting: Emergency Medicine

## 2023-06-14 ENCOUNTER — Other Ambulatory Visit: Payer: Self-pay

## 2023-06-14 ENCOUNTER — Encounter (HOSPITAL_COMMUNITY): Payer: Self-pay | Admitting: *Deleted

## 2023-06-14 ENCOUNTER — Emergency Department (HOSPITAL_COMMUNITY)

## 2023-06-14 DIAGNOSIS — R051 Acute cough: Secondary | ICD-10-CM | POA: Diagnosis not present

## 2023-06-14 DIAGNOSIS — B349 Viral infection, unspecified: Secondary | ICD-10-CM | POA: Diagnosis not present

## 2023-06-14 DIAGNOSIS — J45909 Unspecified asthma, uncomplicated: Secondary | ICD-10-CM | POA: Insufficient documentation

## 2023-06-14 DIAGNOSIS — I10 Essential (primary) hypertension: Secondary | ICD-10-CM | POA: Insufficient documentation

## 2023-06-14 DIAGNOSIS — R0602 Shortness of breath: Secondary | ICD-10-CM

## 2023-06-14 DIAGNOSIS — R059 Cough, unspecified: Secondary | ICD-10-CM | POA: Diagnosis not present

## 2023-06-14 NOTE — ED Triage Notes (Signed)
 Diff breathing all day and since 2230 her throat was hurting  also  she feels like something in her throat  and she had the same  occurred  several months ago  no temp coughing

## 2023-06-15 ENCOUNTER — Telehealth: Payer: Self-pay

## 2023-06-15 DIAGNOSIS — R0602 Shortness of breath: Secondary | ICD-10-CM | POA: Diagnosis not present

## 2023-06-15 DIAGNOSIS — R059 Cough, unspecified: Secondary | ICD-10-CM | POA: Diagnosis not present

## 2023-06-15 LAB — COMPREHENSIVE METABOLIC PANEL WITH GFR
ALT: 57 U/L — ABNORMAL HIGH (ref 0–44)
AST: 35 U/L (ref 15–41)
Albumin: 3.9 g/dL (ref 3.5–5.0)
Alkaline Phosphatase: 70 U/L (ref 38–126)
Anion gap: 8 (ref 5–15)
BUN: 10 mg/dL (ref 6–20)
CO2: 26 mmol/L (ref 22–32)
Calcium: 9.7 mg/dL (ref 8.9–10.3)
Chloride: 106 mmol/L (ref 98–111)
Creatinine, Ser: 0.87 mg/dL (ref 0.44–1.00)
GFR, Estimated: >60 mL/min (ref >60)
Glucose, Bld: 104 mg/dL — ABNORMAL HIGH (ref 70–99)
Potassium: 3.8 mmol/L (ref 3.5–5.1)
Sodium: 140 mmol/L (ref 135–145)
Total Bilirubin: 0.6 mg/dL (ref 0.0–1.2)
Total Protein: 7.5 g/dL (ref 6.5–8.1)

## 2023-06-15 LAB — URINALYSIS, ROUTINE W REFLEX MICROSCOPIC
Bilirubin Urine: NEGATIVE
Glucose, UA: NEGATIVE mg/dL
Hgb urine dipstick: NEGATIVE
Ketones, ur: NEGATIVE mg/dL
Leukocytes,Ua: NEGATIVE
Nitrite: NEGATIVE
Protein, ur: NEGATIVE mg/dL
Specific Gravity, Urine: >1.03 — ABNORMAL HIGH (ref 1.005–1.030)
pH: 6 (ref 5.0–8.0)

## 2023-06-15 LAB — CBC
HCT: 32.5 % — ABNORMAL LOW (ref 36.0–46.0)
Hemoglobin: 10.6 g/dL — ABNORMAL LOW (ref 12.0–15.0)
MCH: 31.3 pg (ref 26.0–34.0)
MCHC: 32.6 g/dL (ref 30.0–36.0)
MCV: 95.9 fL (ref 80.0–100.0)
Platelets: 288 10*3/uL (ref 150–400)
RBC: 3.39 MIL/uL — ABNORMAL LOW (ref 3.87–5.11)
RDW: 13.6 % (ref 11.5–15.5)
WBC: 6.6 10*3/uL (ref 4.0–10.5)
nRBC: 0 % (ref 0.0–0.2)

## 2023-06-15 LAB — TROPONIN I (HIGH SENSITIVITY)
Troponin I (High Sensitivity): 2 ng/L (ref <18)
Troponin I (High Sensitivity): <2 ng/L (ref <18)

## 2023-06-15 LAB — RESP PANEL BY RT-PCR (RSV, FLU A&B, COVID)  RVPGX2
Influenza A by PCR: NEGATIVE
Influenza B by PCR: NEGATIVE
Resp Syncytial Virus by PCR: NEGATIVE
SARS Coronavirus 2 by RT PCR: NEGATIVE

## 2023-06-15 LAB — LIPASE, BLOOD: Lipase: 30 U/L (ref 11–51)

## 2023-06-15 LAB — GROUP A STREP BY PCR: Group A Strep by PCR: NOT DETECTED

## 2023-06-15 NOTE — Transitions of Care (Post Inpatient/ED Visit) (Signed)
 06/15/2023  Name: [name] MRN: [id number] DOB: May 03, 1975  Today's TOC FU Call Status: Today's TOC FU Call Status:: Successful TOC FU Call Completed Patient's Name and Date of Birth confirmed.  Transition Care Management Follow-up Telephone Call Date of Discharge: 06/14/23 Discharge Facility: [facility]) Type of Discharge: Emergency Department Reason for ED Visit: Other: (SOB, cough) How have you been since you were released from the hospital?: Better (pt reports SOB resolved. still having cough) Any questions or concerns?: No  Items Reviewed: Did you receive and understand the discharge instructions provided?: Yes Medications obtained,verified, and reconciled?: Yes (Medications Reviewed) Any new allergies since your discharge?: No Dietary orders reviewed?: No Do you have support at home?: Yes People in Home [RPT]: spouse  Medications Reviewed Today: Medications Reviewed Today     Reviewed by Diedre Maclellan, CMA (Certified Medical Assistant) on 06/15/23 at 1053  Med List Status: <None>   Medication Order Taking? Sig Documenting Provider Last Dose Status Informant  albuterol  (PROVENTIL  HFA;VENTOLIN  HFA) 108 (90 Base) MCG/ACT inhaler [id number] Yes Inhale 1-2 puffs into the lungs every 6 (six) hours as needed for wheezing or shortness of breath. [provider] Taking Active Self, Pharmacy Records  albuterol  (VENTOLIN  HFA) 108 (90 Base) MCG/ACT inhaler [id number] Yes Inhale 2 puffs into the lungs every 6 (six) hours as needed for wheezing or shortness of breath. Blair, Diane W, FNP Taking Active   B Complex-C (SUPER B COMPLEX PO) [id number] No Take 1 capsule by mouth daily.  Patient not taking: Reported on 06/15/2023   [provider] Not Taking Active Self, Pharmacy Records  baclofen (LIORESAL) 10 MG tablet [id number] Yes Take 10 mg by mouth daily as needed for muscle spasms. [provider] Taking Active Self, Pharmacy Records   budesonide -formoterol  (SYMBICORT ) 80-4.5 MCG/ACT inhaler [id number] Yes Take 2 puffs first thing in am and then another 2 puffs about 12 hours later. Diamond Formica, MD Taking Active Self, Pharmacy Records  diclofenac  sodium (VOLTAREN ) 1 % GEL [id number] Yes Apply 2-4 g topically 4 (four) times daily as needed (for pain.).  [provider] Taking Active Self, Pharmacy Records  docusate sodium  (COLACE) 100 MG capsule [id number] No Take 1 capsule (100 mg total) by mouth 2 (two) times daily.  Patient not taking: Reported on 06/15/2023   Tomlinson, Sara Caylin, PA-C Not Taking Active   fluticasone  (FLONASE ) 50 MCG/ACT nasal spray [id number] Yes Place 1 spray into both nostrils daily. Viola Greulich, MD Taking Active Self, Pharmacy Records  gabapentin  (NEURONTIN ) 600 MG tablet [id number] Yes Take 600 mg by mouth 2 (two) times daily as needed (for pain.).  [provider] Taking Active Self, Pharmacy Records  montelukast  (SINGULAIR ) 10 MG tablet [id number] Yes Take 1 tablet (10 mg total) by mouth daily. Diamond Formica, MD Taking Active Self, Pharmacy Records  Multiple Vitamin (MULTIVITAMIN) tablet [id number] Yes Take 1 tablet by mouth daily. [provider] Taking Active Self, Pharmacy Records  oxyCODONE  (OXY IR/ROXICODONE ) 5 MG immediate release tablet [id number] No Take 1 tablet (5 mg total) by mouth every 4 (four) hours as needed for moderate pain ((score 4 to 6)).  Patient not taking: Reported on 06/15/2023   Tomlinson, Sara Caylin, PA-C Not Taking Active   pantoprazole  (PROTONIX ) 40 MG tablet [id number] Yes TAKE 1 TABLET (40 MG TOTAL) BY MOUTH DAILY. TAKE 30-60 MIN BEFORE FIRST MEAL OF THE DAY Wert, Michael B, MD Taking Active Self, Pharmacy Records  triamcinolone  ointment (KENALOG ) 0.1 % [id number] Yes Apply  1 application topically daily.  [provider] Taking Active Self, Pharmacy Records            Home Care and Equipment/Supplies: Were Home Health Services  Ordered?: No Any new equipment or medical supplies ordered?: No  Functional Questionnaire: Do you need assistance with bathing/showering or dressing?: No Do you need assistance with meal preparation?: No Do you need assistance with eating?: No Do you have difficulty maintaining continence: No Do you need assistance with getting out of bed/getting out of a chair/moving?: No Do you have difficulty managing or taking your medications?: No  Follow up appointments reviewed: PCP Follow-up appointment confirmed?: Yes Date of PCP follow-up appointment?: 06/15/23 Specialist Hospital Follow-up appointment confirmed?: NA Do you need transportation to your follow-up appointment?: No    SIGNATURE: Starlyn Droge, CMA

## 2023-06-15 NOTE — Discharge Instructions (Addendum)
 You were evaluated in the Emergency Department and after careful evaluation, we did not find any emergent condition requiring admission or further testing in the hospital.  Your exam/testing today is overall reassuring.  Your strep test was negative.  Recommend continue use of your inhaler at home as needed, Tylenol  and Motrin  for discomfort.  Please return to the Emergency Department if you experience any worsening of your condition.   Thank you for allowing us  to be a part of your care.

## 2023-06-15 NOTE — ED Provider Notes (Signed)
 [facility] Emergency Department Provider Note MRN:  [id number]  Arrival date & time: 06/15/23     Chief Complaint   Shortness of breath History of Present Illness   [name] is a 48 y.o. year-old female with a history of hypertension, GERD presenting to the ED with chief complaint of shortness of breath.  Sore throat for the past few days.  This evening was trying to soothe her sore throat with salt gargles.  During the gargling immediately began having a coughing fit.  Was really bad, for a while she felt as if she could not take any breaths inward and this concerned her.  Eventually improved, took a puff of her inhaler.  Doing better now but then was nervous to go to bed and have something bad happen, here for evaluation.  Did have some chest discomfort during the coughing fit.  Review of Systems  A thorough review of systems was obtained and all systems are negative except as noted in the HPI and PMH.   Patient's Health History    Past Medical History:  Diagnosis Date   Allergy     Anal fissure    Arthritis    Asthma    Blood in stool    Cholelithiasis    Deciduitis 2024   Depression    GERD (gastroesophageal reflux disease)    Hypertension    Liver hemangioma    Pneumonia    as a child   Pre-diabetes    SVD (spontaneous vaginal delivery) 07/15/2016   Ulcer     Past Surgical History:  Procedure Laterality Date   ANTERIOR CERVICAL DECOMP/DISCECTOMY FUSION N/A 10/18/2022   Procedure: Anterior Cervical Decompression Fusion Cervical five-six, Cervical six-seven, Removal CERVICAL PLATE;  Surgeon: Van Gelinas, MD;  Location[facility] OR;  Service: Neurosurgery;  Laterality: N/A;   CERVICAL DISCECTOMY  03-2008   CHOLECYSTECTOMY N/A 04/21/2017   Procedure: LAPAROSCOPIC CHOLECYSTECTOMY;  Surgeon: Adalberto Acton, MD;  Location: [facility] OR;  Service: General;  Laterality: N/A;   COLONOSCOPY  2010   WISDOM TOOTH EXTRACTION      Family History   Problem Relation Age of Onset   Breast cancer Maternal Grandmother    Ovarian cancer Mother    Diabetes Paternal Grandmother    Hypertension Father    Seizures Father    Colon cancer Neg Hx    Esophageal cancer Neg Hx    Stomach cancer Neg Hx    Kidney disease Neg Hx    Liver disease Neg Hx     Social History   Socioeconomic History   Marital status: Married    Spouse name: Not on file   Number of children: Not on file   Years of education: Not on file   Highest education level: Not on file  Occupational History   Not on file  Tobacco Use   Smoking status: Never    Passive exposure: Yes   Smokeless tobacco: Never   Tobacco comments:    grandmother smoked in home as a child  Vaping Use   Vaping status: Never Used  Substance and Sexual Activity   Alcohol use: Yes    Alcohol/week: 3.0 standard drinks of alcohol    Types: 3 Shots of liquor per week    Comment: 3 drinks a weeks   Drug use: No   Sexual activity: Yes  Other Topics Concern   Not on file  Social History Narrative   Not on file   Social Drivers of  Health   Financial Resource Strain: Not on file  Food Insecurity: Not on file  Transportation Needs: No Transportation Needs (10/20/2022)   PRAPARE - Administrator, Civil Service (Medical): No    Lack of Transportation (Non-Medical): No  Physical Activity: Not on file  Stress: Not on file  Social Connections: Not on file  Intimate Partner Violence: Not on file     Physical Exam   Vitals:   06/15/23 0211 06/15/23 0300  BP: 116/83 (!) 147/92  Pulse: 85 87  Resp: 20 20  Temp: 98.7 F (37.1 C)   SpO2: 94% 96%    CONSTITUTIONAL: Well-appearing, NAD NEURO/PSYCH:  Alert and oriented x 3, no focal deficits EYES:  eyes equal and reactive ENT/NECK:  no LAD, no JVD CARDIO: Regular rate, well-perfused, normal S1 and S2 PULM:  CTAB no wheezing or rhonchi GI/GU:  non-distended, non-tender MSK/SPINE:  No gross deformities, no edema SKIN:  no  rash, atraumatic   *Additional and/or pertinent findings included in MDM below  Diagnostic and Interventional Summary    EKG Interpretation Date/Time:  Wednesday June 14 2023 23:39:39 EDT Ventricular Rate:  102 PR Interval:  192 QRS Duration:  72 QT Interval:  330 QTC Calculation: 430 R Axis:   32  Text Interpretation: Sinus tachycardia Anteroseptal infarct , age undetermined Abnormal ECG When compared with ECG of 06-Oct-2022 08:52, PREVIOUS ECG IS PRESENT Confirmed by Gwenetta Lennert [phone]) on 06/15/2023 2:50:40 AM       Labs Reviewed  COMPREHENSIVE METABOLIC PANEL WITH GFR - Abnormal; Notable for the following components:      Result Value   Glucose, Bld 104 (*)    ALT 57 (*)    All other components within normal limits  CBC - Abnormal; Notable for the following components:   RBC 3.39 (*)    Hemoglobin 10.6 (*)    HCT 32.5 (*)    All other components within normal limits  URINALYSIS, ROUTINE W REFLEX MICROSCOPIC - Abnormal; Notable for the following components:   Specific Gravity, Urine >1.030 (*)    All other components within normal limits  RESP PANEL BY RT-PCR (RSV, FLU A&B, COVID)  [id number]  GROUP A STREP BY PCR  LIPASE, BLOOD  TROPONIN I (HIGH SENSITIVITY)  TROPONIN I (HIGH SENSITIVITY)    DG Chest 2 View  Final Result      Medications - No data to display   Procedures  /  Critical Care Procedures  ED Course and Medical Decision Making  Initial Impression and Ddx Suspect viral illness versus strep throat, patient possibly had a aspiration event leading to coughing fit, possibly a laryngeal spasm.  Doing much better now with clear lung sounds, no stridor, no rhonchi, no wheezing, vitals are normal, no evidence of DVT, highly doubt PE.  Chest pain favored to be related to the cough, doubt cardiac cause.  Past medical/surgical history that increases complexity of ED encounter: None  Interpretation of Diagnostics I personally reviewed the EKG and my  interpretation is as follows: Sinus tachycardia  No significant blood count or electrolyte disturbance.  Troponin negative x 2  Patient Reassessment and Ultimate Disposition/Management     With reassuring exam and workup patient is appropriate for discharge with reassurance.  Patient management required discussion with the following services or consulting groups:  None  Complexity of Problems Addressed Acute illness or injury that poses threat of life of bodily function  Additional Data Reviewed and Analyzed Further history obtained from: Further history  from spouse/family member  Additional Factors Impacting ED Encounter Risk None  Merrick Abe. Harless Lien, MD [facility] Emergency Medicine [facility] [email or web address]  Final Clinical Impressions(s) / ED Diagnoses     ICD-10-CM   1. Acute cough  R05.1     2. Viral illness  B34.9     3. Shortness of breath  R06.02       ED Discharge Orders     None        Discharge Instructions Discussed with and Provided to Patient:     Discharge Instructions      You were evaluated in the Emergency Department and after careful evaluation, we did not find any emergent condition requiring admission or further testing in the hospital.  Your exam/testing today is overall reassuring.  Your strep test was negative.  Recommend continue use of your inhaler at home as needed, Tylenol  and Motrin  for discomfort.  Please return to the Emergency Department if you experience any worsening of your condition.   Thank you for allowing us  to be a part of your care.     Edson Graces, MD 06/15/23 [phone]

## 2023-06-16 ENCOUNTER — Inpatient Hospital Stay: Admitting: Family Medicine

## 2023-06-16 ENCOUNTER — Ambulatory Visit: Admitting: Family Medicine

## 2023-06-16 ENCOUNTER — Encounter: Payer: Self-pay | Admitting: Family Medicine

## 2023-06-16 VITALS — BP 142/84 | HR 105 | Temp 98.7°F | Ht 65.0 in | Wt 225.2 lb

## 2023-06-16 DIAGNOSIS — R509 Fever, unspecified: Secondary | ICD-10-CM | POA: Diagnosis not present

## 2023-06-16 DIAGNOSIS — R0602 Shortness of breath: Secondary | ICD-10-CM | POA: Diagnosis not present

## 2023-06-16 DIAGNOSIS — R051 Acute cough: Secondary | ICD-10-CM | POA: Diagnosis not present

## 2023-06-16 DIAGNOSIS — J0110 Acute frontal sinusitis, unspecified: Secondary | ICD-10-CM

## 2023-06-16 DIAGNOSIS — Z8709 Personal history of other diseases of the respiratory system: Secondary | ICD-10-CM

## 2023-06-16 DIAGNOSIS — R490 Dysphonia: Secondary | ICD-10-CM

## 2023-06-16 DIAGNOSIS — R1310 Dysphagia, unspecified: Secondary | ICD-10-CM

## 2023-06-16 LAB — POC COVID19 BINAXNOW: SARS Coronavirus 2 Ag: NEGATIVE

## 2023-06-16 MED ORDER — AMOXICILLIN-POT CLAVULANATE 500-125 MG PO TABS
1.0000 | ORAL_TABLET | Freq: Two times a day (BID) | ORAL | 0 refills | Status: AC
Start: 2023-06-16 — End: 2023-06-23

## 2023-06-16 MED ORDER — BENZONATATE 100 MG PO CAPS: 100.0000 mg | ORAL_CAPSULE | Freq: Two times a day (BID) | ORAL | 0 refills | Status: DC | PRN

## 2023-06-16 NOTE — Progress Notes (Signed)
 Established Patient Office Visit   Subjective  Patient ID: [name], female    DOB: 03-Jul-1975  Age: 48 y.o. MRN: [id number]  Chief Complaint  Patient presents with   Medical Management of Chronic Issues    Hospital follow-up seen 6/4 for cough and SOB, patient now having cough, SOB, wheezing, lost of voice, fever, chills, body aches, yellow mucus with blood,     Patient is a 48 year old female seen for ED follow-up.  Patient seen in ED on 06/14/2023 for cough and shortness of breath thought 2/2 viral illness.  Patient endorses continued symptoms including decreased appetite, chills, body aches, hoarseness, fatigue, nausea with some improvement this morning.  Gargling with warm salt water.  Patient was prompted to go to the ED as she could not get a breath in.  Albuterol  inhaler helped slightly.  Patient notes hoarseness is not new.  Patient with hoarseness and dysphagia since anterior cervical decompression fusion cervical 5-6, 6-7 with cervical plate removal 40/09/8117.  Voice has been more raspy.  Becomes worse at the end of the day.  Patient had no issues after prior neck surgery at the [place] in [place].  Most recent surgery done by Dr. Arvilla Birmingham, neurosurgery.  Patient was advised to see a speech therapist for evaluation.    Patient Active Problem List   Diagnosis Date Noted   Cervical radiculopathy 10/18/2022   Mixed anxiety and depressive disorder 05/31/2018   Moderate asthma 05/31/2018   Cough variant asthma vs UACS 09/25/2017   Pregnancy 07/15/2016   SVD (spontaneous vaginal delivery) 07/15/2016   Maternal age 26+, multigravida, antepartum 01/12/2016   Sciatica 01/12/2016   Gallstone 01/01/2016   Nonspecific abnormal finding in stool contents 10/15/2013   Abdominal pain, epigastric 10/15/2013   Bowel habit changes 10/15/2013   Internal hemorrhoids 10/15/2013   Active labor 02/05/2013   OBESITY 09/02/2008   GERD 09/02/2008   Constipation 09/02/2008   ANAL FISSURE  09/02/2008   Arthropathy 09/02/2008   Past Medical History:  Diagnosis Date   Allergy     Anal fissure    Arthritis    Asthma    Blood in stool    Cholelithiasis    Deciduitis 2024   Depression    GERD (gastroesophageal reflux disease)    Hypertension    Liver hemangioma    Pneumonia    as a child   Pre-diabetes    SVD (spontaneous vaginal delivery) 07/15/2016   Ulcer    Past Surgical History:  Procedure Laterality Date   ANTERIOR CERVICAL DECOMP/DISCECTOMY FUSION N/A 10/18/2022   Procedure: Anterior Cervical Decompression Fusion Cervical five-six, Cervical six-seven, Removal CERVICAL PLATE;  Surgeon: Van Gelinas, MD;  Location: [facility] OR;  Service: Neurosurgery;  Laterality: N/A;   CERVICAL DISCECTOMY  03-2008   CHOLECYSTECTOMY N/A 04/21/2017   Procedure: LAPAROSCOPIC CHOLECYSTECTOMY;  Surgeon: Adalberto Acton, MD;  Location: [facility] OR;  Service: General;  Laterality: N/A;   COLONOSCOPY  2010   WISDOM TOOTH EXTRACTION     Social History   Tobacco Use   Smoking status: Never    Passive exposure: Yes   Smokeless tobacco: Never   Tobacco comments:    grandmother smoked in home as a child  Vaping Use   Vaping status: Never Used  Substance Use Topics   Alcohol use: Yes    Alcohol/week: 3.0 standard drinks of alcohol    Types: 3 Shots of liquor per week    Comment: 3 drinks a weeks   Drug use:  No   Family History  Problem Relation Age of Onset   Breast cancer Maternal Grandmother    Ovarian cancer Mother    Diabetes Paternal Grandmother    Hypertension Father    Seizures Father    Colon cancer Neg Hx    Esophageal cancer Neg Hx    Stomach cancer Neg Hx    Kidney disease Neg Hx    Liver disease Neg Hx    Allergies  Allergen Reactions   Hydrocodone Itching   Acetaminophen  Hives and Itching   Guaifenesin Hives and Itching   Hydrocodone-Acetaminophen  Itching   Iodinated Contrast Media Dermatitis   Pseudoephedrine Hives, Itching and Other (See Comments)     Burning   Shellfish Allergy  Hives and Itching   Antihistamines, Chlorpheniramine-Type Anxiety    ROS Negative unless stated above    Objective:      BP (!) 142/84 (BP Location: Right Arm, Patient Position: Sitting, Cuff Size: Normal)   Pulse (!) 105   Temp 98.7 F (37.1 C) (Oral)   Ht 5\' 5"  (1.651 m)   Wt 225 lb 3.2 oz (102.2 kg)   LMP 04/10/2020 (Approximate)   SpO2 96%   BMI 37.48 kg/m  BP Readings from Last 3 Encounters:  06/16/23 (!) 142/84  06/15/23 128/81  03/15/23 (!) 154/86   Wt Readings from Last 3 Encounters:  06/16/23 225 lb 3.2 oz (102.2 kg)  06/14/23 229 lb 11.5 oz (104.2 kg)  03/15/23 229 lb 12.8 oz (104.2 kg)      Physical Exam Constitutional:      General: She is not in acute distress.    Appearance: Normal appearance.  HENT:     Head: Normocephalic and atraumatic.     Nose:     Right Sinus: Frontal sinus tenderness present.     Mouth/Throat:     Mouth: Mucous membranes are moist.     Pharynx: Posterior oropharyngeal erythema present. No oropharyngeal exudate.     Comments: Hoarse voice. Cardiovascular:     Rate and Rhythm: Normal rate and regular rhythm.     Heart sounds: Normal heart sounds. No murmur heard.    No gallop.  Pulmonary:     Effort: Pulmonary effort is normal. No respiratory distress.     Breath sounds: Normal breath sounds. No wheezing, rhonchi or rales.     Comments: Cough. Skin:    General: Skin is warm and dry.  Neurological:     Mental Status: She is alert and oriented to person, place, and time.     Results for orders placed or performed in visit on 06/16/23  POC COVID-19 BinaxNow  Result Value Ref Range   SARS Coronavirus 2 Ag Negative Negative      Assessment & Plan:   Acute frontal sinusitis, recurrence not specified -     Amoxicillin -Pot Clavulanate; Take 1 tablet by mouth in the morning and at bedtime for 7 days.  Dispense: 14 tablet; Refill: 0  Acute cough -     POC COVID-19 BinaxNow -      Benzonatate ; Take 1 capsule (100 mg total) by mouth 2 (two) times daily as needed for cough.  Dispense: 20 capsule; Refill: 0  Fever, unspecified fever cause  SOB (shortness of breath) -     Albuterol  Sulfate HFA; Inhale 2 puffs into the lungs every 6 (six) hours as needed for wheezing or shortness of breath.  Dispense: 8 g; Refill: 2  Hoarse voice quality  Dysphagia, unspecified type  History of asthma -  Albuterol  Sulfate HFA; Inhale 2 puffs into the lungs every 6 (six) hours as needed for wheezing or shortness of breath.  Dispense: 8 g; Refill: 2   Patient with acute URI symptoms likely viral.  POC COVID testing negative.  CXR from ED on 06/14/2023 negative.  Start ABX for frontal sinusitis.  Tessalon  for cough.  Albuterol  inhaler refilled.  Concern dysphagia and ongoing hoarseness and voice likely from vocal cord paralysis.  May have also contributed to recent inability to inhale.  Nerve injury possible from recent ACDF.  Patient will proceed with speech therapy.  Advised to consider ENT follow-up.  Will place referral if needed.  Patient also seen at the [place].  Return if symptoms worsen or fail to improve.   Viola Greulich, MD

## 2023-06-19 MED ORDER — ALBUTEROL SULFATE HFA 108 (90 BASE) MCG/ACT IN AERS: 2.0000 | INHALATION_SPRAY | Freq: Four times a day (QID) | RESPIRATORY_TRACT | 2 refills | Status: AC | PRN

## 2023-07-10 ENCOUNTER — Other Ambulatory Visit (HOSPITAL_COMMUNITY): Payer: Self-pay | Admitting: *Deleted

## 2023-07-10 DIAGNOSIS — R1310 Dysphagia, unspecified: Secondary | ICD-10-CM

## 2023-07-31 ENCOUNTER — Ambulatory Visit (HOSPITAL_COMMUNITY)
Admission: RE | Admit: 2023-07-31 | Discharge: 2023-07-31 | Disposition: A | Discharge status: Home / Self Care | Payer: Self-pay | Source: Ambulatory Visit | Attending: Family Medicine | Admitting: Family Medicine

## 2023-07-31 DIAGNOSIS — R1310 Dysphagia, unspecified: Secondary | ICD-10-CM

## 2023-07-31 NOTE — Evaluation (Signed)
 Modified Barium Swallow Study  Patient Details  Name: [name] MRN: [id number] Date of Birth: 03/06/1975  Today's Date: 07/31/2023  Modified Barium Swallow completed.  Full report located under Chart Review in the Imaging Section.  History of Present Illness [name] is a 48 yo F who presents for a MBSS following several months of changes to voice and difficulty swallowing.  She reports changes noted following ACDF surgery in October.  She has had some suspected aspiration events while gargling salt water.   Clinical Impression Pt presents with oropharyngeal swallowing within functional limits.  There was no penetation or aspiration of any consistencies trialed.  Swallow intiation at the level of the vallecula. There was occasional trace-mild oral residue which pt cleared with spontaneous subsequent swallow.  There was trace pyriform residue intermittently from backflow from cervical esophagus.  This appears related to cervical hardware which creates a shelf-like protrusion into esophagus (see image below).  Majority of bolus was able to pass, but this may contribute to the sensation pt reports of having a lump in her throat.  On esophageal sweep there was stasis of barium tablet retrosternally.  This was cleared by following with a bolus of puree.    Recommend continuing regular texture diet with thin liquid.  Pt may want to choose softer foods as needed.  Consider taking pills with puree.   Suggest avoiding gargling if this is a trigger for episodes of shortness of breath.  This may be 2/2 aspiration, or may trigger laryngospasms.   Recommend ENT consult for vocal changes and further assessment of vocal folds.  DIGEST Swallow Severity Rating*  Safety: 0  Efficiency: 0  Overall Pharyngeal Swallow Severity: 0 1: mild; 2: moderate; 3: severe; 4: profound  *The Dynamic Imaging Grade of Swallowing Toxicity is standardized for the head and neck cancer population, however,  demonstrates promising clinical applications across populations to standardize the clinical rating of pharyngeal swallow safety and severity.   Cervical hardware impinging on esophagus:   Factors that may increase risk of adverse event in presence of aspiration Noe & Lianne 2021):  (None)  Swallow Evaluation Recommendations Recommendations: PO diet PO Diet Recommendation: Regular;Thin liquids (Level 0) Liquid Administration via: Cup;Straw Medication Administration: Whole meds with liquid (or puree) Supervision: Patient able to self-feed Swallowing strategies  : Slow rate;Small bites/sips Postural changes: Position pt fully upright for meals;Stay upright 30-60 min after meals Oral care recommendations: Oral care BID (2x/day) Recommended consults: Consider ENT consultation (Consider NSGY consult if cerival hardware remains a problem)      Anette FORBES Grippe, MA, CCC-SLP Acute Rehabilitation Services Office: [phone] 07/31/2023,12:31 PM

## 2023-09-18 ENCOUNTER — Ambulatory Visit: Payer: Self-pay

## 2023-09-18 NOTE — Telephone Encounter (Signed)
 FYI Only or Action Required?: FYI only for provider.  Patient was last seen in primary care on 06/16/2023 by Mercer Clotilda SAUNDERS, MD.  Called Nurse Triage reporting Abdominal Pain.  Symptoms began x 3 weeks of bloating.  Interventions attempted: Nothing.  Symptoms are: gradually worsening.  Triage Disposition: See PCP When Office is Open (Within 3 Days)  Patient/caregiver understands and will follow disposition?: Yes  Copied from CRM [id number]. Topic: Clinical - Red Word Triage >> Sep 18, 2023  4:50 PM Jasmin G wrote: [facility] that prompted transfer to Nurse Triage: Pt states that she has been experiencing abdominal pain and bloating for weeks, accompanied by nausea, cramps and blood on stool, she also mentioned that she always feels full. Reason for Disposition  [1] Few streaks of blood in vomit AND [2] occurred one time  Answer Assessment - Initial Assessment Questions 1. LOCATION: Where does it hurt?      Upper right Abd and travels down to right of navel 2. RADIATION: Does the pain shoot anywhere else? (e.g., chest, back)     Yes radiates 3. ONSET: When did the pain begin? (e.g., minutes, hours or days ago)      X3  weeks of bloating & yesterday nausea started & abd 4. SUDDEN: Gradual or sudden onset?     sudden 5. PATTERN Does the pain come and go, or is it constant?      constant 6. SEVERITY: How bad is the pain?  (e.g., Scale 1-10; mild, moderate, or severe)     8/10 7. RECURRENT SYMPTOM: Have you ever had this type of stomach pain before? If Yes, ask: When was the last time? and What happened that time?      no 8. CAUSE: What do you think is causing the stomach pain? (e.g., gallstones, recent abdominal surgery)    unknown 9. RELIEVING/AGGRAVATING FACTORS: What makes it better or worse? (e.g., antacids, bending or twisting motion, bowel movement)     no 10. OTHER SYMPTOMS: Do you have any other symptoms? (e.g., back pain, diarrhea, fever, urination  pain, vomiting)       Blood in stool - dark red no clots, right thigh on back pain, nausea cramp 11. PREGNANCY: Is there any chance you are pregnant? When was your last menstrual period?       Na  Pt states she is very bloated.  Protocols used: Abdominal Pain - Female-A-AH

## 2023-09-19 ENCOUNTER — Ambulatory Visit: Payer: Self-pay | Admitting: Family Medicine

## 2023-09-19 ENCOUNTER — Encounter: Payer: Self-pay | Admitting: Family Medicine

## 2023-09-19 VITALS — BP 110/80 | HR 86 | Temp 97.9°F | Wt 228.9 lb

## 2023-09-19 DIAGNOSIS — R140 Abdominal distension (gaseous): Secondary | ICD-10-CM | POA: Diagnosis not present

## 2023-09-19 DIAGNOSIS — D649 Anemia, unspecified: Secondary | ICD-10-CM | POA: Diagnosis not present

## 2023-09-19 LAB — CBC WITH DIFFERENTIAL/PLATELET
Basophils Absolute: 0 K/uL (ref 0.0–0.1)
Basophils Relative: 0.4 % (ref 0.0–3.0)
Eosinophils Absolute: 0.1 K/uL (ref 0.0–0.7)
Eosinophils Relative: 1.6 % (ref 0.0–5.0)
HCT: 36.8 % (ref 36.0–46.0)
Hemoglobin: 12 g/dL (ref 12.0–15.0)
Lymphocytes Relative: 34.6 % (ref 12.0–46.0)
Lymphs Abs: 1.4 K/uL (ref 0.7–4.0)
MCHC: 32.5 g/dL (ref 30.0–36.0)
MCV: 88.8 fl (ref 78.0–100.0)
Monocytes Absolute: 0.3 K/uL (ref 0.1–1.0)
Monocytes Relative: 7.3 % (ref 3.0–12.0)
Neutro Abs: 2.3 K/uL (ref 1.4–7.7)
Neutrophils Relative %: 56.1 % (ref 43.0–77.0)
Platelets: 316 K/uL (ref 150.0–400.0)
RBC: 4.14 Mil/uL (ref 3.87–5.11)
RDW: 13.8 % (ref 11.5–15.5)
WBC: 4.1 K/uL (ref 4.0–10.5)

## 2023-09-19 LAB — FOLATE: Folate: 12.2 ng/mL (ref >5.9)

## 2023-09-19 LAB — IRON,TIBC AND FERRITIN PANEL
%SAT: 16 % (ref 16–45)
Ferritin: 22 ng/mL (ref 16–232)
Iron: 61 ug/dL (ref 40–190)
TIBC: 372 ug/dL (ref 250–450)

## 2023-09-19 LAB — VITAMIN B12: Vitamin B-12: 489 pg/mL (ref 211–911)

## 2023-09-19 NOTE — Telephone Encounter (Signed)
 Called patient to follow-up, left a VM to return call

## 2023-09-19 NOTE — Progress Notes (Signed)
 Established Patient Office Visit  Subjective   Patient ID: [name], female    DOB: 06-11-1975  Age: 48 y.o. MRN: [id number]  Chief Complaint  Patient presents with   Abdominal Pain    HPI   [name] is seen today as a work in with at least 2-week history of increased abdominal bloating.  She states she felt very much the same back in 2010 when she was diagnosed with H. pylori.  She was treated at that time (antibiotics and PPI) but does not recall any test of cure.  She had colonoscopy 2019 with no polyps.  She has had previous cholecystectomy.  She denies any recent appetite change or weight changes.  She has had some intermittent abdominal spotting and has seen gynecologist.  She states about 1 year ago she had ultrasound and uterine biopsy.  Her mom died of ovarian cancer.  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  She has pending appointment with ENT.  Patient relates recent limited ultrasound right upper quadrant March 2025 which was unremarkable.  Previous cholecystectomy noted.  She had barium swallow study back in July.  She has had history of ACDF surgery in October and it was felt that her cervical hardware was creating a shelflike protrusion into the esophagus which may have been causing some issues.  She continues to take Protonix .  She has pending follow-up with GI in October.  She does have chronic anemia which has been normocytic.  She states this has really not been discussed recently.  She is not aware of any obvious bleeding other than occasional vaginal spotting which has been worked up by [employer].  No recent melena.  No hematemesis.  Past Medical History:  Diagnosis Date   Allergy     Anal fissure    Arthritis    Asthma    Blood in stool    Cholelithiasis    Deciduitis 2024   Depression    GERD (gastroesophageal reflux disease)    Hypertension    Liver hemangioma    Pneumonia    as a child   Pre-diabetes    SVD  (spontaneous vaginal delivery) 07/15/2016   Ulcer    Past Surgical History:  Procedure Laterality Date   ANTERIOR CERVICAL DECOMP/DISCECTOMY FUSION N/A 10/18/2022   Procedure: Anterior Cervical Decompression Fusion Cervical five-six, Cervical six-seven, Removal CERVICAL PLATE;  Surgeon: Debby Dorn MATSU, MD;  Location: [facility] OR;  Service: Neurosurgery;  Laterality: N/A;   CERVICAL DISCECTOMY  03-2008   CHOLECYSTECTOMY N/A 04/21/2017   Procedure: LAPAROSCOPIC CHOLECYSTECTOMY;  Surgeon: Signe Mitzie LABOR, MD;  Location: [facility] OR;  Service: General;  Laterality: N/A;   COLONOSCOPY  2010   WISDOM TOOTH EXTRACTION      reports that she has never smoked. She has been exposed to tobacco smoke. She has never used smokeless tobacco. She reports current alcohol use of about 3.0 standard drinks of alcohol per week. She reports that she does not use drugs. family history includes Breast cancer in her maternal grandmother; Diabetes in her paternal grandmother; Hypertension in her father; Ovarian cancer in her mother; Seizures in her father. Allergies  Allergen Reactions   Hydrocodone Itching   Acetaminophen  Hives and Itching   Guaifenesin Hives and Itching   Hydrocodone-Acetaminophen  Itching   Iodinated Contrast Media Dermatitis   Pseudoephedrine Hives, Itching and Other (See Comments)    Burning   Shellfish Allergy  Hives and Itching   Antihistamines, Chlorpheniramine-Type Anxiety    Review of  Systems  Constitutional:  Negative for chills, fever and weight loss.  Respiratory:  Negative for cough and shortness of breath.   Cardiovascular:  Negative for chest pain.  Gastrointestinal:  Positive for abdominal pain. Negative for blood in stool, constipation, diarrhea, heartburn, nausea and vomiting.  Genitourinary:  Negative for dysuria.      Objective:     BP 110/80   Pulse 86   Temp 97.9 F (36.6 C) (Oral)   Wt 228 lb 14.4 oz (103.8 kg)   LMP 04/10/2020 (Approximate)   SpO2 97%   BMI 38.09  kg/m  BP Readings from Last 3 Encounters:  09/19/23 110/80  06/16/23 (!) 142/84  06/15/23 128/81   Wt Readings from Last 3 Encounters:  09/19/23 228 lb 14.4 oz (103.8 kg)  06/16/23 225 lb 3.2 oz (102.2 kg)  06/14/23 229 lb 11.5 oz (104.2 kg)      Physical Exam Vitals reviewed.  Constitutional:      General: She is not in acute distress.    Appearance: She is not ill-appearing.  Cardiovascular:     Rate and Rhythm: Normal rate and regular rhythm.  Abdominal:     Comments: Abdomen is nondistended.  Normal bowel sounds.  Soft with some mild tenderness just right of the umbilicus region.  No guarding or rebound.  No masses palpated.  No splenomegaly or hepatomegaly noted  Neurological:     Mental Status: She is alert.      No results found for any visits on 09/19/23.    The 10-year ASCVD risk score (Arnett DK, et al., 2019) is: 0.7%    Assessment & Plan:   Problem List Items Addressed This Visit   None Visit Diagnoses       Abdominal bloating    -  Primary   Relevant Orders   Helicobacter Pylori Special Antigen, Stool     Normocytic anemia       Relevant Orders   CBC with Differential/Platelet   Vitamin B12   Folate   Iron, TIBC and Ferritin Panel     Patient relates a couple week history of increased abdominal bloating.  No known history of gastroparesis.  Past history of H. pylori and states she felt very similar back then.  She was treated about 15 years ago.  Will recheck stool antigen for H. pylori.  Also check additional labs as above including CBC, B12, folate, iron studies with history of her chronic anemia  She has pending follow-up with GI in October.  If abdominal bloating which is postprandial persists, consider possible gastric emptying study at some point  No follow-ups on file.    Wolm Scarlet, MD

## 2023-09-19 NOTE — Telephone Encounter (Signed)
 Copied from CRM [id number]. Topic: General - Call Back - No Documentation >> Sep 19, 2023 12:50 PM Mercedes MATSU wrote: Reason for CRM: Patient called in stating she had a missed call and it was no documentation in the chart. Patient requesting call back and can be reached at [phone].

## 2023-09-20 LAB — HELICOBACTER PYLORI  SPECIAL ANTIGEN
MICRO NUMBER:: 16942998
RESULT:: DETECTED — AB
SPECIMEN QUALITY: ADEQUATE

## 2023-09-20 NOTE — Telephone Encounter (Signed)
 Seen in office 9/9

## 2023-09-21 ENCOUNTER — Encounter: Payer: Self-pay | Admitting: Family Medicine

## 2023-09-21 ENCOUNTER — Ambulatory Visit: Admitting: Family Medicine

## 2023-09-21 VITALS — BP 120/88 | HR 79 | Temp 98.4°F | Ht 65.0 in | Wt 229.2 lb

## 2023-09-21 DIAGNOSIS — A048 Other specified bacterial intestinal infections: Secondary | ICD-10-CM | POA: Diagnosis not present

## 2023-09-21 DIAGNOSIS — K429 Umbilical hernia without obstruction or gangrene: Secondary | ICD-10-CM

## 2023-09-21 DIAGNOSIS — Z981 Arthrodesis status: Secondary | ICD-10-CM

## 2023-09-21 DIAGNOSIS — J392 Other diseases of pharynx: Secondary | ICD-10-CM

## 2023-09-21 DIAGNOSIS — K5790 Diverticulosis of intestine, part unspecified, without perforation or abscess without bleeding: Secondary | ICD-10-CM | POA: Diagnosis not present

## 2023-09-21 MED ORDER — BISMUTH/METRONIDAZ/TETRACYCLIN 140-125-125 MG PO CAPS: 1.0000 | ORAL_CAPSULE | Freq: Four times a day (QID) | ORAL | 0 refills | Status: DC

## 2023-09-21 MED ORDER — PANTOPRAZOLE SODIUM 40 MG PO TBEC: 40.0000 mg | DELAYED_RELEASE_TABLET | Freq: Two times a day (BID) | ORAL | 0 refills | Status: AC

## 2023-09-21 NOTE — Progress Notes (Signed)
 Established Patient Office Visit   Subjective  Patient ID: [name], female    DOB: Feb 21, 1975  Age: 48 y.o. MRN: [id number]  Chief Complaint  Patient presents with   Acute Visit    Patient came in today for a follow-up for abdminal bloating and blood in the stool, started 3 weeks ago, patient was seen 9/8 and is pos for H. Pylori     Pt is a 48 yo female seen for f/u.  Last weekend patient developed abdominal pain and bloating after eating chicken.  After thinking she needed to have a BM, she noticed blood in toilet and then developed sharp abdominal pain.  Pt seen in office 9/9 for abd bloating and discomfort similar to previous episode of H. Pylori.  Stool studies positive here.  The day prior 09/18/23, pt seen at [facility].  Stool studies and labs there were negative. A CT abd pelv with contrast with diverticulosis, subcentimeter hypodense bilateral renal lesions too small to characterize, likely benign, and a small fat-containing umbilical hernia.  Follow-up withGI not until late October.  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  A biopsy of the mass is scheduled in 2 weeks.  Patient seen neurosurgery for history of complications status post a second cervical fusion.      Patient Active Problem List   Diagnosis Date Noted   Cervical radiculopathy 10/18/2022   Mixed anxiety and depressive disorder 05/31/2018   Moderate asthma 05/31/2018   Cough variant asthma vs UACS 09/25/2017   Pregnancy 07/15/2016   SVD (spontaneous vaginal delivery) 07/15/2016   Maternal age 51+, multigravida, antepartum 01/12/2016   Sciatica 01/12/2016   Gallstone 01/01/2016   Nonspecific abnormal finding in stool contents 10/15/2013   Abdominal pain, epigastric 10/15/2013   Bowel habit changes 10/15/2013   Internal hemorrhoids 10/15/2013   Active labor 02/05/2013   OBESITY 09/02/2008   GERD 09/02/2008    Constipation 09/02/2008   ANAL FISSURE 09/02/2008   Arthropathy 09/02/2008   Past Medical History:  Diagnosis Date   Allergy     Anal fissure    Arthritis    Asthma    Blood in stool    Cholelithiasis    Deciduitis 2024   Depression    GERD (gastroesophageal reflux disease)    Hypertension    Liver hemangioma    Pneumonia    as a child   Pre-diabetes    SVD (spontaneous vaginal delivery) 07/15/2016   Ulcer    Past Surgical History:  Procedure Laterality Date   ANTERIOR CERVICAL DECOMP/DISCECTOMY FUSION N/A 10/18/2022   Procedure: Anterior Cervical Decompression Fusion Cervical five-six, Cervical six-seven, Removal CERVICAL PLATE;  Surgeon: Debby Dorn MATSU, MD;  Location: [facility] OR;  Service: Neurosurgery;  Laterality: N/A;   CERVICAL DISCECTOMY  03-2008   CHOLECYSTECTOMY N/A 04/21/2017   Procedure: LAPAROSCOPIC CHOLECYSTECTOMY;  Surgeon: Signe Mitzie LABOR, MD;  Location: [facility] OR;  Service: General;  Laterality: N/A;   COLONOSCOPY  2010   WISDOM TOOTH EXTRACTION     Social History   Tobacco Use   Smoking status: Never    Passive exposure: Yes   Smokeless tobacco: Never   Tobacco comments:    grandmother smoked in home as a child  Vaping Use   Vaping status: Never Used  Substance Use Topics   Alcohol use: Yes    Alcohol/week: 3.0 standard drinks of alcohol    Types: 3 Shots of liquor per  week    Comment: 3 drinks a weeks   Drug use: No   Family History  Problem Relation Age of Onset   Breast cancer Maternal Grandmother    Ovarian cancer Mother    Diabetes Paternal Grandmother    Hypertension Father    Seizures Father    Colon cancer Neg Hx    Esophageal cancer Neg Hx    Stomach cancer Neg Hx    Kidney disease Neg Hx    Liver disease Neg Hx    Allergies  Allergen Reactions   Hydrocodone Itching   Acetaminophen  Hives and Itching   Guaifenesin Hives and Itching   Hydrocodone-Acetaminophen  Itching   Iodinated Contrast Media Dermatitis   Pseudoephedrine Hives,  Itching and Other (See Comments)    Burning   Shellfish Allergy  Hives and Itching   Antihistamines, Chlorpheniramine-Type Anxiety    ROS Negative unless stated above    Objective:     BP 120/88 (BP Location: Left Arm, Patient Position: Sitting, Cuff Size: Large)   Pulse 79   Temp 98.4 F (36.9 C) (Oral)   Ht 5' 5 (1.651 m)   Wt 229 lb 3.2 oz (104 kg)   LMP 04/10/2020 (Approximate)   SpO2 98%   BMI 38.14 kg/m  BP Readings from Last 3 Encounters:  09/21/23 120/88  09/19/23 110/80  06/16/23 (!) 142/84   Wt Readings from Last 3 Encounters:  09/21/23 229 lb 3.2 oz (104 kg)  09/19/23 228 lb 14.4 oz (103.8 kg)  06/16/23 225 lb 3.2 oz (102.2 kg)      Physical Exam Constitutional:      General: She is not in acute distress.    Appearance: Normal appearance.  HENT:     Head: Normocephalic and atraumatic.     Nose: Nose normal.     Mouth/Throat:     Mouth: Mucous membranes are moist.  Cardiovascular:     Rate and Rhythm: Normal rate and regular rhythm.     Heart sounds: Normal heart sounds. No murmur heard.    No gallop.  Pulmonary:     Effort: Pulmonary effort is normal. No respiratory distress.     Breath sounds: Normal breath sounds. No wheezing, rhonchi or rales.  Skin:    General: Skin is warm and dry.  Neurological:     Mental Status: She is alert and oriented to person, place, and time.        09/21/2023   10:27 AM 03/15/2023    4:46 PM 08/18/2021    4:35 PM  Depression screen PHQ 2/9  Decreased Interest 2 2 2   Down, Depressed, Hopeless 1 2 1   PHQ - 2 Score 3 4 3   Altered sleeping 3 1 0  Tired, decreased energy 3 3 2   Change in appetite 1 0 1  Feeling bad or failure about yourself  0 0 0  Trouble concentrating 1 3 1   Moving slowly or fidgety/restless 1 0 0  Suicidal thoughts 0 0 0  PHQ-9 Score 12 11 7   Difficult doing work/chores Somewhat difficult  Somewhat difficult      09/21/2023   10:27 AM 03/15/2023    4:46 PM  GAD 7 : Generalized Anxiety  Score  Nervous, Anxious, on Edge 1 1  Control/stop worrying 1 0  Worry too much - different things  0  Trouble relaxing 2 0  Restless 1 0  Easily annoyed or irritable 3 3  Afraid - awful might happen 2 2  Total GAD 7 Score  6  Anxiety Difficulty Somewhat difficult Somewhat difficult     No results found for any visits on 09/21/23.    Assessment & Plan:   H. pylori infection -     Bismuth /Metronidaz/Tetracyclin; Take 1 capsule by mouth in the morning, at noon, in the evening, and at bedtime for 10 days.  Dispense: 40 capsule; Refill: 0 -     Pantoprazole  Sodium; Take 1 tablet (40 mg total) by mouth 2 (two) times daily for 10 days.  Dispense: 20 tablet; Refill: 0  Diverticulosis  Umbilical hernia without obstruction and without gangrene  Nasopharyngeal mass  S/P cervical spinal fusion   Stool studies form 9/9 positive for H.pylori.  Start txt.  Pylera QID and PPI BID x 10 d.  Keep f/u with GI.  Given precautions.  Test of cure upon treatment completion.  Incidental findings of diverticulosis without diverticulitis, small fat containing umbilical hernia, and subcentimeter hypodense renal lesions in b/l kidneys, likely benign noted on CT abd and pelvis with contrast at the [facility] on 09/18/23.  Continue to monitor.  Continue f/u with ENT for nasopharyngeal mass.  Bxp scheduled in 2 wks.  Continue f/u with Neurosurg for h/o cervical spinal fusion C5-6 and C6-7.   Return if symptoms worsen or fail to improve.   Clotilda JONELLE Single, MD

## 2023-09-22 ENCOUNTER — Encounter: Payer: Self-pay | Admitting: Gastroenterology

## 2023-09-22 ENCOUNTER — Telehealth: Admitting: Family Medicine

## 2023-09-22 DIAGNOSIS — Z91041 Radiographic dye allergy status: Secondary | ICD-10-CM | POA: Diagnosis not present

## 2023-09-22 DIAGNOSIS — L509 Urticaria, unspecified: Secondary | ICD-10-CM | POA: Diagnosis not present

## 2023-09-22 MED ORDER — PREDNISONE 10 MG PO TABS: ORAL_TABLET | ORAL | 0 refills | Status: AC

## 2023-09-22 MED ORDER — TRIAMCINOLONE ACETONIDE 0.1 % EX OINT
1.0000 | TOPICAL_OINTMENT | Freq: Every day | CUTANEOUS | 1 refills | Status: AC
Start: 2023-09-22 — End: ?

## 2023-09-22 MED ORDER — FEXOFENADINE HCL 180 MG PO TABS: 180.0000 mg | ORAL_TABLET | Freq: Every day | ORAL | 0 refills | Status: DC

## 2023-09-22 NOTE — Progress Notes (Signed)
 Virtual Visit via Video Note  I connected with [name] on 09/22/23 at  8:30 AM EDT by a video enabled telemedicine application and verified that I am speaking with the correct person using two identifiers.  Location patient: home Location provider:work or home office Persons participating in the virtual visit: patient, provider  I discussed the limitations of evaluation and management by telemedicine and the availability of in person appointments. The patient expressed understanding and agreed to proceed. Chief Complaint  Patient presents with   Acute Visit    Patient has a rash that started Tues 9/9, after the patient had a CT done on Monday with contrast, patient has patches all over, itchy, red, bumps,  it is worse at night,      HPI: Patient is a 48 year old female seen for concern.  Patient endorses rash on chest, neck, back x 4 days.  Symptoms started after having a CT with contrast on 09/18/2023 at the [facility].  Patient notes prior history of contrast media allergy  causing dermatitis.  Patient states the rash this time appears worse.  Patient has tried Benadryl  and other over-the-counter itch creams without relief.  Unable to sleep.  Denies SOB, throat irritation, mouth or facial edema.   ROS: See pertinent positives and negatives per HPI.  Past Medical History:  Diagnosis Date   Allergy     Anal fissure    Arthritis    Asthma    Blood in stool    Cholelithiasis    Deciduitis 2024   Depression    GERD (gastroesophageal reflux disease)    Hypertension    Liver hemangioma    Pneumonia    as a child   Pre-diabetes    SVD (spontaneous vaginal delivery) 07/15/2016   Ulcer     Past Surgical History:  Procedure Laterality Date   ANTERIOR CERVICAL DECOMP/DISCECTOMY FUSION N/A 10/18/2022   Procedure: Anterior Cervical Decompression Fusion Cervical five-six, Cervical six-seven, Removal CERVICAL PLATE;  Surgeon: Debby Dorn MATSU, MD;  Location: [facility] OR;  Service: Neurosurgery;   Laterality: N/A;   CERVICAL DISCECTOMY  03-2008   CHOLECYSTECTOMY N/A 04/21/2017   Procedure: LAPAROSCOPIC CHOLECYSTECTOMY;  Surgeon: Signe Mitzie LABOR, MD;  Location: [facility] OR;  Service: General;  Laterality: N/A;   COLONOSCOPY  2010   WISDOM TOOTH EXTRACTION      Family History  Problem Relation Age of Onset   Breast cancer Maternal Grandmother    Ovarian cancer Mother    Diabetes Paternal Grandmother    Hypertension Father    Seizures Father    Colon cancer Neg Hx    Esophageal cancer Neg Hx    Stomach cancer Neg Hx    Kidney disease Neg Hx    Liver disease Neg Hx      Current Outpatient Medications:    albuterol  (PROVENTIL  HFA;VENTOLIN  HFA) 108 (90 Base) MCG/ACT inhaler, Inhale 1-2 puffs into the lungs every 6 (six) hours as needed for wheezing or shortness of breath., Disp: , Rfl:    albuterol  (VENTOLIN  HFA) 108 (90 Base) MCG/ACT inhaler, Inhale 2 puffs into the lungs every 6 (six) hours as needed for wheezing or shortness of breath., Disp: 8 g, Rfl: 2   B Complex-C (SUPER B COMPLEX PO), Take 1 capsule by mouth daily., Disp: , Rfl:    baclofen (LIORESAL) 10 MG tablet, Take 10 mg by mouth daily as needed for muscle spasms., Disp: , Rfl:    benzonatate  (TESSALON ) 100 MG capsule, Take 1 capsule (100 mg total) by mouth 2 (  two) times daily as needed for cough., Disp: 20 capsule, Rfl: 0   Bismuth /Metronidaz/Tetracyclin (PYLERA) 140-125-125 MG CAPS, Take 1 capsule by mouth in the morning, at noon, in the evening, and at bedtime for 10 days., Disp: 40 capsule, Rfl: 0   budesonide -formoterol  (SYMBICORT ) 80-4.5 MCG/ACT inhaler, Take 2 puffs first thing in am and then another 2 puffs about 12 hours later., Disp: 1 Inhaler, Rfl: 11   diclofenac  sodium (VOLTAREN ) 1 % GEL, Apply 2-4 g topically 4 (four) times daily as needed (for pain.). , Disp: , Rfl:    docusate sodium  (COLACE) 100 MG capsule, Take 1 capsule (100 mg total) by mouth 2 (two) times daily., Disp: 30 capsule, Rfl: 0   fexofenadine   (ALLEGRA  ALLERGY ) 180 MG tablet, Take 1 tablet (180 mg total) by mouth daily., Disp: 30 tablet, Rfl: 0   fluticasone  (FLONASE ) 50 MCG/ACT nasal spray, Place 1 spray into both nostrils daily., Disp: 16 g, Rfl: 0   gabapentin  (NEURONTIN ) 600 MG tablet, Take 600 mg by mouth 2 (two) times daily as needed (for pain.). , Disp: , Rfl:    montelukast  (SINGULAIR ) 10 MG tablet, Take 1 tablet (10 mg total) by mouth daily., Disp: 30 tablet, Rfl: 2   Multiple Vitamin (MULTIVITAMIN) tablet, Take 1 tablet by mouth daily., Disp: , Rfl:    oxyCODONE  (OXY IR/ROXICODONE ) 5 MG immediate release tablet, Take 1 tablet (5 mg total) by mouth every 4 (four) hours as needed for moderate pain ((score 4 to 6))., Disp: 30 tablet, Rfl: 0   pantoprazole  (PROTONIX ) 40 MG tablet, Take 1 tablet (40 mg total) by mouth 2 (two) times daily for 10 days., Disp: 20 tablet, Rfl: 0   predniSONE  (DELTASONE ) 10 MG tablet, Take 4 tabs every morning for 3 days, 3 tabs for 2 days, 2 tabs for 2 days, 1 tab for 1 day., Disp: 23 tablet, Rfl: 0   triamcinolone  ointment (KENALOG ) 0.1 %, Apply 1 Application topically daily., Disp: 80 g, Rfl: 1  EXAM:  VITALS per patient if applicable: RR between 12-20 bpm  GENERAL: alert, oriented, appears well and in no acute distress  HEENT: atraumatic, conjunctiva clear, no obvious abnormalities on inspection of external nose and ears  NECK: normal movements of the head and neck  LUNGS: on inspection no signs of respiratory distress, breathing rate appears normal, no obvious gross SOB, gasping or wheezing  Skin: Maculopapular exanthem on upper chest, neck, back, abdomen.  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  Allergy  previously on allergy  list in this system.  Advised [facility]. advised will likely need pretreatment with future imaging using contrast.  Prednisone  taper, OTC antihistamine, triamcinolone  as needed.  Given strict precautions.  I discussed the assessment and treatment plan with the patient. The patient was provided an opportunity to ask questions and all were answered. The patient agreed with the plan and demonstrated an understanding of the instructions.   The patient was advised to call back or seek an in-person evaluation if the symptoms worsen or if the condition fails to improve  as anticipated.   Clotilda JONELLE Single, MD

## 2023-09-22 NOTE — Progress Notes (Signed)
 Patient was unable to self-report due to a lack of equipment at home via telehealth

## 2023-10-11 ENCOUNTER — Encounter: Payer: Self-pay | Admitting: Family Medicine

## 2023-10-13 ENCOUNTER — Other Ambulatory Visit: Payer: Self-pay | Admitting: Surgery

## 2023-10-13 DIAGNOSIS — Z1231 Encounter for screening mammogram for malignant neoplasm of breast: Secondary | ICD-10-CM | POA: Diagnosis not present

## 2023-10-14 ENCOUNTER — Other Ambulatory Visit: Payer: Self-pay | Admitting: Family Medicine

## 2023-10-14 DIAGNOSIS — Z91041 Radiographic dye allergy status: Secondary | ICD-10-CM

## 2023-10-24 ENCOUNTER — Other Ambulatory Visit: Payer: Self-pay | Admitting: Medical Genetics

## 2023-10-25 ENCOUNTER — Other Ambulatory Visit: Payer: Self-pay | Admitting: Surgery

## 2023-10-25 DIAGNOSIS — M5412 Radiculopathy, cervical region: Secondary | ICD-10-CM

## 2023-10-27 ENCOUNTER — Encounter: Payer: Self-pay | Admitting: Family Medicine

## 2023-10-27 ENCOUNTER — Telehealth: Payer: Self-pay | Admitting: *Deleted

## 2023-10-27 NOTE — Telephone Encounter (Unsigned)
 Copied from CRM [id number]. Topic: Clinical - Medical Advice >> Oct 27, 2023  8:22 AM Anairis L wrote: Reason for CRM: Patient wanted to reschedule physical, unable kept prompting to schedule AWV, she does not have medicare.   Please advise.

## 2023-10-30 NOTE — Telephone Encounter (Signed)
 Called and spoke with patient patient has a neww appt for 10/27

## 2023-11-06 ENCOUNTER — Encounter: Admitting: Family Medicine

## 2023-11-07 ENCOUNTER — Other Ambulatory Visit: Payer: Self-pay

## 2023-11-10 ENCOUNTER — Ambulatory Visit: Payer: Self-pay | Admitting: Gastroenterology

## 2023-11-10 ENCOUNTER — Encounter: Payer: Self-pay | Admitting: Gastroenterology

## 2023-11-10 ENCOUNTER — Other Ambulatory Visit (INDEPENDENT_AMBULATORY_CARE_PROVIDER_SITE_OTHER)

## 2023-11-10 VITALS — BP 116/72 | HR 83 | Ht 65.0 in | Wt 231.4 lb

## 2023-11-10 DIAGNOSIS — A048 Other specified bacterial intestinal infections: Secondary | ICD-10-CM | POA: Diagnosis not present

## 2023-11-10 DIAGNOSIS — K760 Fatty (change of) liver, not elsewhere classified: Secondary | ICD-10-CM | POA: Diagnosis not present

## 2023-11-10 DIAGNOSIS — R748 Abnormal levels of other serum enzymes: Secondary | ICD-10-CM

## 2023-11-10 DIAGNOSIS — R198 Other specified symptoms and signs involving the digestive system and abdomen: Secondary | ICD-10-CM

## 2023-11-10 DIAGNOSIS — K648 Other hemorrhoids: Secondary | ICD-10-CM | POA: Diagnosis not present

## 2023-11-10 DIAGNOSIS — K625 Hemorrhage of anus and rectum: Secondary | ICD-10-CM

## 2023-11-10 DIAGNOSIS — R140 Abdominal distension (gaseous): Secondary | ICD-10-CM

## 2023-11-10 DIAGNOSIS — R1010 Upper abdominal pain, unspecified: Secondary | ICD-10-CM

## 2023-11-10 LAB — COMPREHENSIVE METABOLIC PANEL WITH GFR
ALT: 73 U/L — ABNORMAL HIGH (ref 0–35)
AST: 47 U/L — ABNORMAL HIGH (ref 0–37)
Albumin: 4.4 g/dL (ref 3.5–5.2)
Alkaline Phosphatase: 98 U/L (ref 39–117)
BUN: 11 mg/dL (ref 6–23)
CO2: 28 meq/L (ref 19–32)
Calcium: 9.4 mg/dL (ref 8.4–10.5)
Chloride: 104 meq/L (ref 96–112)
Creatinine, Ser: 0.83 mg/dL (ref 0.40–1.20)
GFR: 84.03 mL/min (ref >60.00)
Glucose, Bld: 94 mg/dL (ref 70–99)
Potassium: 4 meq/L (ref 3.5–5.1)
Sodium: 140 meq/L (ref 135–145)
Total Bilirubin: 0.7 mg/dL (ref 0.2–1.2)
Total Protein: 8 g/dL (ref 6.0–8.3)

## 2023-11-10 LAB — PROTIME-INR
INR: 1.1 ratio — ABNORMAL HIGH (ref 0.8–1.0)
Prothrombin Time: 11.2 s (ref 9.6–13.1)

## 2023-11-10 LAB — IBC + FERRITIN
Ferritin: 18 ng/mL (ref 10.0–291.0)
Iron: 41 ug/dL — ABNORMAL LOW (ref 42–145)
Saturation Ratios: 10.2 % — ABNORMAL LOW (ref 20.0–50.0)
TIBC: 403.2 ug/dL (ref 250.0–450.0)
Transferrin: 288 mg/dL (ref 212.0–360.0)

## 2023-11-10 LAB — CBC WITH DIFFERENTIAL/PLATELET
Basophils Absolute: 0 K/uL (ref 0.0–0.1)
Basophils Relative: 0.6 % (ref 0.0–3.0)
Eosinophils Absolute: 0.1 K/uL (ref 0.0–0.7)
Eosinophils Relative: 1.2 % (ref 0.0–5.0)
HCT: 37.7 % (ref 36.0–46.0)
Hemoglobin: 12.2 g/dL (ref 12.0–15.0)
Lymphocytes Relative: 32.4 % (ref 12.0–46.0)
Lymphs Abs: 1.6 K/uL (ref 0.7–4.0)
MCHC: 32.3 g/dL (ref 30.0–36.0)
MCV: 89.8 fl (ref 78.0–100.0)
Monocytes Absolute: 0.4 K/uL (ref 0.1–1.0)
Monocytes Relative: 7.7 % (ref 3.0–12.0)
Neutro Abs: 2.9 K/uL (ref 1.4–7.7)
Neutrophils Relative %: 58.1 % (ref 43.0–77.0)
Platelets: 283 K/uL (ref 150.0–400.0)
RBC: 4.2 Mil/uL (ref 3.87–5.11)
RDW: 14.4 % (ref 11.5–15.5)
WBC: 4.9 K/uL (ref 4.0–10.5)

## 2023-11-10 LAB — TSH: TSH: 1.24 u[IU]/mL (ref 0.35–5.50)

## 2023-11-10 MED ORDER — HYDROCORTISONE ACETATE 25 MG RE SUPP: RECTAL | 1 refills | Status: AC

## 2023-11-10 MED ORDER — HYDROCORTISONE ACETATE 25 MG RE SUPP: RECTAL | 1 refills | Status: DC

## 2023-11-10 NOTE — Progress Notes (Signed)
 [name] [id number] 1975-02-10   Chief Complaint: Abdominal pain and bloating, alternating constipation and diarrhea, rectal bleeding  Referring Provider: Mercer Clotilda SAUNDERS, MD Primary GI MD: Sampson (previous Dr. Teressa)  HPI: [name] is a 48 y.o. female with past medical history of anal fissure, cholecystectomy, depression, GERD, HTN, prediabetes who presents today for a complaint of bloating and abdominal pain.    Seen by PCP 09/21/2023 for follow-up of abdominal pain and bloating and blood in stool.  Symptoms felt similar to previous episode of H. pylori.  Stool studies were positive.  Had been seen at urgent care and CT A/P with contrast showed diverticulosis, subcentimeter hypodense bilateral renal lesions too small to characterize, likely benign, and a small fat-containing umbilical hernia. She was treated bismuth /metronidazole/tetracycline and pantoprazole , started treatment 09/21/2023.  Recent surgery for nasopharyngeal mass.  Labs 09/19/2023: Normal CBC, vitamin B12, folate.  Iron panel shows ferritin of 22, iron saturation borderline at 16%.  Has had mild elevation in ALT over the past year.   Discussed the use of AI scribe software for clinical note transcription with the patient, who gave verbal consent to proceed.  History of Present Illness [name] is a 48 year old female with a history of H. pylori infection and diverticulosis who presents with abdominal pain, bloating, and rectal bleeding.  She experiences abdominal pain primarily on the right side, accompanied by bloating and changes in bowel habits, including alternating diarrhea and constipation. She recently completed treatment for H. pylori infection, although she started the medication two days late due to pharmacy issues. She has not been taking pantoprazole  recently as she ran out of the medication, which was prescribed for 30 days.  She describes intermittent abdominal pain and bloating,  sometimes associated with dietary intake. She occasionally notices blood in her stool, which prompted her to seek urgent care. A CT scan performed at urgent care showed diverticulosis. She has a history of H. pylori infection dating back to 2010, with similar symptoms of bloating and gas. The recent episode of rectal bleeding was more significant than previous occurrences, with a notable amount of blood in the toilet, which was not typical for her hemorrhoids.  She underwent a colonoscopy in 2019, which, according to her recollection and prior records, showed internal hemorrhoids but no polyps or other abnormalities. She has experienced intermittent rectal bleeding in the past, usually seen on toilet paper, but the recent episode was more severe. She has been consuming beet juice and cranberry juice, which she suspects might be affecting the color of her stool.  She has a history of fatty liver, identified on an ultrasound in March, and a slight elevation in liver enzymes. Her gallbladder has been removed. She experiences occasional acid reflux, particularly after consuming certain foods like pizza or fried items, but it occurs infrequently, about once a week. She does not currently take Pepcid .  She uses Metamucil for bowel regulation but experiences alternating diarrhea and constipation. Her bowel movements start hard and then become softer. She has used Miralax  and Metamucil in the past.    Previous GI Procedures/Imaging   RUQ US  03/16/2023 Fatty liver infiltration. Previous echogenic liver lesion not seen on today's ultrasound. If there is further concern additional cross-sectional study with with dynamic imaging such as MRI could be considered as clinically appropriate.   Previous cholecystectomy.  No ductal dilatation.   Colonoscopy 05/09/2017 - Internal hemorrhoids.  - The examination was otherwise normal on direct and retroflexion views.  -  No specimens collected. - Recall 10 years  EGD  09/10/2008 Mild gastritis in the body in the antrum of the stomach Normal duodenum Normal esophagus Occult stricture dilated  Past Medical History:  Diagnosis Date   Allergy     Anal fissure    Arthritis    Asthma    Blood in stool    Cholelithiasis    Deciduitis 2024   Depression    Diverticulosis    GERD (gastroesophageal reflux disease)    Hypertension    IBS (irritable bowel syndrome)    Liver hemangioma    Pneumonia    as a child   Pre-diabetes    SVD (spontaneous vaginal delivery) 07/15/2016   Ulcer     Past Surgical History:  Procedure Laterality Date   ANTERIOR CERVICAL DECOMP/DISCECTOMY FUSION N/A 10/18/2022   Procedure: Anterior Cervical Decompression Fusion Cervical five-six, Cervical six-seven, Removal CERVICAL PLATE;  Surgeon: Debby Dorn MATSU, MD;  Location[facility];  Service: Neurosurgery;  Laterality: N/A;   CERVICAL DISCECTOMY  03-2008   CHOLECYSTECTOMY N/A 04/21/2017   Procedure: LAPAROSCOPIC CHOLECYSTECTOMY;  Surgeon: Signe Mitzie LABOR, MD;  Location: [facility] OR;  Service: General;  Laterality: N/A;   COLONOSCOPY  2010   WISDOM TOOTH EXTRACTION      Current Outpatient Medications  Medication Sig Dispense Refill   albuterol  (PROVENTIL  HFA;VENTOLIN  HFA) 108 (90 Base) MCG/ACT inhaler Inhale 1-2 puffs into the lungs every 6 (six) hours as needed for wheezing or shortness of breath.     albuterol  (VENTOLIN  HFA) 108 (90 Base) MCG/ACT inhaler Inhale 2 puffs into the lungs every 6 (six) hours as needed for wheezing or shortness of breath. 8 g 2   baclofen (LIORESAL) 10 MG tablet Take 10 mg by mouth daily as needed for muscle spasms.     Bismuth /Metronidaz/Tetracyclin (PYLERA) 140-125-125 MG CAPS Take 1 capsule by mouth in the morning, at noon, in the evening, and at bedtime for 10 days. 40 capsule 0   budesonide -formoterol  (SYMBICORT ) 80-4.5 MCG/ACT inhaler Take 2 puffs first thing in am and then another 2 puffs about 12 hours later. 1 Inhaler 11   Cetirizine HCl (ZYRTEC  ALLERGY ) 10 MG CAPS      diclofenac  sodium (VOLTAREN ) 1 % GEL Apply 2-4 g topically 4 (four) times daily as needed (for pain.).      famotidine  (PEPCID ) 20 MG tablet Take 20 mg by mouth.     gabapentin  (NEURONTIN ) 600 MG tablet Take 600 mg by mouth 2 (two) times daily as needed (for pain.).      methocarbamol  (ROBAXIN ) 500 MG tablet Take 500 mg by mouth.     montelukast  (SINGULAIR ) 10 MG tablet Take 1 tablet (10 mg total) by mouth daily. 30 tablet 2   pantoprazole  (PROTONIX ) 40 MG tablet Take 1 tablet (40 mg total) by mouth 2 (two) times daily for 10 days. 20 tablet 0   predniSONE  (DELTASONE ) 10 MG tablet Take 4 tabs every morning for 3 days, 3 tabs for 2 days, 2 tabs for 2 days, 1 tab for 1 day. 23 tablet 0   triamcinolone  ointment (KENALOG ) 0.1 % Apply 1 Application topically daily. 80 g 1   B Complex-C (SUPER B COMPLEX PO) Take 1 capsule by mouth daily. (Patient not taking: Reported on 11/10/2023)     benzonatate  (TESSALON ) 100 MG capsule Take 1 capsule (100 mg total) by mouth 2 (two) times daily as needed for cough. (Patient not taking: Reported on 11/10/2023) 20 capsule 0   Clocortolone Pivalate (CLODERM)  0.1 % cream  (Patient not taking: Reported on 11/10/2023)     docusate sodium  (COLACE) 100 MG capsule Take 1 capsule (100 mg total) by mouth 2 (two) times daily. (Patient not taking: Reported on 11/10/2023) 30 capsule 0   fexofenadine  (ALLEGRA ) 180 MG tablet TAKE 1 TABLET BY MOUTH EVERY DAY (Patient not taking: Reported on 11/10/2023) 90 tablet 1   fluticasone  (FLONASE ) 50 MCG/ACT nasal spray Place 1 spray into both nostrils daily. (Patient not taking: Reported on 11/10/2023) 16 g 0   Multiple Vitamin (MULTIVITAMIN) tablet Take 1 tablet by mouth daily. (Patient not taking: Reported on 11/10/2023)     oxyCODONE  (OXY IR/ROXICODONE ) 5 MG immediate release tablet Take 1 tablet (5 mg total) by mouth every 4 (four) hours as needed for moderate pain ((score 4 to 6)). (Patient not taking: Reported on  11/10/2023) 30 tablet 0   tobramycin (TOBREX) 0.3 % ophthalmic solution 1 drop into affected eye Ophthalmic every 4 hrs (Patient not taking: Reported on 11/10/2023)     No current facility-administered medications for this visit.    Allergies as of 11/10/2023 - Review Complete 11/10/2023  Allergen Reaction Noted   Hydrocodone Itching 07/08/2010   Acetaminophen  Hives and Itching    Guaifenesin Hives and Itching 09/02/2008   Guaifenesin er Hives 11/07/2023   Hydrocodone-acetaminophen  Itching 08/19/2008   Iodinated contrast media Dermatitis 05/01/2019   Pseudoephedrine Hives, Itching, and Other (See Comments) 03/29/2004   Robitussin dm max day-night Hives 11/07/2023   Shellfish allergy  Hives and Itching 02/05/2013   Antihistamines, chlorpheniramine-type Anxiety 03/14/2008    Family History  Problem Relation Age of Onset   Breast cancer Maternal Grandmother    Ovarian cancer Mother    Diabetes Paternal Grandmother    Hypertension Father    Seizures Father    Colon cancer Neg Hx    Esophageal cancer Neg Hx    Stomach cancer Neg Hx    Kidney disease Neg Hx    Liver disease Neg Hx     Social History   Tobacco Use   Smoking status: Never    Passive exposure: Yes   Smokeless tobacco: Never   Tobacco comments:    grandmother smoked in home as a child  Vaping Use   Vaping status: Never Used  Substance Use Topics   Alcohol use: Yes    Alcohol/week: 3.0 standard drinks of alcohol    Types: 3 Shots of liquor per week    Comment: 3 drinks a weeks   Drug use: No     Review of Systems:    Constitutional: No weight loss, fever, chills Cardiovascular: No chest pain Respiratory: No SOB  Gastrointestinal: See HPI and otherwise negative   Physical Exam:  Vital signs: BP 116/72   Pulse 83   Ht 5' 5 (1.651 m) Comment: measured without shoes  Wt 231 lb 6 oz (105 kg)   LMP 04/10/2020 (Approximate)   BMI 38.50 kg/m   Wt Readings from Last 3 Encounters:  11/10/23 231 lb 6 oz  (105 kg)  09/21/23 229 lb 3.2 oz (104 kg)  09/19/23 228 lb 14.4 oz (103.8 kg)     Constitutional: Pleasant, obese female in NAD, alert and cooperative Head:  Normocephalic and atraumatic.  Eyes: No scleral icterus.  Respiratory: Respirations even and unlabored. Lungs clear to auscultation bilaterally.  No wheezes, crackles, or rhonchi.  Cardiovascular:  Regular rate and rhythm. No murmurs. No peripheral edema. Gastrointestinal:  Soft, nondistended, generalized discomfort and fullness on palpation. No rebound  or guarding. Normal bowel sounds. No appreciable masses or hepatomegaly. Rectal: External hemorrhoids, no fissures. On anoscopy has internal hemorrhoids with stigmata of bleeding.  Chaperone present for exam Neurologic:  Alert and oriented x4;  grossly normal neurologically.  Skin:   Dry and intact without significant lesions or rashes. Psychiatric: Oriented to person, place and time. Demonstrates good judgement and reason without abnormal affect or behaviors.   RELEVANT LABS AND IMAGING: CBC    Component Value Date/Time   WBC 4.1 09/19/2023 0846   RBC 4.14 09/19/2023 0846   HGB 12.0 09/19/2023 0846   HCT 36.8 09/19/2023 0846   PLT 316.0 09/19/2023 0846   MCV 88.8 09/19/2023 0846   MCH 31.3 06/14/2023 2354   MCHC 32.5 09/19/2023 0846   RDW 13.8 09/19/2023 0846   LYMPHSABS 1.4 09/19/2023 0846   MONOABS 0.3 09/19/2023 0846   EOSABS 0.1 09/19/2023 0846   BASOSABS 0.0 09/19/2023 0846    CMP     Component Value Date/Time   NA 140 06/14/2023 2354   K 3.8 06/14/2023 2354   CL 106 06/14/2023 2354   CO2 26 06/14/2023 2354   GLUCOSE 104 (H) 06/14/2023 2354   BUN 10 06/14/2023 2354   CREATININE 0.87 06/14/2023 2354   CALCIUM 9.7 06/14/2023 2354   PROT 7.5 06/14/2023 2354   ALBUMIN 3.9 06/14/2023 2354   AST 35 06/14/2023 2354   ALT 57 (H) 06/14/2023 2354   ALKPHOS 70 06/14/2023 2354   BILITOT 0.6 06/14/2023 2354   GFRNONAA >60 06/14/2023 2354   GFRAA >60 04/13/2017 1131      Assessment/Plan:   Abdominal pain Abdominal bloating Alternating constipation and diarrhea Blood in stools Internal hemorrhoids Patient reports intermittent abdominal pain and bloating which seems to be associated with dietary intake.  Intermittently also notices blood in her stool which she has attributed to hemorrhoids.  Recently had an episode of rectal bleeding which was more significant than usual.  Was seen in urgent care and had a CT scan which showed no acute findings.  No evidence of anemia on recent labs though did have ferritin of 22 and iron saturation borderline at 16%.  Normal vitamin B12 and folate. Last colonoscopy 2019 with finding of internal hemorrhoids and otherwise normal with recommended recall in 10 years. On exam today he has internal hemorrhoids with stigmata of bleeding as well as external hemorrhoids.  Will treat with Anusol  suppositories and if bleeding persists consider repeat colonoscopy, potential hemorrhoid banding in future.  - Recommend low FODMAP diet - IBgard samples - Advised use of OTC Gas-X/Phazyme as needed for gas and bloating - Consider empiric treatment for SIBO if no improvement with the above - Advise use of fiber supplement to help regulate bowel movements - Anusol  suppositories prescribed, use 1 at night per rectum for 7 to 10 days  H. pylori positive Patient recently found to be positive for H. pylori on test done with PCP.  Was treated with Pylera in September.  Has not had eradication study so we will order this today.    - Order H. pylori Diatherix - If persistent will re-treat   Hepatic steatosis Elevated liver enzymes Patient with mild elevation in ALT over the previous year, and on RUQ ultrasound in March was found to have fatty infiltration of the liver.  Has multiple risk factors for metabolic associated liver disease, but will pursue further serologic evaluation to rule out other causes.  Her fibrosis 4 score is low risk at  0.69.  -  Labs today: CBC, CMP, TSH, PT/INR, TTG, IgA, iron/ferritin, HepCAb, HepBsAg, HepBsAb, HepBcAb, HAV, IgG, ANA, ASMA, AMA, Alpha-one-antitrypsin level, ceruloplasmin - Consider elastography if FIB-4 suggests advanced fibrosis.  - Abstain from all alcohol including beer, wine, liquor, and non-alcoholic beer.  - Work to maintain a healthy weight through portion control and exercise  - Maximize control of any hyperglycemia and hyperlipidemia    Fibrosis 4 Score = .69 (Low risk)        Interpretation for patients with NAFLD          <1.30       -  F0-F1 (Low risk)          1.30-2.67 -  Indeterminate           >2.67      -  F3-F4 (High risk)     Validated for ages 88-65        Ana Furbish, PA-C [facility] 11/10/2023, 9:00 AM  Patient Care Team: Mercer Clotilda SAUNDERS, MD as PCP - General (Family Medicine) Danielle Rom, MD as Consulting Physician (Obstetrics and Gynecology)

## 2023-11-10 NOTE — Patient Instructions (Addendum)
 Your provider has requested that you go to the basement level for lab work before leaving today. Press B on the elevator. The lab is located at the first door on the left as you exit the elevator.   Continue fiber supplement.  Follow Low-FODMAP diet.  Try over the counter Ibgard.   Try over the counter GasX or Phazyme.   We have sent the following medications to your pharmacy for you to pick up at your convenience: Anusol  suppositories   Your provider has ordered Diatherix stool testing for you. You have received a kit from our office today containing all necessary supplies to complete this test. Please carefully read the stool collection instructions provided in the kit before opening the accompanying materials. In addition, be sure there is a label providing your full name and date of birth on the puritan opti-swab tube that is supplied in the kit (if you do not see a label with this information on your test tube, please make us  aware before test collection!). After completing the test, you should secure the purtian tube into the specimen biohazard bag. The [facility] E-Req sheet (including date and time of specimen collection) should be placed into the outside pocket of the specimen biohazard bag and returned to the [facility] lab (basement floor of [facility]) within 3 days of collection. Please make sure to give the specimen to a staff member at the lab. DO NOT leave the specimen on the counter.   If the specimen date and time (can be found in the upper right boxed portion of the sheet) are not filled out on the E-Req sheet, the test will NOT be performed.

## 2023-11-12 ENCOUNTER — Encounter: Payer: Self-pay | Admitting: Gastroenterology

## 2023-11-14 LAB — ANTI-NUCLEAR AB-TITER (ANA TITER)
ANA TITER: 1:80 {titer} — ABNORMAL HIGH
ANA Titer 1: 1:40 {titer} — ABNORMAL HIGH

## 2023-11-14 LAB — HEPATITIS A ANTIBODY, TOTAL: Hepatitis A AB,Total: REACTIVE — AB

## 2023-11-14 LAB — CERULOPLASMIN: Ceruloplasmin: 26 mg/dL (ref 14–48)

## 2023-11-14 LAB — ANTI-SMOOTH MUSCLE ANTIBODY, IGG: Actin (Smooth Muscle) Antibody (IGG): 27 U — ABNORMAL HIGH (ref <20)

## 2023-11-14 LAB — TISSUE TRANSGLUTAMINASE, IGA: (tTG) Ab, IgA: <1 U/mL

## 2023-11-14 LAB — HEPATITIS B SURFACE ANTIBODY, QUANTITATIVE: Hep B S AB Quant (Post): >1000 m[IU]/mL (ref >=10)

## 2023-11-14 LAB — ANA: Anti Nuclear Antibody (ANA): POSITIVE — AB

## 2023-11-14 LAB — IGA: Immunoglobulin A: 216 mg/dL (ref 47–310)

## 2023-11-14 LAB — ALPHA-1-ANTITRYPSIN: A-1 Antitrypsin, Ser: 127 mg/dL (ref 83–199)

## 2023-11-14 LAB — MITOCHONDRIAL ANTIBODIES: Mitochondrial M2 Ab, IgG: <=20 U (ref <=20.0)

## 2023-11-14 LAB — HEPATITIS B SURFACE ANTIGEN: Hepatitis B Surface Ag: NONREACTIVE

## 2023-11-14 LAB — HEPATITIS C ANTIBODY: Hepatitis C Ab: NONREACTIVE

## 2023-11-15 ENCOUNTER — Ambulatory Visit: Payer: Self-pay | Admitting: Gastroenterology

## 2023-11-15 DIAGNOSIS — A048 Other specified bacterial intestinal infections: Secondary | ICD-10-CM

## 2023-11-15 DIAGNOSIS — K625 Hemorrhage of anus and rectum: Secondary | ICD-10-CM

## 2023-11-15 DIAGNOSIS — R748 Abnormal levels of other serum enzymes: Secondary | ICD-10-CM

## 2023-11-15 NOTE — Progress Notes (Signed)
 Attending Physician's Attestation   I have reviewed the chart.   I agree with the Advanced Practitioner's note, impression, and recommendations with any updates as below. If rectal bleeding persists then colonoscopic evaluation is very reasonable. Would agree with H. Pylori followup and hopefully will be negative.  But if still having symptoms then will require EGD consideration as well. Consider an updated RUQUS as well to ensure no cholelithiasis if symptoms persisting.   Aloha Finner, MD [facility] Advanced Endoscopy Office # [phone]

## 2023-11-24 ENCOUNTER — Encounter: Admitting: Family Medicine

## 2023-11-24 ENCOUNTER — Other Ambulatory Visit (INDEPENDENT_AMBULATORY_CARE_PROVIDER_SITE_OTHER)

## 2023-11-24 DIAGNOSIS — R748 Abnormal levels of other serum enzymes: Secondary | ICD-10-CM | POA: Diagnosis not present

## 2023-11-25 LAB — IGG: IgG (Immunoglobin G), Serum: 1740 mg/dL — ABNORMAL HIGH (ref 600–1640)

## 2023-11-27 ENCOUNTER — Ambulatory Visit: Payer: Self-pay | Admitting: Gastroenterology

## 2023-11-27 ENCOUNTER — Encounter: Payer: Self-pay | Admitting: Family Medicine

## 2023-11-27 ENCOUNTER — Ambulatory Visit: Admitting: Family Medicine

## 2023-11-27 VITALS — BP 132/74 | HR 70 | Temp 98.2°F | Ht 65.0 in | Wt 236.0 lb

## 2023-11-27 DIAGNOSIS — F418 Other specified anxiety disorders: Secondary | ICD-10-CM

## 2023-11-27 DIAGNOSIS — Z0000 Encounter for general adult medical examination without abnormal findings: Secondary | ICD-10-CM

## 2023-11-27 DIAGNOSIS — R1013 Epigastric pain: Secondary | ICD-10-CM

## 2023-11-27 DIAGNOSIS — R7989 Other specified abnormal findings of blood chemistry: Secondary | ICD-10-CM

## 2023-11-27 DIAGNOSIS — K760 Fatty (change of) liver, not elsewhere classified: Secondary | ICD-10-CM

## 2023-11-27 DIAGNOSIS — N644 Mastodynia: Secondary | ICD-10-CM

## 2023-11-27 DIAGNOSIS — R7689 Other specified abnormal immunological findings in serum: Secondary | ICD-10-CM

## 2023-11-27 DIAGNOSIS — R748 Abnormal levels of other serum enzymes: Secondary | ICD-10-CM

## 2023-11-27 DIAGNOSIS — R070 Pain in throat: Secondary | ICD-10-CM

## 2023-11-27 MED ORDER — NA SULFATE-K SULFATE-MG SULF 17.5-3.13-1.6 GM/177ML PO SOLN: 1.0000 | Freq: Once | ORAL | 0 refills | Status: AC

## 2023-11-27 NOTE — Progress Notes (Signed)
 Established Patient Office Visit   Subjective  Patient ID: [name], female    DOB: 12-07-1975  Age: 48 y.o. MRN: [id number]  Chief Complaint  Patient presents with   Annual Exam    Pt is a 48 yo female seen for CPE and f/u on ongoing issues.  Pt is not fasting this visit.  Had appt with GI for bloating, abd pain s/p H. pylori infection treatment.  Concern for autoimmune hepatitis causing elevation in LFTs.  Labs done 10/31 with positive ANA and IgG.  Has f/u with GI in Dec.  Has a h/o chronic joint pain since 2002, affecting her knees, feet, and hands. She has been tested for rheumatoid arthritis, but not lupus. Has family history of lupus and sarcoidosis on her maternal side (maternal great uncle and his daughter). She experiences rashes and itching when exposed to the sun, previously attributed to eczema.  Endorses a sharp, annoying pain under her breast, lasting about a week and relieved by applying pressure. Pain noted near menses.  The pain stopped three days ago. She consumes coffee and spicy foods, which may exacerbate her symptoms.   Seeing a neurosurgeon for issues related to a previous anterior cervical decompression discectomy fusion surgery involving two plates in her neck. She experiences pressure and discomfort with certain movements and positions.  At baseline has a sharp sensation in throat since 2nd surgery.    Patient Active Problem List   Diagnosis Date Noted   Cervical radiculopathy 10/18/2022   Mixed anxiety and depressive disorder 05/31/2018   Moderate asthma 05/31/2018   Cough variant asthma vs UACS 09/25/2017   Pregnancy 07/15/2016   SVD (spontaneous vaginal delivery) 07/15/2016   Maternal age 88+, multigravida, antepartum 01/12/2016   Sciatica 01/12/2016   Gallstone 01/01/2016   Nonspecific abnormal finding in stool contents 10/15/2013   Abdominal pain, epigastric 10/15/2013   Bowel habit changes 10/15/2013   Internal hemorrhoids 10/15/2013   Active  labor 02/05/2013   OBESITY 09/02/2008   GERD 09/02/2008   Constipation 09/02/2008   ANAL FISSURE 09/02/2008   Arthropathy 09/02/2008   Past Medical History:  Diagnosis Date   Allergy     Anal fissure    Arthritis    Asthma    Blood in stool    Cholelithiasis    Deciduitis 2024   Depression    Diverticulosis    GERD (gastroesophageal reflux disease)    Hypertension    IBS (irritable bowel syndrome)    Liver hemangioma    Pneumonia    as a child   Pre-diabetes    SVD (spontaneous vaginal delivery) 07/15/2016   Ulcer    Past Surgical History:  Procedure Laterality Date   ANTERIOR CERVICAL DECOMP/DISCECTOMY FUSION N/A 10/18/2022   Procedure: Anterior Cervical Decompression Fusion Cervical five-six, Cervical six-seven, Removal CERVICAL PLATE;  Surgeon: Debby Dorn MATSU, MD;  Location: [facility] OR;  Service: Neurosurgery;  Laterality: N/A;   CERVICAL DISCECTOMY  03-2008   CHOLECYSTECTOMY N/A 04/21/2017   Procedure: LAPAROSCOPIC CHOLECYSTECTOMY;  Surgeon: Signe Mitzie LABOR, MD;  Location: [facility] OR;  Service: General;  Laterality: N/A;   COLONOSCOPY  2010   WISDOM TOOTH EXTRACTION     Social History   Tobacco Use   Smoking status: Never    Passive exposure: Yes   Smokeless tobacco: Never   Tobacco comments:    grandmother smoked in home as a child  Vaping Use   Vaping status: Never Used  Substance Use Topics   Alcohol use: Yes  Alcohol/week: 3.0 standard drinks of alcohol    Types: 3 Shots of liquor per week    Comment: 3 drinks a weeks   Drug use: No   Family History  Problem Relation Age of Onset   Breast cancer Maternal Grandmother    Ovarian cancer Mother    Diabetes Paternal Grandmother    Hypertension Father    Seizures Father    Colon cancer Neg Hx    Esophageal cancer Neg Hx    Stomach cancer Neg Hx    Kidney disease Neg Hx    Liver disease Neg Hx    Allergies  Allergen Reactions   Hydrocodone Itching   Acetaminophen  Hives and Itching   Guaifenesin Hives  and Itching   Guaifenesin Er Hives    Mucinex   Hydrocodone-Acetaminophen  Itching   Iodinated Contrast Media Dermatitis   Pseudoephedrine Hives, Itching and Other (See Comments)    Burning  Sudafed   Robitussin Dm Max Day-Night Hives    Robitussin   Shellfish Allergy  Hives and Itching   Antihistamines, Chlorpheniramine-Type Anxiety    ROS Negative unless stated above    Objective:     LMP 04/10/2020 (Approximate)  BP Readings from Last 3 Encounters:  11/10/23 116/72  09/21/23 120/88  09/19/23 110/80   Wt Readings from Last 3 Encounters:  11/10/23 231 lb 6 oz (105 kg)  09/21/23 229 lb 3.2 oz (104 kg)  09/19/23 228 lb 14.4 oz (103.8 kg)      Physical Exam Constitutional:      Appearance: Normal appearance.  HENT:     Head: Normocephalic and atraumatic.     Right Ear: Tympanic membrane, ear canal and external ear normal.     Left Ear: Tympanic membrane, ear canal and external ear normal.     Nose: Nose normal.     Mouth/Throat:     Mouth: Mucous membranes are moist.     Pharynx: No oropharyngeal exudate or posterior oropharyngeal erythema.  Eyes:     General: No scleral icterus.    Extraocular Movements: Extraocular movements intact.     Conjunctiva/sclera: Conjunctivae normal.     Pupils: Pupils are equal, round, and reactive to light.  Neck:     Thyroid : No thyromegaly.     Vascular: No carotid bruit.  Cardiovascular:     Rate and Rhythm: Normal rate and regular rhythm.     Pulses: Normal pulses.     Heart sounds: Normal heart sounds. No murmur heard.    No friction rub.  Pulmonary:     Effort: Pulmonary effort is normal.     Breath sounds: Normal breath sounds. No wheezing, rhonchi or rales.  Abdominal:     General: Bowel sounds are normal.     Palpations: Abdomen is soft.     Tenderness: There is no abdominal tenderness.  Musculoskeletal:        General: No deformity. Normal range of motion.  Lymphadenopathy:     Cervical: No cervical adenopathy.   Skin:    General: Skin is warm and dry.     Findings: No lesion.  Neurological:     General: No focal deficit present.     Mental Status: She is alert and oriented to person, place, and time.  Psychiatric:        Mood and Affect: Mood normal.        Thought Content: Thought content normal.        11/27/2023    4:29 PM 09/21/2023   10:27  AM 03/15/2023    4:46 PM  Depression screen PHQ 2/9  Decreased Interest 2 2 2   Down, Depressed, Hopeless 2 1 2   PHQ - 2 Score 4 3 4   Altered sleeping 2 3 1   Tired, decreased energy 3 3 3   Change in appetite 1 1 0  Feeling bad or failure about yourself  0 0 0  Trouble concentrating 3 1 3   Moving slowly or fidgety/restless 1 1 0  Suicidal thoughts 0 0 0  PHQ-9 Score 14 12  11    Difficult doing work/chores Somewhat difficult Somewhat difficult      Data saved with a previous flowsheet row definition      11/27/2023    4:29 PM 09/21/2023   10:27 AM 03/15/2023    4:46 PM  GAD 7 : Generalized Anxiety Score  Nervous, Anxious, on Edge 1 1 1   Control/stop worrying 1 1 0  Worry too much - different things 1  0  Trouble relaxing 1 2 0  Restless 1 1 0  Easily annoyed or irritable 1 3 3   Afraid - awful might happen 1 2 2   Total GAD 7 Score 7  6  Anxiety Difficulty Somewhat difficult Somewhat difficult Somewhat difficult     No results found for any visits on 11/27/23.    Assessment & Plan:   Well adult exam -     CBC with Differential/Platelet; Future -     Comprehensive metabolic panel with GFR; Future -     Hemoglobin A1c; Future -     Lipid panel; Future -     TSH; Future -     Vitamin B12; Future -     T4, free; Future -     VITAMIN D 25 Hydroxy (Vit-D Deficiency, Fractures); Future  Mixed anxiety and depressive disorder -     TSH; Future -     T4, free; Future  Breast pain, right  ANA positive -     Ambulatory referral to Rheumatology  Elevated LFTs  Abdominal pain, epigastric  Pain in throat  Routine wellness visit  focused on general health maintenance and lifestyle modifications.  Order labs.  Immunizations reviewed.  Patient declines at this time.  Pt to schedule Pap and mammogram.  Colonoscopy done 05/09/2017, repeat in 2029.  Mastodynia   Intermittent sharp pain under the breast, likely related to fibrocystic breast tissue, worsens with caffeine intake. Reduce caffeine to alleviate symptoms and monitor for changes in breast tissue or pain pattern.  Abd pain, bloating   Elevated LFTs, bloating, abd pain s/p H. Pylori treatment.  Also w/ h/o IBS.  Recent blood work 10/31 with positive ANA and IgG.  Follow up with gastroenterologist. Continue to abstain from alcohol to assess impact on liver function.    Throat/Neck pain   Persistent sharp pain in neck/throat s/p previous surgery, with concerns about plate movement and potential cartilage growth. Continue follow up with neurosurgeon for further evaluation and management. Obtain additional imaging studies as recommended.  Abnormal immunological findings    Abnormal immunological findings, including elevated ANA, IgG, and liver enzymes.  Concern for possible autoimmune disorder given personal h/o jt pain, skin changes after sun exposure and fam h/o SLE and sarcoidosis. Referred to rheumatologist for further evaluation. Consider repeat immunological testing as recommended.  Anxiety/depression Stable.  PHQ-9 score 14 and GAD-7 score 7 this visit.  Not on meds.  Continue self care.  Continue to monitor.  Return in about 1 year (around 11/26/2024) for physical.  F/u in the next few months for chronic issues.  Clotilda JONELLE Single, MD

## 2023-11-29 ENCOUNTER — Other Ambulatory Visit: Payer: Self-pay

## 2023-11-29 ENCOUNTER — Other Ambulatory Visit

## 2023-11-29 DIAGNOSIS — Z006 Encounter for examination for normal comparison and control in clinical research program: Secondary | ICD-10-CM

## 2023-12-11 LAB — GENECONNECT MOLECULAR SCREEN: Genetic Analysis Overall Interpretation: NEGATIVE

## 2023-12-29 ENCOUNTER — Ambulatory Visit: Admitting: Gastroenterology

## 2023-12-29 ENCOUNTER — Encounter: Payer: Self-pay | Admitting: Gastroenterology

## 2023-12-29 VITALS — BP 120/74 | HR 86 | Ht 65.0 in | Wt 235.0 lb

## 2023-12-29 DIAGNOSIS — K625 Hemorrhage of anus and rectum: Secondary | ICD-10-CM | POA: Diagnosis not present

## 2023-12-29 DIAGNOSIS — A048 Other specified bacterial intestinal infections: Secondary | ICD-10-CM

## 2023-12-29 DIAGNOSIS — E611 Iron deficiency: Secondary | ICD-10-CM

## 2023-12-29 DIAGNOSIS — B9681 Helicobacter pylori [H. pylori] as the cause of diseases classified elsewhere: Secondary | ICD-10-CM | POA: Diagnosis not present

## 2023-12-29 DIAGNOSIS — K5900 Constipation, unspecified: Secondary | ICD-10-CM

## 2023-12-29 DIAGNOSIS — R748 Abnormal levels of other serum enzymes: Secondary | ICD-10-CM | POA: Diagnosis not present

## 2023-12-29 DIAGNOSIS — K648 Other hemorrhoids: Secondary | ICD-10-CM | POA: Diagnosis not present

## 2023-12-29 DIAGNOSIS — R140 Abdominal distension (gaseous): Secondary | ICD-10-CM | POA: Diagnosis not present

## 2023-12-29 DIAGNOSIS — K219 Gastro-esophageal reflux disease without esophagitis: Secondary | ICD-10-CM | POA: Diagnosis not present

## 2023-12-29 DIAGNOSIS — K5909 Other constipation: Secondary | ICD-10-CM | POA: Diagnosis not present

## 2023-12-29 DIAGNOSIS — K760 Fatty (change of) liver, not elsewhere classified: Secondary | ICD-10-CM

## 2023-12-29 NOTE — Progress Notes (Signed)
 "  [name] [id number] [birth year]   Chief Complaint: Bloating, rectal bleeding, abnormal labs  Referring Provider: Mercer Clotilda SAUNDERS, MD Primary GI MD: Dr. Wilhelmenia  HPI: [name] is a 48 y.o. female with past medical history of anal fissure, cholecystectomy, depression, GERD, HTN, prediabetes who presents today for follow up.  Seen by PCP 09/21/2023 for follow-up of abdominal pain and bloating and blood in stool.  Symptoms felt similar to previous episode of H. pylori.  Stool studies were positive.  Had been seen at urgent care and CT A/P with contrast showed diverticulosis, subcentimeter hypodense bilateral renal lesions too small to characterize, likely benign, and a small fat-containing umbilical hernia. She was treated bismuth /metronidazole/tetracycline and pantoprazole , started treatment 09/21/2023.   Recent surgery for nasopharyngeal mass.   Labs 09/19/2023: Normal CBC, vitamin B12, folate.  Iron panel shows ferritin of 22, iron saturation borderline at 16%.   Has had mild elevation in ALT over the past year.  Patient seen in office 11/10/2023, reporting intermittent abdominal pain and bloating associated with dietary intake.  Noticing intermittent blood in her stool which she attributed to hemorrhoids.  Recent episode of rectal bleeding was more significant than usual.  Was seen in urgent care and had CT scan which showed no acute findings.  No evidence of anemia though did have ferritin of 22 and iron saturation borderline at 16%.  Last colonoscopy 2019 with finding of internal hemorrhoids otherwise normal with recommended recall in 10 years. On exam in office had internal hemorrhoids with stigmata of bleeding as well as external hemorrhoids.  She was treated with Anusol  suppositories, advised to trial low FODMAP diet, fiber supplementation, Gas-X/Phazyme, with consideration for empiric treatment for SIBO if no improvement. Also found recently be positive for H. pylori,  treated with Pylera in September and had not had eradication study so that was ordered. Noted to have mild elevation in ALT over the previous year, and on RUQ ultrasound in March found to have fatty infiltration of the liver.  Further serologic workup ordered to rule out other causes of liver disease.  Fibrosis 4 score 0.69.  Labs 11/10/2023:  Negative for viral hepatitis.  ANA positive low titer.  Normal PT/INR.  Iron panel consistent with iron deficiency anemia and EGD/colonoscopy recommended along with OTC ferrous sulfate 325 mg daily. Liver enzymes mildly elevated.  Negative Wilson's disease, alpha-1 antitrypsin deficiency, celiac disease, thyroid  dysfunction. She did have a positive ASMA.  Reviewed with Dr. Wilhelmenia who advised checking IgG level and having patient completely avoid alcohol consumption for 2 months then repeat immunoglobulins and ASMA again, with liver biopsy if those labs remain elevated. She is scheduled for EGD/colonoscopy 01/12/2024.  IgG did come back mildly elevated.   Discussed the use of AI scribe software for clinical note transcription with the patient, who gave verbal consent to proceed.  History of Present Illness [name] is a 48 year old female with chronic constipation who presents for follow-up and pre-procedure evaluation.  Chronic constipation and abdominal bloating - Constipation present since 2004, initially severe and persistent since onset - Bowel movements remain infrequent and hard, often requiring dietary manipulation for passage - Recent change in timing of bowel movements to later in the day - Not currently using fiber supplements or laxatives - No consistent increase in dietary fiber intake - Significant lower abdominal bloating, more pronounced recently - Difficulty passing gas, with improvement after bowel movements - Certain foods (kiwi, grapefruit) provide relief; pasta worsens symptoms -  Considering dietary modifications,  including a low FODMAP diet  Rectal bleeding and internal hemorrhoids - Intermittent rectal bleeding - Suppositories for internal hemorrhoids not used consistently due to difficulty with administration - Attempts to use suppositories at night as advised - No episodes of significant or persistent bleeding  Helicobacter pylori infection - Tested positive for H. pylori infection - Upper endoscopy and colonoscopy scheduled for further evaluation - Has not completed eradication study  Hepatic steatosis and liver function abnormalities - Hepatic steatosis identified on ultrasound - Mildly elevated liver tests on prior laboratory studies - Abstinent from alcohol for approximately one month  Iron deficiency anemia - Borderline anemia with low iron and iron stores on recent laboratory studies - Not currently taking iron supplements - Menstrual cycles are absent  Gastroesophageal reflux and heartburn - Occasional reflux and heartburn, most recently a couple of days ago and last night - Takes pantoprazole  and uses famotidine  as needed   Previous GI Procedures/Imaging   RUQ US  03/16/2023 Fatty liver infiltration. Previous echogenic liver lesion not seen on today's ultrasound. If there is further concern additional cross-sectional study with with dynamic imaging such as MRI could be considered as clinically appropriate.   Previous cholecystectomy.  No ductal dilatation.   Colonoscopy 05/09/2017 - Internal hemorrhoids.  - The examination was otherwise normal on direct and retroflexion views.  - No specimens collected. - Recall 10 years   EGD 09/10/2008 Mild gastritis in the body in the antrum of the stomach Normal duodenum Normal esophagus Occult stricture dilated   Past Medical History:  Diagnosis Date   Allergy     Anal fissure    Arthritis    Asthma    Blood in stool    Cholelithiasis    Deciduitis 2024   Depression    Diverticulosis    GERD (gastroesophageal reflux  disease)    Hypertension    IBS (irritable bowel syndrome)    Liver hemangioma    Pneumonia    as a child   Pre-diabetes    SVD (spontaneous vaginal delivery) 07/15/2016   Ulcer     Past Surgical History:  Procedure Laterality Date   ANTERIOR CERVICAL DECOMP/DISCECTOMY FUSION N/A 10/18/2022   Procedure: Anterior Cervical Decompression Fusion Cervical five-six, Cervical six-seven, Removal CERVICAL PLATE;  Surgeon: Debby Dorn MATSU, MD;  Location: [facility] OR;  Service: Neurosurgery;  Laterality: N/A;   CERVICAL DISCECTOMY  03-2008   CHOLECYSTECTOMY N/A 04/21/2017   Procedure: LAPAROSCOPIC CHOLECYSTECTOMY;  Surgeon: Signe Mitzie LABOR, MD;  Location: [facility] OR;  Service: General;  Laterality: N/A;   COLONOSCOPY  2010   WISDOM TOOTH EXTRACTION      Current Outpatient Medications  Medication Sig Dispense Refill   albuterol  (PROVENTIL  HFA;VENTOLIN  HFA) 108 (90 Base) MCG/ACT inhaler Inhale 1-2 puffs into the lungs every 6 (six) hours as needed for wheezing or shortness of breath.     albuterol  (VENTOLIN  HFA) 108 (90 Base) MCG/ACT inhaler Inhale 2 puffs into the lungs every 6 (six) hours as needed for wheezing or shortness of breath. 8 g 2   baclofen (LIORESAL) 10 MG tablet Take 10 mg by mouth daily as needed for muscle spasms.     budesonide -formoterol  (SYMBICORT ) 80-4.5 MCG/ACT inhaler Take 2 puffs first thing in am and then another 2 puffs about 12 hours later. 1 Inhaler 11   Cetirizine HCl (ZYRTEC ALLERGY ) 10 MG CAPS      Clocortolone Pivalate (CLODERM) 0.1 % cream      diclofenac  sodium (VOLTAREN ) 1 % GEL Apply  2-4 g topically 4 (four) times daily as needed (for pain.).      famotidine  (PEPCID ) 20 MG tablet Take 20 mg by mouth.     fluticasone  (FLONASE ) 50 MCG/ACT nasal spray Place 1 spray into both nostrils daily. 16 g 0   gabapentin  (NEURONTIN ) 600 MG tablet Take 600 mg by mouth 2 (two) times daily as needed (for pain.).      hydrocortisone  (ANUSOL -HC) 25 MG suppository Insert one suppository  into the rectum at bedtime for 7-10 days 10 suppository 1   methocarbamol  (ROBAXIN ) 500 MG tablet Take 500 mg by mouth.     montelukast  (SINGULAIR ) 10 MG tablet Take 1 tablet (10 mg total) by mouth daily. 30 tablet 2   pantoprazole  (PROTONIX ) 40 MG tablet Take 1 tablet (40 mg total) by mouth 2 (two) times daily for 10 days. 20 tablet 0   predniSONE  (DELTASONE ) 10 MG tablet Take 4 tabs every morning for 3 days, 3 tabs for 2 days, 2 tabs for 2 days, 1 tab for 1 day. 23 tablet 0   tobramycin (TOBREX) 0.3 % ophthalmic solution 1 drop into affected eye Ophthalmic every 4 hrs     triamcinolone  ointment (KENALOG ) 0.1 % Apply 1 Application topically daily. 80 g 1   No current facility-administered medications for this visit.    Allergies as of 12/29/2023 - Review Complete 12/29/2023  Allergen Reaction Noted   Hydrocodone Itching 07/08/2010   Acetaminophen  Hives and Itching    Guaifenesin Hives and Itching 09/02/2008   Guaifenesin er Hives 11/07/2023   Hydrocodone-acetaminophen  Itching 08/19/2008   Iodinated contrast media Dermatitis 05/01/2019   Pseudoephedrine Hives, Itching, and Other (See Comments) 03/29/2004   Robitussin dm max day-night Hives 11/07/2023   Shellfish allergy  Hives and Itching 02/05/2013   Antihistamines, chlorpheniramine-type Anxiety 03/14/2008    Family History  Problem Relation Age of Onset   Breast cancer Maternal Grandmother    Ovarian cancer Mother    Diabetes Paternal Grandmother    Hypertension Father    Seizures Father    Colon cancer Neg Hx    Esophageal cancer Neg Hx    Stomach cancer Neg Hx    Kidney disease Neg Hx    Liver disease Neg Hx     Social History[1]   Review of Systems:    Constitutional: No weight loss, fever, chills Cardiovascular: No chest pain Respiratory: No SOB  Gastrointestinal: See HPI and otherwise negative   Physical Exam:  Vital signs: BP 120/74   Pulse 86   Ht 5' 5 (1.651 m)   Wt 235 lb (106.6 kg)   LMP 04/10/2020    BMI 39.11 kg/m   Wt Readings from Last 3 Encounters:  12/29/23 235 lb (106.6 kg)  11/27/23 236 lb (107 kg)  11/10/23 231 lb 6 oz (105 kg)   Constitutional: Pleasant, obese female in NAD, alert and cooperative Head:  Normocephalic and atraumatic.  Respiratory: Respirations even and unlabored. Lungs clear to auscultation bilaterally.  No wheezes, crackles, or rhonchi.  Cardiovascular:  Regular rate and rhythm. No murmurs. No peripheral edema. Gastrointestinal:  Soft, nondistended, minimally tender to palpation of RLQ. No rebound or guarding. Normal bowel sounds. No appreciable masses or hepatomegaly. Rectal:  Not performed.  Neurologic:  Alert and oriented x4;  grossly normal neurologically.  Skin:   Dry and intact without significant lesions or rashes. Psychiatric: Oriented to person, place and time. Demonstrates good judgement and reason without abnormal affect or behaviors.   RELEVANT LABS AND IMAGING: CBC  Component Value Date/Time   WBC 4.9 11/10/2023 0955   RBC 4.20 11/10/2023 0955   HGB 12.2 11/10/2023 0955   HCT 37.7 11/10/2023 0955   PLT 283.0 11/10/2023 0955   MCV 89.8 11/10/2023 0955   MCH 31.3 06/14/2023 2354   MCHC 32.3 11/10/2023 0955   RDW 14.4 11/10/2023 0955   LYMPHSABS 1.6 11/10/2023 0955   MONOABS 0.4 11/10/2023 0955   EOSABS 0.1 11/10/2023 0955   BASOSABS 0.0 11/10/2023 0955    CMP     Component Value Date/Time   NA 140 11/10/2023 0955   K 4.0 11/10/2023 0955   CL 104 11/10/2023 0955   CO2 28 11/10/2023 0955   GLUCOSE 94 11/10/2023 0955   BUN 11 11/10/2023 0955   CREATININE 0.83 11/10/2023 0955   CALCIUM 9.4 11/10/2023 0955   PROT 8.0 11/10/2023 0955   ALBUMIN 4.4 11/10/2023 0955   AST 47 (H) 11/10/2023 0955   ALT 73 (H) 11/10/2023 0955   ALKPHOS 98 11/10/2023 0955   BILITOT 0.7 11/10/2023 0955   GFRNONAA >60 06/14/2023 2354   GFRAA >60 04/13/2017 1131     Assessment/Plan:   Assessment & Plan Chronic constipation Persistent  constipation with intermittent.  Not currently taking any kind of bowel regimen.  Eats greasy foods as needed which alleviates constipation but can lead to diarrhea.  - Initiated fiber supplement (Benefiber or Metamucil). - Advised dietary fiber intake of 25-30 grams daily. - Encouraged increased water intake. - Provided guidance on regular fiber supplementation and hydration. - Consider MiraLAX  or Linzess in future if needed.  Internal hemorrhoids with rectal bleeding Intermittent rectal bleeding.  Tried suppositories briefly but was not using as prescribed.  Instructed on proper use.  - Encouraged retrying suppository use with improved technique. - Planned evaluation during upcoming colonoscopy. - Discussed potential hemorrhoid banding if bleeding persists.  Abdominal bloating and pain Symptoms fluctuate with bowel movements and diet; possible causes include constipation, dietary triggers, or SIBO.  - Recommended low FODMAP diet and provided handout. - Advised dietary modification to avoid exacerbating foods. - Planned upper endoscopy and colonoscopy for further evaluation. - Discussed empiric SIBO treatment with antibiotics if symptoms persist.  Helicobacter pylori infection Patient recently tested positive for H. pylori and was treated.  Stool eradication test had been ordered at last visit but patient did not complete.  Has upcoming EGD in just a couple weeks so can plan for gastric biopsies at that time.  - Plan for gastric biopsies for H. pylori during upcoming EGD.  Hepatic steatosis Suspected MASLD with mild liver enzyme elevation; possible fatty liver or autoimmune liver disease.   -Planning to repeat liver function tests as well as IgG and ASMA after two months of alcohol abstinence - Discussed potential liver biopsy if enzyme elevation persists.  Iron deficiency Iron deficiency recently identified with low iron and ferritin; gastrointestinal blood loss considered.   Planning on further evaluation with EGD and colonoscopy next month.  Has not started iron supplementation.  - Recommended iron supplementation. - Planned repeat iron studies at follow-up after procedures.  Gastroesophageal reflux disease Intermittent symptoms managed with pantoprazole  and famotidine .  - Advised continuation of pantoprazole . - Recommended famotidine  as needed for breakthrough symptoms.   Camie Furbish, PA-C [facility] 12/29/2023, 9:26 AM  Patient Care Team: Mercer Clotilda SAUNDERS, MD as PCP - General (Family Medicine) Danielle Rom, MD as Consulting Physician (Obstetrics and Gynecology)       [1]  Social History Tobacco Use   Smoking status:  Never    Passive exposure: Yes   Smokeless tobacco: Never   Tobacco comments:    grandmother smoked in home as a child  Vaping Use   Vaping status: Never Used  Substance Use Topics   Alcohol use: Not Currently   Drug use: No   "

## 2023-12-29 NOTE — Patient Instructions (Addendum)
 A high fiber diet with plenty of fluids (up to 8 glasses of water daily) is suggested to relieve these symptoms.  Benefiber, 1 tablespoon daily can be used to keep bowels regular.  Start iron supplement - Ferrous Sulfate 325 mg daily.   Get labs done in one month (around 01/29/24) : HFP, CBC, iron/ferritin, IgG, ASMA

## 2023-12-30 NOTE — Progress Notes (Signed)
"   Attending Physician's Attestation   I have reviewed the chart.   I agree with the Advanced Practitioner's note, impression, and recommendations with any updates as below. Symptoms seem to be constipation related based on history.  Optimization of bowel habits will be important.  Followup post E/C will guide things as well.  Reasonable approach to consider SIBO evaluation pending the workup outlined.  LFT workup still ongoing.   Aloha Finner, MD  Gastroenterology Advanced Endoscopy Office # [phone]  "

## 2024-01-12 ENCOUNTER — Ambulatory Visit: Admitting: Gastroenterology

## 2024-01-12 ENCOUNTER — Encounter: Payer: Self-pay | Admitting: Gastroenterology

## 2024-01-12 VITALS — BP 125/80 | HR 76 | Temp 97.4°F | Resp 12 | Ht 65.0 in | Wt 235.0 lb

## 2024-01-12 DIAGNOSIS — K449 Diaphragmatic hernia without obstruction or gangrene: Secondary | ICD-10-CM | POA: Diagnosis not present

## 2024-01-12 DIAGNOSIS — Z1211 Encounter for screening for malignant neoplasm of colon: Secondary | ICD-10-CM

## 2024-01-12 DIAGNOSIS — R1310 Dysphagia, unspecified: Secondary | ICD-10-CM

## 2024-01-12 DIAGNOSIS — K625 Hemorrhage of anus and rectum: Secondary | ICD-10-CM | POA: Diagnosis not present

## 2024-01-12 DIAGNOSIS — K2950 Unspecified chronic gastritis without bleeding: Secondary | ICD-10-CM

## 2024-01-12 DIAGNOSIS — K229 Disease of esophagus, unspecified: Secondary | ICD-10-CM

## 2024-01-12 DIAGNOSIS — K2289 Other specified disease of esophagus: Secondary | ICD-10-CM | POA: Diagnosis not present

## 2024-01-12 DIAGNOSIS — A048 Other specified bacterial intestinal infections: Secondary | ICD-10-CM

## 2024-01-12 DIAGNOSIS — K642 Third degree hemorrhoids: Secondary | ICD-10-CM | POA: Diagnosis not present

## 2024-01-12 MED ORDER — SODIUM CHLORIDE 0.9 % IV SOLN: 500.0000 mL | Freq: Once | INTRAVENOUS | Status: DC

## 2024-01-12 NOTE — Op Note (Signed)
 [facility] Patient Name: [name] Procedure Date: 01/12/2024 2:44 PM MRN: [id number] Endoscopist: Aloha Finner , MD, [id number] Age: 49 Referring MD:  Date of Birth: [birth year] Gender: Female Account #: [id number] Procedure:                Colonoscopy Indications:              Screening for colorectal malignant neoplasm,                            Incidental BRBPR Medicines:                Monitored Anesthesia Care Procedure:                Pre-Anesthesia Assessment:                           - Prior to the procedure, a History and Physical                            was performed, and patient medications and                            allergies were reviewed. The patient's tolerance of                            previous anesthesia was also reviewed. The risks                            and benefits of the procedure and the sedation                            options and risks were discussed with the patient.                            All questions were answered, and informed consent                            was obtained. Prior Anticoagulants: The patient has                            taken no anticoagulant or antiplatelet agents. ASA                            Grade Assessment: II - A patient with mild systemic                            disease. After reviewing the risks and benefits,                            the patient was deemed in satisfactory condition to                            undergo the procedure.  After obtaining informed consent, the colonoscope                            was passed under direct vision. Throughout the                            procedure, the patient's blood pressure, pulse, and                            oxygen saturations were monitored continuously. The                            Olympus Scope SN: [id number] was introduced through                            the anus and advanced to the 3 cm into the  ileum.                            The colonoscopy was performed without difficulty.                            The patient tolerated the procedure. The quality of                            the bowel preparation was good. The terminal ileum,                            ileocecal valve, appendiceal orifice, and rectum                            were photographed. Scope In: 3:17:36 PM Scope Out: 3:25:29 PM Scope Withdrawal Time: 0 hours 6 minutes 17 seconds  Total Procedure Duration: 0 hours 7 minutes 53 seconds  Findings:                 The digital rectal exam findings include                            hemorrhoids. Pertinent negatives include no                            palpable rectal lesions.                           The terminal ileum and ileocecal valve appeared                            normal.                           Normal mucosa was found in the entire colon.                           Non-bleeding non-thrombosed internal hemorrhoids  were found during retroflexion, during perianal                            exam and during digital exam. The hemorrhoids were                            Grade III (internal hemorrhoids that prolapse but                            require manual reduction). Complications:            No immediate complications. Estimated Blood Loss:     Estimated blood loss was minimal. Impression:               - Hemorrhoids found on digital rectal exam.                           - The examined portion of the ileum was normal.                           - Normal mucosa in the entire examined colon.                           - Non-bleeding non-thrombosed internal hemorrhoids. Recommendation:           - The patient will be observed post-procedure,                            until all discharge criteria are met.                           - Discharge patient to home.                           - Patient has a contact number available for                             emergencies. The signs and symptoms of potential                            delayed complications were discussed with the                            patient. Return to normal activities tomorrow.                            Written discharge instructions were provided to the                            patient.                           - High fiber diet.                           - Use FiberCon 1-2 tablets PO daily.                           -  Continue present medications.                           - Repeat colonoscopy in 10 years for screening                            purposes.                           - If rectal bleeding becomes more pronounced, will                            discuss case with my partners to consider                            hemorrhoidal banding.                           - The findings and recommendations were discussed                            with the patient.                           - The findings and recommendations were discussed                            with the patient's family. Aloha Finner, MD 01/12/2024 3:32:01 PM

## 2024-01-12 NOTE — Progress Notes (Signed)
 "  GASTROENTEROLOGY PROCEDURE H&P NOTE   Primary Care Physician: Mercer Clotilda SAUNDERS, MD  HPI: [name] is a 49 y.o. female who presents for EGD/Colonoscopy for evaluation of abdominal bloating, history of HP and BRBPR.  Past Medical History:  Diagnosis Date   Allergy     Anal fissure    Arthritis    Asthma    Blood in stool    Cholelithiasis    Deciduitis 2024   Depression    Diverticulosis    GERD (gastroesophageal reflux disease)    Hypertension    IBS (irritable bowel syndrome)    Liver hemangioma    Pneumonia    as a child   Pre-diabetes    SVD (spontaneous vaginal delivery) 07/15/2016   Ulcer    Past Surgical History:  Procedure Laterality Date   ANTERIOR CERVICAL DECOMP/DISCECTOMY FUSION N/A 10/18/2022   Procedure: Anterior Cervical Decompression Fusion Cervical five-six, Cervical six-seven, Removal CERVICAL PLATE;  Surgeon: Debby Dorn MATSU, MD;  Location: [facility] OR;  Service: Neurosurgery;  Laterality: N/A;   CERVICAL DISCECTOMY  03-2008   CHOLECYSTECTOMY N/A 04/21/2017   Procedure: LAPAROSCOPIC CHOLECYSTECTOMY;  Surgeon: Signe Mitzie LABOR, MD;  Location: [facility] OR;  Service: General;  Laterality: N/A;   COLONOSCOPY  2010   WISDOM TOOTH EXTRACTION     Current Outpatient Medications  Medication Sig Dispense Refill   albuterol  (PROVENTIL  HFA;VENTOLIN  HFA) 108 (90 Base) MCG/ACT inhaler Inhale 1-2 puffs into the lungs every 6 (six) hours as needed for wheezing or shortness of breath.     albuterol  (VENTOLIN  HFA) 108 (90 Base) MCG/ACT inhaler Inhale 2 puffs into the lungs every 6 (six) hours as needed for wheezing or shortness of breath. 8 g 2   baclofen (LIORESAL) 10 MG tablet Take 10 mg by mouth daily as needed for muscle spasms.     budesonide -formoterol  (SYMBICORT ) 80-4.5 MCG/ACT inhaler Take 2 puffs first thing in am and then another 2 puffs about 12 hours later. 1 Inhaler 11   Cetirizine HCl (ZYRTEC ALLERGY ) 10 MG CAPS      Clocortolone Pivalate (CLODERM) 0.1 %  cream      diclofenac  sodium (VOLTAREN ) 1 % GEL Apply 2-4 g topically 4 (four) times daily as needed (for pain.).      famotidine  (PEPCID ) 20 MG tablet Take 20 mg by mouth.     fluticasone  (FLONASE ) 50 MCG/ACT nasal spray Place 1 spray into both nostrils daily. 16 g 0   gabapentin  (NEURONTIN ) 600 MG tablet Take 600 mg by mouth 2 (two) times daily as needed (for pain.).      hydrocortisone  (ANUSOL -HC) 25 MG suppository Insert one suppository into the rectum at bedtime for 7-10 days 10 suppository 1   methocarbamol  (ROBAXIN ) 500 MG tablet Take 500 mg by mouth.     montelukast  (SINGULAIR ) 10 MG tablet Take 1 tablet (10 mg total) by mouth daily. 30 tablet 2   pantoprazole  (PROTONIX ) 40 MG tablet Take 1 tablet (40 mg total) by mouth 2 (two) times daily for 10 days. 20 tablet 0   predniSONE  (DELTASONE ) 10 MG tablet Take 4 tabs every morning for 3 days, 3 tabs for 2 days, 2 tabs for 2 days, 1 tab for 1 day. 23 tablet 0   tobramycin (TOBREX) 0.3 % ophthalmic solution 1 drop into affected eye Ophthalmic every 4 hrs     triamcinolone  ointment (KENALOG ) 0.1 % Apply 1 Application topically daily. 80 g 1   Current Facility-Administered Medications  Medication Dose Route Frequency Provider  Last Rate Last Admin   0.9 %  sodium chloride  infusion  500 mL Intravenous Once Mansouraty, Aloha Raddle., MD       Current Medications[1] Allergies[2] Family History  Problem Relation Age of Onset   Breast cancer Maternal Grandmother    Ovarian cancer Mother    Diabetes Paternal Grandmother    Hypertension Father    Seizures Father    Colon cancer Neg Hx    Esophageal cancer Neg Hx    Stomach cancer Neg Hx    Kidney disease Neg Hx    Liver disease Neg Hx    Social History   Socioeconomic History   Marital status: Married    Spouse name: Not on file   Number of children: 3   Years of education: Not on file   Highest education level: Associate degree: occupational, scientist, product/process development, or vocational program   Occupational History   Not on file  Tobacco Use   Smoking status: Never    Passive exposure: Yes   Smokeless tobacco: Never   Tobacco comments:    grandmother smoked in home as a child  Vaping Use   Vaping status: Never Used  Substance and Sexual Activity   Alcohol use: Not Currently   Drug use: No   Sexual activity: Yes  Other Topics Concern   Not on file  Social History Narrative   Not on file   Social Drivers of Health   Tobacco Use: Medium Risk (01/12/2024)   Patient History    Smoking Tobacco Use: Never    Smokeless Tobacco Use: Never    Passive Exposure: Yes  Financial Resource Strain: Low Risk (09/22/2023)   Overall Financial Resource Strain (CARDIA)    Difficulty of Paying Living Expenses: Not hard at all  Food Insecurity: No Food Insecurity (09/22/2023)   Epic    Worried About Programme Researcher, Broadcasting/film/video in the Last Year: Never true    Ran Out of Food in the Last Year: Never true  Transportation Needs: No Transportation Needs (09/22/2023)   Epic    Lack of Transportation (Medical): No    Lack of Transportation (Non-Medical): No  Physical Activity: Insufficiently Active (09/22/2023)   Exercise Vital Sign    Days of Exercise per Week: 2 days    Minutes of Exercise per Session: 20 min  Stress: Stress Concern Present (09/22/2023)   [employer] of Occupational Health - Occupational Stress Questionnaire    Feeling of Stress: To some extent  Social Connections: Socially Integrated (09/22/2023)   Social Connection and Isolation Panel    Frequency of Communication with Friends and Family: More than three times a week    Frequency of Social Gatherings with Friends and Family: Once a week    Attends Religious Services: More than 4 times per year    Active Member of Clubs or Organizations: Yes    Attends Engineer, Structural: More than 4 times per year    Marital Status: Married  Catering Manager Violence: Not on file  Depression (PHQ2-9): High Risk (11/27/2023)    Depression (PHQ2-9)    PHQ-2 Score: 14  Alcohol Screen: Low Risk (09/22/2023)   Alcohol Screen    Last Alcohol Screening Score (AUDIT): 6  Housing: Low Risk (09/22/2023)   Epic    Unable to Pay for Housing in the Last Year: No    Number of Times Moved in the Last Year: 0    Homeless in the Last Year: No  Utilities: Not on file  Health  Literacy: Not on file    Physical Exam: Today's Vitals   01/12/24 1340  BP: 139/83  Pulse: 74  Temp: (!) 97.4 F (36.3 C)  TempSrc: Temporal  SpO2: 98%  Weight: 235 lb (106.6 kg)  Height: 5' 5 (1.651 m)   Body mass index is 39.11 kg/m. GEN: NAD EYE: Sclerae anicteric ENT: MMM CV: Non-tachycardic GI: Soft, NT/ND NEURO:  Alert & Oriented x 3  Lab Results: No results for input(s): WBC, HGB, HCT, PLT in the last 72 hours. BMET No results for input(s): NA, K, CL, CO2, GLUCOSE, BUN, CREATININE, CALCIUM in the last 72 hours. LFT No results for input(s): PROT, ALBUMIN, AST, ALT, ALKPHOS, BILITOT, BILIDIR, IBILI in the last 72 hours. PT/INR No results for input(s): LABPROT, INR in the last 72 hours.   Impression / Plan: This is a 49 y.o.female who presents for EGD/Colonoscopy for evaluation of abdominal bloating, history of HP and BRBPR.  The risks and benefits of endoscopic evaluation/treatment were discussed with the patient and/or family; these include but are not limited to the risk of perforation, infection, bleeding, missed lesions, lack of diagnosis, severe illness requiring hospitalization, as well as anesthesia and sedation related illnesses.  The patient's history has been reviewed, patient examined, no change in status, and deemed stable for procedure.  The patient and/or family was provided an opportunity to ask questions and all were answered.  The patient and/or family is agreeable to proceed.    Aloha Finner, MD [facility] Office #  [phone]     [1]  Current Outpatient Medications:    albuterol  (PROVENTIL  HFA;VENTOLIN  HFA) 108 (90 Base) MCG/ACT inhaler, Inhale 1-2 puffs into the lungs every 6 (six) hours as needed for wheezing or shortness of breath., Disp: , Rfl:    albuterol  (VENTOLIN  HFA) 108 (90 Base) MCG/ACT inhaler, Inhale 2 puffs into the lungs every 6 (six) hours as needed for wheezing or shortness of breath., Disp: 8 g, Rfl: 2   baclofen (LIORESAL) 10 MG tablet, Take 10 mg by mouth daily as needed for muscle spasms., Disp: , Rfl:    budesonide -formoterol  (SYMBICORT ) 80-4.5 MCG/ACT inhaler, Take 2 puffs first thing in am and then another 2 puffs about 12 hours later., Disp: 1 Inhaler, Rfl: 11   Cetirizine HCl (ZYRTEC ALLERGY ) 10 MG CAPS, , Disp: , Rfl:    Clocortolone Pivalate (CLODERM) 0.1 % cream, , Disp: , Rfl:    diclofenac  sodium (VOLTAREN ) 1 % GEL, Apply 2-4 g topically 4 (four) times daily as needed (for pain.). , Disp: , Rfl:    famotidine  (PEPCID ) 20 MG tablet, Take 20 mg by mouth., Disp: , Rfl:    fluticasone  (FLONASE ) 50 MCG/ACT nasal spray, Place 1 spray into both nostrils daily., Disp: 16 g, Rfl: 0   gabapentin  (NEURONTIN ) 600 MG tablet, Take 600 mg by mouth 2 (two) times daily as needed (for pain.). , Disp: , Rfl:    hydrocortisone  (ANUSOL -HC) 25 MG suppository, Insert one suppository into the rectum at bedtime for 7-10 days, Disp: 10 suppository, Rfl: 1   methocarbamol  (ROBAXIN ) 500 MG tablet, Take 500 mg by mouth., Disp: , Rfl:    montelukast  (SINGULAIR ) 10 MG tablet, Take 1 tablet (10 mg total) by mouth daily., Disp: 30 tablet, Rfl: 2   pantoprazole  (PROTONIX ) 40 MG tablet, Take 1 tablet (40 mg total) by mouth 2 (two) times daily for 10 days., Disp: 20 tablet, Rfl: 0   predniSONE  (DELTASONE ) 10 MG tablet, Take 4 tabs every  morning for 3 days, 3 tabs for 2 days, 2 tabs for 2 days, 1 tab for 1 day., Disp: 23 tablet, Rfl: 0   tobramycin (TOBREX) 0.3 % ophthalmic solution, 1 drop into affected eye  Ophthalmic every 4 hrs, Disp: , Rfl:    triamcinolone  ointment (KENALOG ) 0.1 %, Apply 1 Application topically daily., Disp: 80 g, Rfl: 1  Current Facility-Administered Medications:    0.9 %  sodium chloride  infusion, 500 mL, Intravenous, Once, Mansouraty, Audria Takeshita Jr., MD [2]  Allergies Allergen Reactions   Hydrocodone Itching   Acetaminophen  Hives and Itching   Guaifenesin Hives and Itching   Guaifenesin Er Hives    Mucinex   Hydrocodone-Acetaminophen  Itching   Iodinated Contrast Media Dermatitis   Pseudoephedrine Hives, Itching and Other (See Comments)    Burning  Sudafed   Robitussin Dm Max Day-Night Hives    Robitussin   Shellfish Allergy  Hives and Itching   Antihistamines, Chlorpheniramine-Type Anxiety   "

## 2024-01-12 NOTE — Patient Instructions (Addendum)
 Thank you for letting us  care for your healthcare needs today! Please see handouts regarding Post-Dilation Diet, Hiatal Hernia, High Fiber Diet, Hemorrhoidal Banding and Hemorrhoids.  - Use Cepacol or Halls Lozenges +/- Chloraseptic spray for next 72-96 hours if sore throat - Continue present medications - Post Dilation Diet: Clear liquids until 4:30 PM, Normal diet after 4:30 PM - High Fiber Diet - Use FiberCon 1-2 tablets by mouth daily  YOU HAD AN ENDOSCOPIC PROCEDURE TODAY AT THE [facility]:   Refer to the procedure report that was given to you for any specific questions about what was found during the examination.  If the procedure report does not answer your questions, please call your gastroenterologist to clarify.  If you requested that your care partner not be given the details of your procedure findings, then the procedure report has been included in a sealed envelope for you to review at your convenience later.  YOU SHOULD EXPECT: Some feelings of bloating in the abdomen. Passage of more gas than usual.  Walking can help get rid of the air that was put into your GI tract during the procedure and reduce the bloating. If you had a lower endoscopy (such as a colonoscopy or flexible sigmoidoscopy) you may notice spotting of blood in your stool or on the toilet paper. If you underwent a bowel prep for your procedure, you may not have a normal bowel movement for a few days.  Please Note:  You might notice some irritation and congestion in your nose or some drainage.  This is from the oxygen used during your procedure.  There is no need for concern and it should clear up in a day or so.  SYMPTOMS TO REPORT IMMEDIATELY:  Following lower endoscopy (colonoscopy or flexible sigmoidoscopy):  Excessive amounts of blood in the stool  Significant tenderness or worsening of abdominal pains  Swelling of the abdomen that is new, acute  Fever of 100F or higher  Following upper endoscopy  (EGD)  Vomiting of blood or coffee ground material  New chest pain or pain under the shoulder blades  Painful or persistently difficult swallowing  New shortness of breath  Fever of 100F or higher  Black, tarry-looking stools  For urgent or emergent issues, a gastroenterologist can be reached at any hour by calling [phone]. Do not use MyChart messaging for urgent concerns.    DIET:  We do recommend a small meal at first, but then you may proceed to your regular diet.  Drink plenty of fluids but you should avoid alcoholic beverages for 24 hours.  ACTIVITY:  You should plan to take it easy for the rest of today and you should NOT DRIVE or use heavy machinery until tomorrow (because of the sedation medicines used during the test).    FOLLOW UP: Our staff will call the number listed on your records the next business day following your procedure.  We will call around 7:15- 8:00 am to check on you and address any questions or concerns that you may have regarding the information given to you following your procedure. If we do not reach you, we will leave a message.     If any biopsies were taken you will be contacted by phone or by letter within the next 1-3 weeks.  Please call us  at [phone] if you have not heard about the biopsies in 3 weeks.    SIGNATURES/CONFIDENTIALITY: You and/or your care partner have signed paperwork which will be entered into  your electronic medical record.  These signatures attest to the fact that that the information above on your After Visit Summary has been reviewed and is understood.  Full responsibility of the confidentiality of this discharge information lies with you and/or your care-partner.

## 2024-01-12 NOTE — Progress Notes (Signed)
 Transferred to PACU via stretcher. Patient arousing to stimulation.  VSS upon leaving procedure room.

## 2024-01-12 NOTE — Op Note (Signed)
  [facility] Patient Name: [name] Procedure Date: 01/12/2024 2:45 PM MRN: [id number] Endoscopist: Aloha Finner , MD, [id number] Age: 49 Referring MD:  Date of Birth: 01-26-1975 Gender: Female Account #: [id number] Procedure:                Upper GI endoscopy Indications:              Dysphagia, Follow-up of Helicobacter pylori,                            Abdominal bloating, Globus sensation Medicines:                Monitored Anesthesia Care Procedure:                Pre-Anesthesia Assessment:                           - Prior to the procedure, a History and Physical                            was performed, and patient medications and                            allergies were reviewed. The patient's tolerance of                            previous anesthesia was also reviewed. The risks                            and benefits of the procedure and the sedation                            options and risks were discussed with the patient.                            All questions were answered, and informed consent                            was obtained. Prior Anticoagulants: The patient has                            taken no anticoagulant or antiplatelet agents. ASA                            Grade Assessment: II - A patient with mild systemic                            disease. After reviewing the risks and benefits,                            the patient was deemed in satisfactory condition to                            undergo the procedure.  After obtaining informed consent, the endoscope was                            passed under direct vision. Throughout the                            procedure, the patient's blood pressure, pulse, and                            oxygen saturations were monitored continuously. The                            Olympus Scope SN [id number] was introduced through the                            mouth, and  advanced to the second part of duodenum.                            The upper GI endoscopy was accomplished without                            difficulty. The patient tolerated the procedure. Scope In: Scope Out: Findings:                 No gross lesions were noted in the entire                            esophagus. Biopsies were taken with a cold forceps                            for histology. A guidewire was placed and the scope                            was withdrawn. Dilation was performed with a Savary                            dilator with no resistance at 16 mm and mild                            resistance at 18 mm. The dilation site was examined                            following endoscope reinsertion and showed no                            change.                           The Z-line was irregular and was found 35 cm from                            the incisors.  A 2 cm hiatal hernia was present.                           Diffuse moderately erythematous mucosa without                            bleeding was found in the gastric antrum.                           No other gross lesions were noted in the entire                            examined stomach. Biopsies were taken with a cold                            forceps for histology and Helicobacter pylori                            testing.                           No gross lesions were noted in the duodenal bulb,                            in the first portion of the duodenum and in the                            second portion of the duodenum. Complications:            No immediate complications. Estimated Blood Loss:     Estimated blood loss was minimal. Impression:               - No gross lesions in the entire esophagus.                            Biopsied. Dilated to 18 mm Savary.                           - Z-line irregular, 35 cm from the incisors.                           - 2 cm  hiatal hernia.                           - Erythematous mucosa in the antrum. No other gross                            lesions in the entire stomach. Biopsied.                           - No gross lesions in the duodenal bulb, in the                            first portion of the duodenum and in the second  portion of the duodenum. Recommendation:           - Proceed to scheduled colonoscopy.                           - Please use Cepacol or Halls Lozenges +/-                            Chloraseptic spray for next 72-96 hours to aid in                            sore thoat should you experience this.                           - Dilation diet as per protocol.                           - Continue present medications.                           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                           - The findings and recommendations were discussed                            with the patient.                           - The findings and recommendations were discussed                            with the patient's family. Aloha Finner, MD 01/12/2024 3:29:15 PM

## 2024-01-12 NOTE — Progress Notes (Signed)
 Called to room to assist during endoscopic procedure.  Patient ID and intended procedure confirmed with present staff. Received instructions for my participation in the procedure from the performing physician.

## 2024-01-15 ENCOUNTER — Telehealth: Payer: Self-pay

## 2024-01-15 NOTE — Telephone Encounter (Signed)
 No answer after follow up call. Voice message left.

## 2024-01-19 LAB — SURGICAL PATHOLOGY

## 2024-01-20 ENCOUNTER — Ambulatory Visit: Payer: Self-pay | Admitting: Gastroenterology

## 2024-01-22 ENCOUNTER — Other Ambulatory Visit: Payer: Self-pay

## 2024-01-22 DIAGNOSIS — A048 Other specified bacterial intestinal infections: Secondary | ICD-10-CM

## 2024-01-22 MED ORDER — TALICIA 250-12.5-10 MG PO CPDR: 4.0000 | DELAYED_RELEASE_CAPSULE | Freq: Three times a day (TID) | ORAL | 0 refills | Status: AC

## 2024-02-16 ENCOUNTER — Other Ambulatory Visit

## 2024-02-16 ENCOUNTER — Ambulatory Visit: Payer: Self-pay | Admitting: Gastroenterology

## 2024-02-16 DIAGNOSIS — R748 Abnormal levels of other serum enzymes: Secondary | ICD-10-CM

## 2024-02-16 LAB — CBC WITH DIFFERENTIAL/PLATELET
Basophils Absolute: 0 10*3/uL (ref 0.0–0.1)
Basophils Relative: 0.8 % (ref 0.0–3.0)
Eosinophils Absolute: 0.1 10*3/uL (ref 0.0–0.7)
Eosinophils Relative: 2.3 % (ref 0.0–5.0)
HCT: 35.5 % — ABNORMAL LOW (ref 36.0–46.0)
Hemoglobin: 11.8 g/dL — ABNORMAL LOW (ref 12.0–15.0)
Lymphocytes Relative: 42.7 % (ref 12.0–46.0)
Lymphs Abs: 1.8 10*3/uL (ref 0.7–4.0)
MCHC: 33.2 g/dL (ref 30.0–36.0)
MCV: 89 fl (ref 78.0–100.0)
Monocytes Absolute: 0.3 10*3/uL (ref 0.1–1.0)
Monocytes Relative: 6.5 % (ref 3.0–12.0)
Neutro Abs: 2 10*3/uL (ref 1.4–7.7)
Neutrophils Relative %: 47.7 % (ref 43.0–77.0)
Platelets: 310 10*3/uL (ref 150.0–400.0)
RBC: 3.99 Mil/uL (ref 3.87–5.11)
RDW: 13.7 % (ref 11.5–15.5)
WBC: 4.2 10*3/uL (ref 4.0–10.5)

## 2024-02-16 LAB — HEPATIC FUNCTION PANEL
ALT: 54 U/L — ABNORMAL HIGH (ref 3–35)
AST: 28 U/L (ref 5–37)
Albumin: 4.5 g/dL (ref 3.5–5.2)
Alkaline Phosphatase: 100 U/L (ref 39–117)
Bilirubin, Direct: 0.1 mg/dL (ref 0.1–0.3)
Total Bilirubin: 0.6 mg/dL (ref 0.2–1.2)
Total Protein: 7.7 g/dL (ref 6.0–8.3)

## 2024-02-16 LAB — IBC + FERRITIN
Ferritin: 21.4 ng/mL (ref 10.0–291.0)
Iron: 69 ug/dL (ref 42–145)
Saturation Ratios: 16.8 % — ABNORMAL LOW (ref 20.0–50.0)
TIBC: 411.6 ug/dL (ref 250.0–450.0)
Transferrin: 294 mg/dL (ref 212.0–360.0)

## 2024-03-22 ENCOUNTER — Ambulatory Visit
